# Patient Record
Sex: Female | Born: 1945 | Race: White | Hispanic: No | Smoking: Former smoker
Health system: Southern US, Community
[De-identification: ages and names within clinical notes are randomized; demographics above are authoritative.]

## PROBLEM LIST (undated history)

## (undated) DIAGNOSIS — K589 Irritable bowel syndrome without diarrhea: Secondary | ICD-10-CM

## (undated) DIAGNOSIS — F419 Anxiety disorder, unspecified: Secondary | ICD-10-CM

## (undated) DIAGNOSIS — H269 Unspecified cataract: Secondary | ICD-10-CM

## (undated) DIAGNOSIS — I452 Bifascicular block: Secondary | ICD-10-CM

## (undated) DIAGNOSIS — I471 Supraventricular tachycardia: Secondary | ICD-10-CM

## (undated) DIAGNOSIS — J329 Chronic sinusitis, unspecified: Secondary | ICD-10-CM

## (undated) DIAGNOSIS — I214 Non-ST elevation (NSTEMI) myocardial infarction: Secondary | ICD-10-CM

## (undated) DIAGNOSIS — E669 Obesity, unspecified: Secondary | ICD-10-CM

## (undated) DIAGNOSIS — F329 Major depressive disorder, single episode, unspecified: Secondary | ICD-10-CM

## (undated) DIAGNOSIS — I5181 Takotsubo syndrome: Secondary | ICD-10-CM

## (undated) DIAGNOSIS — I4719 Other supraventricular tachycardia: Secondary | ICD-10-CM

## (undated) DIAGNOSIS — E785 Hyperlipidemia, unspecified: Secondary | ICD-10-CM

## (undated) DIAGNOSIS — C50919 Malignant neoplasm of unspecified site of unspecified female breast: Secondary | ICD-10-CM

## (undated) DIAGNOSIS — I10 Essential (primary) hypertension: Secondary | ICD-10-CM

## (undated) DIAGNOSIS — K635 Polyp of colon: Secondary | ICD-10-CM

## (undated) DIAGNOSIS — F32A Depression, unspecified: Secondary | ICD-10-CM

## (undated) DIAGNOSIS — I272 Pulmonary hypertension, unspecified: Secondary | ICD-10-CM

## (undated) DIAGNOSIS — G4700 Insomnia, unspecified: Secondary | ICD-10-CM

## (undated) HISTORY — DX: Pulmonary hypertension, unspecified: I27.20

## (undated) HISTORY — DX: Hyperlipidemia, unspecified: E78.5

## (undated) HISTORY — DX: Anxiety disorder, unspecified: F41.9

## (undated) HISTORY — DX: Other supraventricular tachycardia: I47.19

## (undated) HISTORY — PX: UPPER GASTROINTESTINAL ENDOSCOPY: SHX188

## (undated) HISTORY — DX: Irritable bowel syndrome, unspecified: K58.9

## (undated) HISTORY — DX: Obesity, unspecified: E66.9

## (undated) HISTORY — PX: ABDOMINAL HYSTERECTOMY: SHX81

## (undated) HISTORY — DX: Chronic sinusitis, unspecified: J32.9

## (undated) HISTORY — PX: BREAST LUMPECTOMY: SHX2

## (undated) HISTORY — DX: Unspecified cataract: H26.9

## (undated) HISTORY — DX: Supraventricular tachycardia: I47.1

## (undated) HISTORY — PX: SHOULDER SURGERY: SHX246

## (undated) HISTORY — DX: Bifascicular block: I45.2

## (undated) HISTORY — DX: Insomnia, unspecified: G47.00

## (undated) HISTORY — DX: Takotsubo syndrome: I51.81

## (undated) HISTORY — DX: Polyp of colon: K63.5

## (undated) HISTORY — DX: Malignant neoplasm of unspecified site of unspecified female breast: C50.919

---

## 1998-09-17 ENCOUNTER — Other Ambulatory Visit: Admission: RE | Admit: 1998-09-17 | Discharge: 1998-09-17 | Payer: Self-pay | Admitting: *Deleted

## 1999-12-30 ENCOUNTER — Other Ambulatory Visit: Admission: RE | Admit: 1999-12-30 | Discharge: 1999-12-30 | Payer: Self-pay | Admitting: *Deleted

## 2000-06-16 ENCOUNTER — Ambulatory Visit (HOSPITAL_COMMUNITY): Admission: RE | Admit: 2000-06-16 | Discharge: 2000-06-16 | Payer: Self-pay | Admitting: Gastroenterology

## 2000-10-12 DIAGNOSIS — C50919 Malignant neoplasm of unspecified site of unspecified female breast: Secondary | ICD-10-CM

## 2000-10-12 HISTORY — DX: Malignant neoplasm of unspecified site of unspecified female breast: C50.919

## 2000-11-03 ENCOUNTER — Other Ambulatory Visit: Admission: RE | Admit: 2000-11-03 | Discharge: 2000-11-03 | Payer: Self-pay | Admitting: Radiology

## 2000-11-03 ENCOUNTER — Encounter (INDEPENDENT_AMBULATORY_CARE_PROVIDER_SITE_OTHER): Payer: Self-pay

## 2000-11-09 ENCOUNTER — Ambulatory Visit (HOSPITAL_BASED_OUTPATIENT_CLINIC_OR_DEPARTMENT_OTHER): Admission: RE | Admit: 2000-11-09 | Discharge: 2000-11-09 | Payer: Self-pay | Admitting: *Deleted

## 2000-11-09 ENCOUNTER — Encounter (INDEPENDENT_AMBULATORY_CARE_PROVIDER_SITE_OTHER): Payer: Self-pay | Admitting: *Deleted

## 2000-11-09 ENCOUNTER — Encounter: Admission: RE | Admit: 2000-11-09 | Discharge: 2000-11-09 | Payer: Self-pay | Admitting: *Deleted

## 2000-11-17 ENCOUNTER — Ambulatory Visit (HOSPITAL_COMMUNITY): Admission: RE | Admit: 2000-11-17 | Discharge: 2000-11-17 | Payer: Self-pay | Admitting: *Deleted

## 2000-11-19 ENCOUNTER — Ambulatory Visit (HOSPITAL_BASED_OUTPATIENT_CLINIC_OR_DEPARTMENT_OTHER): Admission: RE | Admit: 2000-11-19 | Discharge: 2000-11-19 | Payer: Self-pay | Admitting: *Deleted

## 2000-11-19 ENCOUNTER — Encounter (INDEPENDENT_AMBULATORY_CARE_PROVIDER_SITE_OTHER): Payer: Self-pay | Admitting: Specialist

## 2000-11-24 ENCOUNTER — Encounter: Admission: RE | Admit: 2000-11-24 | Discharge: 2001-02-22 | Payer: Self-pay | Admitting: Radiation Oncology

## 2001-03-07 ENCOUNTER — Ambulatory Visit: Admission: RE | Admit: 2001-03-07 | Discharge: 2001-06-05 | Payer: Self-pay | Admitting: Radiation Oncology

## 2001-06-22 ENCOUNTER — Other Ambulatory Visit: Admission: RE | Admit: 2001-06-22 | Discharge: 2001-06-22 | Payer: Self-pay | Admitting: Radiology

## 2002-10-12 DIAGNOSIS — K635 Polyp of colon: Secondary | ICD-10-CM

## 2002-10-12 HISTORY — DX: Polyp of colon: K63.5

## 2005-01-20 ENCOUNTER — Ambulatory Visit: Payer: Self-pay | Admitting: Oncology

## 2005-02-26 ENCOUNTER — Encounter: Admission: RE | Admit: 2005-02-26 | Discharge: 2005-02-26 | Payer: Self-pay | Admitting: Oncology

## 2006-01-19 ENCOUNTER — Ambulatory Visit: Payer: Self-pay | Admitting: Oncology

## 2006-01-19 LAB — COMPREHENSIVE METABOLIC PANEL
ALT: 12 U/L (ref 0–40)
AST: 18 U/L (ref 0–37)
Albumin: 4 g/dL (ref 3.5–5.2)
Alkaline Phosphatase: 67 U/L (ref 39–117)
BUN: 17 mg/dL (ref 6–23)
CO2: 28 mEq/L (ref 19–32)
Calcium: 9.6 mg/dL (ref 8.4–10.5)
Chloride: 108 mEq/L (ref 96–112)
Creatinine, Ser: 0.8 mg/dL (ref 0.4–1.2)
Glucose, Bld: 98 mg/dL (ref 70–99)
Potassium: 3.7 mEq/L (ref 3.5–5.3)
Sodium: 145 mEq/L (ref 135–145)
Total Bilirubin: 0.5 mg/dL (ref 0.3–1.2)
Total Protein: 6.6 g/dL (ref 6.0–8.3)

## 2006-01-19 LAB — CBC WITH DIFFERENTIAL/PLATELET
BASO%: 1.2 % (ref 0.0–2.0)
Basophils Absolute: 0.1 10*3/uL (ref 0.0–0.1)
EOS%: 3.5 % (ref 0.0–7.0)
Eosinophils Absolute: 0.3 10*3/uL (ref 0.0–0.5)
HCT: 39.9 % (ref 34.8–46.6)
HGB: 13.5 g/dL (ref 11.6–15.9)
LYMPH%: 28.8 % (ref 14.0–48.0)
MCH: 28.5 pg (ref 26.0–34.0)
MCHC: 33.8 g/dL (ref 32.0–36.0)
MCV: 84.5 fL (ref 81.0–101.0)
MONO#: 0.5 10*3/uL (ref 0.1–0.9)
MONO%: 6.8 % (ref 0.0–13.0)
NEUT#: 4.8 10*3/uL (ref 1.5–6.5)
NEUT%: 59.7 % (ref 39.6–76.8)
Platelets: 342 10*3/uL (ref 145–400)
RBC: 4.72 10*6/uL (ref 3.70–5.32)
RDW: 13.4 % (ref 11.3–14.5)
WBC: 8 10*3/uL (ref 3.9–10.0)
lymph#: 2.3 10*3/uL (ref 0.9–3.3)

## 2007-01-13 ENCOUNTER — Ambulatory Visit: Payer: Self-pay | Admitting: Oncology

## 2007-01-18 LAB — CBC WITH DIFFERENTIAL/PLATELET
BASO%: 0.4 % (ref 0.0–2.0)
Basophils Absolute: 0 10*3/uL (ref 0.0–0.1)
EOS%: 4.3 % (ref 0.0–7.0)
Eosinophils Absolute: 0.3 10*3/uL (ref 0.0–0.5)
HCT: 38.7 % (ref 34.8–46.6)
HGB: 13.5 g/dL (ref 11.6–15.9)
LYMPH%: 36.6 % (ref 14.0–48.0)
MCH: 28.7 pg (ref 26.0–34.0)
MCHC: 34.8 g/dL (ref 32.0–36.0)
MCV: 82.6 fL (ref 81.0–101.0)
MONO#: 0.5 10*3/uL (ref 0.1–0.9)
MONO%: 6.1 % (ref 0.0–13.0)
NEUT#: 4.1 10*3/uL (ref 1.5–6.5)
NEUT%: 52.6 % (ref 39.6–76.8)
Platelets: 309 10*3/uL (ref 145–400)
RBC: 4.69 10*6/uL (ref 3.70–5.32)
RDW: 13.2 % (ref 11.3–14.5)
WBC: 7.9 10*3/uL (ref 3.9–10.0)
lymph#: 2.9 10*3/uL (ref 0.9–3.3)

## 2007-01-18 LAB — COMPREHENSIVE METABOLIC PANEL
ALT: 15 U/L (ref 0–35)
AST: 18 U/L (ref 0–37)
Albumin: 3.8 g/dL (ref 3.5–5.2)
Alkaline Phosphatase: 70 U/L (ref 39–117)
BUN: 21 mg/dL (ref 6–23)
CO2: 27 mEq/L (ref 19–32)
Calcium: 9 mg/dL (ref 8.4–10.5)
Chloride: 107 mEq/L (ref 96–112)
Creatinine, Ser: 0.85 mg/dL (ref 0.40–1.20)
Glucose, Bld: 99 mg/dL (ref 70–99)
Potassium: 3.7 mEq/L (ref 3.5–5.3)
Sodium: 144 mEq/L (ref 135–145)
Total Bilirubin: 0.5 mg/dL (ref 0.3–1.2)
Total Protein: 6 g/dL (ref 6.0–8.3)

## 2007-01-18 LAB — CANCER ANTIGEN 27.29: CA 27.29: 30 U/mL (ref 0–39)

## 2007-09-06 ENCOUNTER — Ambulatory Visit: Payer: Self-pay | Admitting: Gastroenterology

## 2007-09-12 ENCOUNTER — Ambulatory Visit: Payer: Self-pay | Admitting: Gastroenterology

## 2010-08-11 ENCOUNTER — Encounter (INDEPENDENT_AMBULATORY_CARE_PROVIDER_SITE_OTHER): Payer: Self-pay | Admitting: *Deleted

## 2010-09-01 ENCOUNTER — Telehealth: Payer: Self-pay | Admitting: Gastroenterology

## 2010-09-01 ENCOUNTER — Encounter (INDEPENDENT_AMBULATORY_CARE_PROVIDER_SITE_OTHER): Payer: Self-pay

## 2010-09-02 ENCOUNTER — Ambulatory Visit: Payer: Self-pay | Admitting: Gastroenterology

## 2010-11-04 ENCOUNTER — Ambulatory Visit
Admission: RE | Admit: 2010-11-04 | Discharge: 2010-11-04 | Payer: Self-pay | Source: Home / Self Care | Attending: Gastroenterology | Admitting: Gastroenterology

## 2010-11-04 ENCOUNTER — Encounter: Payer: Self-pay | Admitting: Gastroenterology

## 2010-11-13 NOTE — Letter (Signed)
Summary: [facility] Instructions  [facility]  [address] [place], [place] [address]   Phone: [phone]  Fax: [phone]       Valerie Nash    04/03/1946    MRN: [id number]        Procedure Day /Date: Monday 09-22-10     Arrival Time: 10:30 a.m.     Procedure Time: 11:30 a.m.     Location of Procedure:                    _x _  [facility] (4th Floor)   PREPARATION FOR COLONOSCOPY WITH MOVIPREP   Starting 5 days prior to your procedure  09-17-10  do not eat nuts, seeds, popcorn, corn, beans, peas,  salads, or any raw vegetables.  Do not take any fiber supplements (e.g. Metamucil, Citrucel, and Benefiber).  THE DAY BEFORE YOUR PROCEDURE         DATE:  09-21-10  DAY:  Sunday  1.  Drink clear liquids the entire day-NO SOLID FOOD  2.  Do not drink anything colored red or purple.  Avoid juices with pulp.  No orange juice.  3.  Drink at least 64 oz. (8 glasses) of fluid/clear liquids during the day to prevent dehydration and help the prep work efficiently.  CLEAR LIQUIDS INCLUDE: Water Jello Ice Popsicles Tea (sugar ok, no milk/cream) Powdered fruit flavored drinks Coffee (sugar ok, no milk/cream) Gatorade Juice: apple, white grape, white cranberry  Lemonade Clear bullion, consomm, broth Carbonated beverages (any kind) Strained chicken noodle soup Hard Candy                             4.  In the morning, mix first dose of MoviPrep solution:    Empty 1 Pouch A and 1 Pouch B into the disposable container    Add lukewarm drinking water to the top line of the container. Mix to dissolve    Refrigerate (mixed solution should be used within 24 hrs)  5.  Begin drinking the prep at 5:00 p.m. The MoviPrep container is divided by 4 marks.   Every 15 minutes drink the solution down to the next mark (approximately 8 oz) until the full liter is complete.   6.  Follow completed prep with 16 oz of clear liquid of your choice (Nothing red or  purple).  Continue to drink clear liquids until bedtime.  7.  Before going to bed, mix second dose of MoviPrep solution:    Empty 1 Pouch A and 1 Pouch B into the disposable container    Add lukewarm drinking water to the top line of the container. Mix to dissolve    Refrigerate  THE DAY OF YOUR PROCEDURE      DATE:  09-22-10  DAY:  Monday  Beginning at  6:30 a.m. (5 hours before procedure):         1. Every 15 minutes, drink the solution down to the next mark (approx 8 oz) until the full liter is complete.  2. Follow completed prep with 16 oz. of clear liquid of your choice.    3. You may drink clear liquids until  9:30 a.m. (2 HOURS BEFORE PROCEDURE).   MEDICATION INSTRUCTIONS  Unless otherwise instructed, you should take regular prescription medications with a small sip of water   as early as possible the morning of your procedure.  Additional medication instructions: Do not take Benicar am of  procedure.         OTHER INSTRUCTIONS  You will need a responsible adult at least 65 years of age to accompany you and drive you home.   This person must remain in the waiting room during your procedure.  Wear loose fitting clothing that is easily removed.  Leave jewelry and other valuables at home.  However, you may wish to bring a book to read or  an iPod/MP3 player to listen to music as you wait for your procedure to start.  Remove all body piercing jewelry and leave at home.  Total time from sign-in until discharge is approximately 2-3 hours.  You should go home directly after your procedure and rest.  You can resume normal activities the  day after your procedure.  The day of your procedure you should not:   Drive   Make legal decisions   Operate machinery   Drink alcohol   Return to work  You will receive specific instructions about eating, activities and medications before you leave.    The above instructions have been reviewed and explained to me by    Ulis Rias RN  September 02, 2010 2:11 PM     I fully understand and can verbalize these instructions _____________________________ Date _________

## 2010-11-13 NOTE — Letter (Signed)
Summary: Pre Visit Letter Revised  [facility]  [address] [place], [place] [address]   Phone: [phone]  Fax: [phone]        08/11/2010 MRN: [id number] [facility] [address] [place], [place]  [address]             Procedure Date:  09/22/2010   Welcome to the Gastroenterology Division at [facility].    You are scheduled to see a nurse for your pre-procedure visit on 09/02/2010 at 2:00PM on the 3rd floor at [facility], [address]  We ask that you try to arrive at our office 15 minutes prior to your appointment time to allow for check-in.  Please take a minute to review the attached form.  If you answer "Yes" to one or more of the questions on the first page, we ask that you call the person listed at your earliest opportunity.  If you answer "No" to all of the questions, please complete the rest of the form and bring it to your appointment.    Your nurse visit will consist of discussing your medical and surgical history, your immediate family medical history, and your medications.   If you are unable to list all of your medications on the form, please bring the medication bottles to your appointment and we will list them.  We will need to be aware of both prescribed and over the counter drugs.  We will need to know exact dosage information as well.    Please be prepared to read and sign documents such as consent forms, a financial agreement, and acknowledgement forms.  If necessary, and with your consent, a friend or relative is welcome to sit-in on the nurse visit with you.  Please bring your insurance card so that we may make a copy of it.  If your insurance requires a referral to see a specialist, please bring your referral form from your primary care physician.  No co-pay is required for this nurse visit.     If you cannot keep your appointment, please call [phone] to cancel or reschedule prior to your appointment date.  This  allows [place] the opportunity to schedule an appointment for another patient in need of care.    Thank you for choosing [facility] for your medical needs.  We appreciate the opportunity to care for you.  Please visit [place] at our website  to learn more about our practice.  Sincerely, The Gastroenterology Division

## 2010-11-13 NOTE — Progress Notes (Signed)
Summary: ? Need for additional prep  Phone Note Call from Patient   Summary of Call: Patient for previsit tomorrow for her colonoscopy on 09-22-10, per 2008 report she had osmo prep with fair results, you recommended repeat colon in 2011 with better bowel prep. She will recieve movi prep instructions, do you want anything else added? Initial call taken by: Sherren Kerns RN,  September 01, 2010 12:24 PM  Follow-up for Phone Call        MoviPrep should be adequate. Follow-up by: Meryl Dare MD Clementeen Graham,  September 01, 2010 1:59 PM

## 2010-11-13 NOTE — Miscellaneous (Signed)
Summary: Lec previsit  Clinical Lists Changes  Medications: Added new medication of MOVIPREP 100 GM  SOLR (PEG-KCL-NACL-NASULF-NA ASC-C) As per prep instructions. - Signed Rx of MOVIPREP 100 GM  SOLR (PEG-KCL-NACL-NASULF-NA ASC-C) As per prep instructions.;  #1 x 0;  Signed;  Entered by: Ulis Rias RN;  Authorized by: Meryl Dare MD [facility];  Method used: Electronically to [facility] [phone]*, [address] [place], [place], [place]  [address], Ph: [phone], Fax: [phone] Observations: Added new observation of NKA: T (09/02/2010 13:49)    Prescriptions: MOVIPREP 100 GM  SOLR (PEG-KCL-NACL-NASULF-NA ASC-C) As per prep instructions.  #1 x 0   Entered by:   Ulis Rias RN   Authorized by:   Meryl Dare MD [facility]   Signed by:   Ulis Rias RN on 09/02/2010   Method used:   Electronically to        [facility] [phone][facility] (retail)       [address]       [place], [place]  [address]       Ph: [phone]       Fax: [phone]   RxID:   [id number]

## 2010-11-13 NOTE — Procedures (Signed)
Summary: Colonoscopy  Patient: Valerie Nash Note: All result statuses are Final unless otherwise noted.  Tests: (1) Colonoscopy (COL)   COL Colonoscopy           DONE     [facility]     [address]     [place], [place]  [address]           COLONOSCOPY PROCEDURE REPORT           PATIENT:  Valerie Nash, Valerie Nash  MR#:  [id number]     BIRTHDATE:  1946-02-04, 64 yrs. old  GENDER:  female     ENDOSCOPIST:  Judie Petit T. Russella Dar, MD, [facility]           PROCEDURE DATE:  11/04/2010     PROCEDURE:  Colonoscopy [address]     ASA CLASS:  Class II     INDICATIONS:  1) surveillance and high-risk screening  2) history     of adenomatous colon polyps-06/2003  3) family history of colon     cancer-father at 18.     MEDICATIONS:   Fentanyl 75 mcg IV, Versed 8 mg IV     DESCRIPTION OF PROCEDURE:   After the risks benefits and     alternatives of the procedure were thoroughly explained, informed     consent was obtained.  Digital rectal exam was performed and     revealed no abnormalities.   The LB PCF-H180AL [id number] endoscope     was introduced through the anus and advanced to the cecum, which     was identified by both the appendix and ileocecal valve, without     limitations.  The quality of the prep was good, using MoviPrep.     The instrument was then slowly withdrawn as the colon was fully     examined.     <<PROCEDUREIMAGES>>     FINDINGS:  A normal appearing cecum, ileocecal valve, and     appendiceal orifice were identified. The ascending, hepatic     flexure, transverse, splenic flexure, descending, sigmoid colon,     and rectum appeared unremarkable. Retroflexed views in the rectum     revealed no abnormalities. The time to cecum =  5.75  minutes. The     scope was then withdrawn (time =  10.5  min) from the patient and     the procedure completed.           COMPLICATIONS:  None           ENDOSCOPIC IMPRESSION:     1) Normal colon           RECOMMENDATIONS:     1) Repeat Colonoscopy in 5  years.           Venita Lick. Russella Dar, MD, [facility]           CC:  R. Robley Fries, MD           n.     Rosalie DoctorJudie Petit T. Kairee Kozma at 11/04/2010 03:30 PM           Gethers, Windell Moulding, [phone]  Note: An exclamation mark (!) indicates a result that was not dispersed into the flowsheet. Document Creation Date: 11/04/2010 3:31 PM _______________________________________________________________________  (1) Order result status: Final Collection or observation date-time: 11/04/2010 15:23 Requested date-time:  Receipt date-time:  Reported date-time:  Referring Physician:   Ordering Physician: Claudette Head [phone]) Specimen Source:  Source: Launa Grill Order Number: [id number] Lab site:   Appended Document: Colonoscopy  Clinical Lists Changes  Observations: Added new observation of COLONNXTDUE: 10/2015 (11/04/2010 16:04)

## 2011-02-27 NOTE — Op Note (Signed)
[facility]  Patient:    Valerie Nash, Valerie Nash                        MRN: [id number] Proc. Date: 11/19/00 Adm. Date:  [id number] Disc. Date: [id number] Attending:  Stephenie Acres                           Operative Report  PREOPERATIVE DIAGNOSIS:  Ductal carcinoma in situ of the right breast, status post excision with positive margins.  POSTOPERATIVE DIAGNOSIS:  Ductal carcinoma in situ of the right breast, status post excision with positive margins.  PROCEDURE:  Re-excision of right partial mastectomy.  ANESTHESIA:  General.  SURGEON:  Catalina Lunger, M.D.  DESCRIPTION OF PROCEDURE:  The patient was taken to the operating room and placed in the supine position.  After adequate anesthesia was induced using laryngeal mask, the right breast was prepped and draped in the normal sterile fashion.  Using an elliptical incision around the previous incision, I dissected down through the dermis and the subcutaneous tissue.  My dissection began laterally out far beyond the biopsy cavity, approximately 1 cm.  The biopsy cavity was then entered and a portion of the superolateral and inferolateral margins were also excised en bloc.  This entire specimen was sent for pathologic evaluation.  Adequate hemostasis was ensured and the skin was closed with subcuticular 4-0 Monocryl.  Steri-Strips and a sterile dressing was applied.  The patient tolerated the procedure well and went to PACU in good condition. DD:  11/19/00 TD:  11/21/00 Job: [id number]

## 2011-02-27 NOTE — Procedures (Signed)
[facility]  Patient:    Valerie Nash, Valerie Nash                        MRN: [id number] Proc. Date: 06/16/00 Adm. Date:  [id number] Attending:  Starr Sinclair CC:         Almedia Balls. Randell Patient, M.D.   Procedure Report  PROCEDURE:  Colonoscopy.  ENDOSCOPIST:  Venita Lick. Pleas Koch., M.D.  INDICATIONS:  This is a 66 year old white female with a prior history of an adenomatous colon polyp and a father who developed colon cancer.  She has no ongoing colorectal complaints and returns for surveillance colonoscopy.  PHYSICAL EXAMINATION:  Chest:  Clear to auscultation and percussion.  Cardiac: Regular rate and rhythm without murmurs.  Neurologic:  Alert and oriented x3.  ANESTHESIA:  Fentanyl 75 mcg IV, Versed 7 mg IV.  MONITORING:  Automated blood pressure monitor, pulse oximeter and cardiac monitor.  Low-flow oxygen was given by nasal cannula throughout the procedure. The procedure was well tolerated with no immediate complications.  DESCRIPTION OF PROCEDURE:  After the nature of the procedure was discussed with the patient including discussion of its risks, benefits and alternatives, she consented to proceed.  She was then comfortably sedated in the left lateral decubitus position.  Digital rectal examination was unremarkable.  The Olympus pediatric video colonoscope was inserted in the rectal vault.  Area was insufflated and the colonoscope was advanced to the cecum.  The cecum was identified by the appendiceal orifice and the ileocecal valve orifice.  There were multiple areas of retained turbid liquid stool in the colon.  The bowel preparation was overall fair.  Most of these areas were adequately suctioned. On slow withdrawal of the colonoscope, the visualize portions cecum, ascending colon, hepatic flexure, transverse colon, splenic flexure,  and descending colon were unremarkable.  In the sigmoid colon, there were several small diverticula noted.  In the  rectum, small internal hemorrhoids were noted on retroflexed view.  The proximal rectum was normal.  The colon was decompressed and colonoscope was withdrawn from the patient.  IMPRESSION: 1. Colonoscopy to cecum. 2. Mild sigmoid colon diverticulosis. 3. Small internal hemorrhoids. 4. Fair bowel preparation.  RECOMMENDATIONS: 1. Followup colonoscopy in five years. 2. Ongoing followup with Dr. Jeanine Luz. DD:  06/16/00 TD:  06/16/00 Job: [id number]

## 2011-02-27 NOTE — Op Note (Signed)
. [facility]  Patient:    Valerie Nash, Valerie Nash                        MRN: [id number] Proc. Date: 11/09/00 Adm. Date:  [id number] Attending:  Stephenie Acres                           Operative Report  PREOPERATIVE DIAGNOSIS:  Ductal carcinoma in situ of the right breast.  POSTOPERATIVE DIAGNOSIS:  Ductal carcinoma in situ of the right breast.  PROCEDURE:  Needle-localization right partial mastectomy.  SURGEON:  Catalina Lunger, M.D.  ANESTHESIA:  General.  DESCRIPTION OF PROCEDURE:  After patient underwent needle localization of calcification in the right breast, she was brought to the operating room and given general anesthesia via endotracheal tube.  Her right breast was then prepped and draped in the normal sterile fashion.  Using a curvilinear incision right at the periareolar region from 3 to 7 oclock, dissected down through subcutaneous tissue until I easily could palpate the tip of the wire. All tissue surrounding the wire was taken back to its entrance through the skin.  This was removed en bloc, sent for specimen mammography, which verified the presence of the calcifications.  All margins had been marked.  Tissues were injected using Marcaine.  Skin was closed using subcuticular 4-0 Monocryl.  Steri-Strips and sterile dressing was applied.  The patient tolerated the procedure well, went to PACU in good condition. DD:  11/09/00 TD:  11/09/00 Job: [id number]

## 2012-01-19 DIAGNOSIS — G2581 Restless legs syndrome: Secondary | ICD-10-CM | POA: Diagnosis not present

## 2012-01-19 DIAGNOSIS — F411 Generalized anxiety disorder: Secondary | ICD-10-CM | POA: Diagnosis not present

## 2012-01-19 DIAGNOSIS — E669 Obesity, unspecified: Secondary | ICD-10-CM | POA: Diagnosis not present

## 2012-01-19 DIAGNOSIS — Z0000 Encounter for general adult medical examination without abnormal findings: Secondary | ICD-10-CM | POA: Diagnosis not present

## 2012-01-19 DIAGNOSIS — Z79899 Other long term (current) drug therapy: Secondary | ICD-10-CM | POA: Diagnosis not present

## 2012-01-19 DIAGNOSIS — J019 Acute sinusitis, unspecified: Secondary | ICD-10-CM | POA: Diagnosis not present

## 2012-01-19 DIAGNOSIS — I10 Essential (primary) hypertension: Secondary | ICD-10-CM | POA: Diagnosis not present

## 2012-01-19 DIAGNOSIS — G4700 Insomnia, unspecified: Secondary | ICD-10-CM | POA: Diagnosis not present

## 2012-02-03 DIAGNOSIS — E8941 Symptomatic postprocedural ovarian failure: Secondary | ICD-10-CM | POA: Diagnosis not present

## 2012-02-03 DIAGNOSIS — Z79899 Other long term (current) drug therapy: Secondary | ICD-10-CM | POA: Diagnosis not present

## 2012-03-14 DIAGNOSIS — Z1231 Encounter for screening mammogram for malignant neoplasm of breast: Secondary | ICD-10-CM | POA: Diagnosis not present

## 2012-03-22 DIAGNOSIS — Z85828 Personal history of other malignant neoplasm of skin: Secondary | ICD-10-CM | POA: Diagnosis not present

## 2012-03-22 DIAGNOSIS — C44711 Basal cell carcinoma of skin of unspecified lower limb, including hip: Secondary | ICD-10-CM | POA: Diagnosis not present

## 2012-03-22 DIAGNOSIS — Z808 Family history of malignant neoplasm of other organs or systems: Secondary | ICD-10-CM | POA: Diagnosis not present

## 2012-03-22 DIAGNOSIS — D239 Other benign neoplasm of skin, unspecified: Secondary | ICD-10-CM | POA: Diagnosis not present

## 2012-03-22 DIAGNOSIS — L819 Disorder of pigmentation, unspecified: Secondary | ICD-10-CM | POA: Diagnosis not present

## 2012-03-22 DIAGNOSIS — L821 Other seborrheic keratosis: Secondary | ICD-10-CM | POA: Diagnosis not present

## 2012-03-22 DIAGNOSIS — D485 Neoplasm of uncertain behavior of skin: Secondary | ICD-10-CM | POA: Diagnosis not present

## 2012-03-22 DIAGNOSIS — D1801 Hemangioma of skin and subcutaneous tissue: Secondary | ICD-10-CM | POA: Diagnosis not present

## 2012-04-19 DIAGNOSIS — C44711 Basal cell carcinoma of skin of unspecified lower limb, including hip: Secondary | ICD-10-CM | POA: Diagnosis not present

## 2012-08-11 DIAGNOSIS — S82009A Unspecified fracture of unspecified patella, initial encounter for closed fracture: Secondary | ICD-10-CM | POA: Diagnosis not present

## 2012-08-24 DIAGNOSIS — S82009A Unspecified fracture of unspecified patella, initial encounter for closed fracture: Secondary | ICD-10-CM | POA: Diagnosis not present

## 2012-09-26 DIAGNOSIS — S82009A Unspecified fracture of unspecified patella, initial encounter for closed fracture: Secondary | ICD-10-CM | POA: Diagnosis not present

## 2012-12-10 DIAGNOSIS — I5181 Takotsubo syndrome: Secondary | ICD-10-CM

## 2012-12-10 HISTORY — DX: Takotsubo syndrome: I51.81

## 2012-12-23 ENCOUNTER — Encounter (HOSPITAL_BASED_OUTPATIENT_CLINIC_OR_DEPARTMENT_OTHER): Payer: Self-pay | Admitting: *Deleted

## 2012-12-23 ENCOUNTER — Inpatient Hospital Stay (HOSPITAL_BASED_OUTPATIENT_CLINIC_OR_DEPARTMENT_OTHER)
Admission: EM | Admit: 2012-12-23 | Discharge: 2012-12-26 | DRG: 281 | Disposition: A | Discharge status: Home / Self Care | Payer: Medicare Other | Attending: Cardiology | Admitting: Cardiology

## 2012-12-23 ENCOUNTER — Emergency Department (HOSPITAL_BASED_OUTPATIENT_CLINIC_OR_DEPARTMENT_OTHER): Payer: Medicare Other

## 2012-12-23 DIAGNOSIS — I951 Orthostatic hypotension: Secondary | ICD-10-CM | POA: Diagnosis not present

## 2012-12-23 DIAGNOSIS — R072 Precordial pain: Secondary | ICD-10-CM | POA: Diagnosis not present

## 2012-12-23 DIAGNOSIS — I5181 Takotsubo syndrome: Secondary | ICD-10-CM | POA: Diagnosis not present

## 2012-12-23 DIAGNOSIS — I10 Essential (primary) hypertension: Secondary | ICD-10-CM | POA: Diagnosis present

## 2012-12-23 DIAGNOSIS — F3289 Other specified depressive episodes: Secondary | ICD-10-CM | POA: Diagnosis present

## 2012-12-23 DIAGNOSIS — Z87891 Personal history of nicotine dependence: Secondary | ICD-10-CM | POA: Diagnosis not present

## 2012-12-23 DIAGNOSIS — I214 Non-ST elevation (NSTEMI) myocardial infarction: Secondary | ICD-10-CM | POA: Diagnosis not present

## 2012-12-23 DIAGNOSIS — I4510 Unspecified right bundle-branch block: Secondary | ICD-10-CM | POA: Diagnosis present

## 2012-12-23 DIAGNOSIS — R079 Chest pain, unspecified: Secondary | ICD-10-CM | POA: Diagnosis not present

## 2012-12-23 DIAGNOSIS — F329 Major depressive disorder, single episode, unspecified: Secondary | ICD-10-CM | POA: Diagnosis present

## 2012-12-23 HISTORY — DX: Depression, unspecified: F32.A

## 2012-12-23 HISTORY — DX: Major depressive disorder, single episode, unspecified: F32.9

## 2012-12-23 HISTORY — DX: Essential (primary) hypertension: I10

## 2012-12-23 LAB — BASIC METABOLIC PANEL
BUN: 14 mg/dL (ref 6–23)
CO2: 25 mEq/L (ref 19–32)
Calcium: 11.3 mg/dL — ABNORMAL HIGH (ref 8.4–10.5)
Chloride: 103 mEq/L (ref 96–112)
Creatinine, Ser: 0.7 mg/dL (ref 0.50–1.10)
GFR calc Af Amer: >90 mL/min (ref >90)
GFR calc non Af Amer: 88 mL/min — ABNORMAL LOW (ref >90)
Glucose, Bld: 127 mg/dL — ABNORMAL HIGH (ref 70–99)
Potassium: 2.9 mEq/L — ABNORMAL LOW (ref 3.5–5.1)
Sodium: 141 mEq/L (ref 135–145)

## 2012-12-23 LAB — CK TOTAL AND CKMB (NOT AT ARMC)
CK, MB: 6.4 ng/mL (ref 0.3–4.0)
Relative Index: 5.9 — ABNORMAL HIGH (ref 0.0–2.5)
Total CK: 109 U/L (ref 7–177)

## 2012-12-23 LAB — CBC WITH DIFFERENTIAL/PLATELET
Basophils Absolute: 0 10*3/uL (ref 0.0–0.1)
Basophils Relative: 0 % (ref 0–1)
Eosinophils Absolute: 0.1 10*3/uL (ref 0.0–0.7)
Eosinophils Relative: 1 % (ref 0–5)
HCT: 45 % (ref 36.0–46.0)
Hemoglobin: 15.5 g/dL — ABNORMAL HIGH (ref 12.0–15.0)
Lymphocytes Relative: 26 % (ref 12–46)
Lymphs Abs: 3 10*3/uL (ref 0.7–4.0)
MCH: 28.1 pg (ref 26.0–34.0)
MCHC: 34.4 g/dL (ref 30.0–36.0)
MCV: 81.7 fL (ref 78.0–100.0)
Monocytes Absolute: 0.7 10*3/uL (ref 0.1–1.0)
Monocytes Relative: 6 % (ref 3–12)
Neutro Abs: 7.6 10*3/uL (ref 1.7–7.7)
Neutrophils Relative %: 66 % (ref 43–77)
Platelets: 329 10*3/uL (ref 150–400)
RBC: 5.51 MIL/uL — ABNORMAL HIGH (ref 3.87–5.11)
RDW: 14 % (ref 11.5–15.5)
WBC: 11.5 10*3/uL — ABNORMAL HIGH (ref 4.0–10.5)

## 2012-12-23 LAB — URINALYSIS, ROUTINE W REFLEX MICROSCOPIC
Bilirubin Urine: NEGATIVE
Glucose, UA: NEGATIVE mg/dL
Ketones, ur: NEGATIVE mg/dL
Leukocytes, UA: NEGATIVE
Nitrite: NEGATIVE
Protein, ur: NEGATIVE mg/dL
Specific Gravity, Urine: 1.016 (ref 1.005–1.030)
Urobilinogen, UA: 0.2 mg/dL (ref 0.0–1.0)
pH: 6.5 (ref 5.0–8.0)

## 2012-12-23 LAB — URINE MICROSCOPIC-ADD ON

## 2012-12-23 LAB — TROPONIN I
Troponin I: 0.9 ng/mL (ref <0.30)
Troponin I: 0.9 ng/mL (ref <0.30)
Troponin I: 0.71 ng/mL (ref <0.30)

## 2012-12-23 MED ORDER — CLOPIDOGREL BISULFATE 300 MG PO TABS
600.0000 mg | ORAL_TABLET | Freq: Once | ORAL | Status: AC
  Administered 2012-12-23: 600 mg via ORAL
  Filled 2012-12-23: qty 2

## 2012-12-23 MED ORDER — ASPIRIN EC 81 MG PO TBEC
81.0000 mg | DELAYED_RELEASE_TABLET | Freq: Every day | ORAL | Status: DC
  Administered 2012-12-24 – 2012-12-25 (×2): 81 mg via ORAL
  Filled 2012-12-23 (×3): qty 1

## 2012-12-23 MED ORDER — NITROGLYCERIN 0.4 MG SL SUBL: 0.4000 mg | SUBLINGUAL_TABLET | SUBLINGUAL | Status: DC | PRN

## 2012-12-23 MED ORDER — HEPARIN (PORCINE) IN NACL 100-0.45 UNIT/ML-% IJ SOLN: 1000.0000 [IU]/h | INTRAMUSCULAR | Status: DC

## 2012-12-23 MED ORDER — SODIUM CHLORIDE 0.9 % IV SOLN
Freq: Once | INTRAVENOUS | Status: AC
  Administered 2012-12-23: 18:00:00 via INTRAVENOUS

## 2012-12-23 MED ORDER — ACETAMINOPHEN 325 MG PO TABS
650.0000 mg | ORAL_TABLET | ORAL | Status: DC | PRN
  Administered 2012-12-24 (×2): 650 mg via ORAL
  Filled 2012-12-23 (×2): qty 2

## 2012-12-23 MED ORDER — HEPARIN (PORCINE) IN NACL 100-0.45 UNIT/ML-% IJ SOLN
900.0000 [IU]/h | INTRAMUSCULAR | Status: DC
  Administered 2012-12-23: 1000 [IU]/h via INTRAVENOUS
  Administered 2012-12-24 – 2012-12-25 (×3): 900 [IU]/h via INTRAVENOUS
  Filled 2012-12-23 (×5): qty 250

## 2012-12-23 MED ORDER — ONDANSETRON HCL 4 MG/2ML IJ SOLN: 4.0000 mg | Freq: Four times a day (QID) | INTRAMUSCULAR | Status: DC | PRN

## 2012-12-23 MED ORDER — NITROGLYCERIN IN D5W 200-5 MCG/ML-% IV SOLN: 5.0000 ug/min | Freq: Once | INTRAVENOUS | Status: DC

## 2012-12-23 MED ORDER — SODIUM CHLORIDE 0.9 % IV SOLN: INTRAVENOUS | Status: AC

## 2012-12-23 MED ORDER — METOPROLOL TARTRATE 12.5 MG HALF TABLET
12.5000 mg | ORAL_TABLET | Freq: Two times a day (BID) | ORAL | Status: DC
  Administered 2012-12-23: 12.5 mg via ORAL
  Filled 2012-12-23 (×3): qty 1

## 2012-12-23 MED ORDER — ASPIRIN 81 MG PO CHEW
324.0000 mg | CHEWABLE_TABLET | Freq: Once | ORAL | Status: AC
  Administered 2012-12-23: 324 mg via ORAL
  Filled 2012-12-23: qty 4

## 2012-12-23 MED ORDER — NITROGLYCERIN IN D5W 200-5 MCG/ML-% IV SOLN
5.0000 ug/min | INTRAVENOUS | Status: DC
Start: 2012-12-23 — End: 2012-12-23
  Administered 2012-12-23: 5 ug/min via INTRAVENOUS
  Filled 2012-12-23: qty 250

## 2012-12-23 MED ORDER — ATORVASTATIN CALCIUM 80 MG PO TABS
80.0000 mg | ORAL_TABLET | Freq: Every day | ORAL | Status: DC
  Filled 2012-12-23 (×3): qty 1

## 2012-12-23 MED ORDER — HEPARIN BOLUS VIA INFUSION
4000.0000 [IU] | Freq: Once | INTRAVENOUS | Status: AC
  Administered 2012-12-23: 4000 [IU] via INTRAVENOUS

## 2012-12-23 MED ORDER — SODIUM CHLORIDE 0.9 % IV SOLN
INTRAVENOUS | Status: AC
  Administered 2012-12-24: 50 mL/h via INTRAVENOUS

## 2012-12-23 MED ORDER — VENLAFAXINE HCL 75 MG PO TABS
75.0000 mg | ORAL_TABLET | Freq: Every day | ORAL | Status: DC
  Administered 2012-12-24 – 2012-12-25 (×2): 75 mg via ORAL
  Filled 2012-12-23 (×3): qty 1

## 2012-12-23 MED ORDER — CLOPIDOGREL BISULFATE 75 MG PO TABS
75.0000 mg | ORAL_TABLET | Freq: Every day | ORAL | Status: DC
  Administered 2012-12-24 – 2012-12-26 (×3): 75 mg via ORAL
  Filled 2012-12-23 (×4): qty 1

## 2012-12-23 NOTE — ED Notes (Signed)
Report given to [facility], RN [facility] Room 2021.

## 2012-12-23 NOTE — ED Provider Notes (Signed)
History     CSN: [id number]  Arrival date & time 12/23/12  1555   First MD Initiated Contact with Patient 12/23/12 1613      Chief Complaint  Patient presents with  . Chest Pain    (Consider location/radiation/quality/duration/timing/severity/associated sxs/prior treatment) Patient is a 67 y.o. female presenting with chest pain. The history is provided by the patient. No language interpreter was used.  Chest Pain Pain location:  Substernal area (Pt went to an water exercise class yesterday evening and had onset of pain in the precordial region.  It is like a pressure, rated at a 3 in severity.  It is somewhat worse when she lies down.  The pain persisted all night and through the day,.) Pain quality: pressure   Pain radiates to:  Does not radiate Pain severity:  Mild Onset quality:  Sudden Duration: Steadily present since last evening. Timing:  Constant Progression:  Unchanged Chronicity:  New Context comment:  Came on during a water exercise class. Relieved by:  Nothing Worsened by:  Nothing tried Ineffective treatments:  None tried Associated symptoms: no fever, no palpitations and no shortness of breath   Risk factors: hypertension   Risk factors comment:  She has a known right bundle branch block.   Past Medical History  Diagnosis Date  . Depression   . Hypertension   . BBB (bundle branch block)   . Cancer     Past Surgical History  Procedure Laterality Date  . Shoulder surgery    . Abdominal hysterectomy    . Cesarean section    . Breast lumpectomy      No family history on file.  History  Substance Use Topics  . Smoking status: Former Games developer  . Smokeless tobacco: Not on file  . Alcohol Use: Yes    OB History   Grav Para Term Preterm Abortions TAB SAB Ect Mult Living                  Review of Systems  Constitutional: Negative.  Negative for fever and chills.  HENT: Negative.   Eyes: Negative.   Respiratory: Negative.  Negative for shortness  of breath.   Cardiovascular: Positive for chest pain. Negative for palpitations and leg swelling.  Genitourinary: Negative.   Musculoskeletal: Negative.   Skin: Negative.   Neurological: Negative.   Psychiatric/Behavioral: Negative.     Allergies  Review of patient's allergies indicates no known allergies.  Home Medications   Current Outpatient Rx  Name  Route  Sig  Dispense  Refill  . olmesartan-hydrochlorothiazide (BENICAR HCT) 20-12.5 MG per tablet   Oral   Take 1 tablet by mouth daily.         Marland Kitchen venlafaxine (EFFEXOR) 75 MG tablet   Oral   Take 75 mg by mouth 2 (two) times daily.           BP 133/80  Pulse 104  Temp(Src) 98.1 F (36.7 C) (Oral)  Resp 22  Wt 155 lb (70.308 kg)  SpO2 100%  Physical Exam  Nursing note and vitals reviewed. Constitutional: She is oriented to person, place, and time. She appears well-developed and well-nourished. No distress.  Appears mildly anxious.  HENT:  Head: Normocephalic and atraumatic.  Right Ear: External ear normal.  Left Ear: External ear normal.  Mouth/Throat: Oropharynx is clear and moist.  Eyes: Conjunctivae and EOM are normal. Pupils are equal, round, and reactive to light.  Neck: Normal range of motion. Neck supple.  Cardiovascular: Normal rate,  regular rhythm and normal heart sounds.   Pulmonary/Chest: Effort normal and breath sounds normal.  Abdominal: Soft. Bowel sounds are normal.  Musculoskeletal: Normal range of motion. She exhibits no edema and no tenderness.  Neurological: She is alert and oriented to person, place, and time.  No sensory or motor deficit.  Skin: Skin is warm and dry.  Psychiatric: She has a normal mood and affect. Her behavior is normal.    ED Course  Procedures (including critical care time)  4:20 PM  Date: 12/23/2012  Rate: 85  Rhythm: normal sinus rhythm  QRS Axis: left  Intervals: normal  ST/T Wave abnormalities:  T waves inverted in medial anterior precordial leads.   Conduction Disutrbances:right bundle branch block and left anterior fascicular block  Narrative Interpretation: Abnormal EKG.  Old EKG Reviewed: none available  4:54 PM Pt seen --> physical exam performed.  EKG shows RBBB and LAFB.  Lab workup ordered.  5:27 PM Results for orders placed during the hospital encounter of 12/23/12  CBC WITH DIFFERENTIAL      Result Value Range   WBC 11.5 (*) 4.0 - 10.5 K/uL   RBC 5.51 (*) 3.87 - 5.11 MIL/uL   Hemoglobin 15.5 (*) 12.0 - 15.0 g/dL   HCT 14.7  82.9 - 56.2 %   MCV 81.7  78.0 - 100.0 fL   MCH 28.1  26.0 - 34.0 pg   MCHC 34.4  30.0 - 36.0 g/dL   RDW 13.0  86.5 - 78.4 %   Platelets 329  150 - 400 K/uL   Neutrophils Relative 66  43 - 77 %   Neutro Abs 7.6  1.7 - 7.7 K/uL   Lymphocytes Relative 26  12 - 46 %   Lymphs Abs 3.0  0.7 - 4.0 K/uL   Monocytes Relative 6  3 - 12 %   Monocytes Absolute 0.7  0.1 - 1.0 K/uL   Eosinophils Relative 1  0 - 5 %   Eosinophils Absolute 0.1  0.0 - 0.7 K/uL   Basophils Relative 0  0 - 1 %   Basophils Absolute 0.0  0.0 - 0.1 K/uL  BASIC METABOLIC PANEL      Result Value Range   Sodium 141  135 - 145 mEq/L   Potassium 2.9 (*) 3.5 - 5.1 mEq/L   Chloride 103  96 - 112 mEq/L   CO2 25  19 - 32 mEq/L   Glucose, Bld 127 (*) 70 - 99 mg/dL   BUN 14  6 - 23 mg/dL   Creatinine, Ser 6.96  0.50 - 1.10 mg/dL   Calcium 29.5 (*) 8.4 - 10.5 mg/dL   GFR calc non Af Amer 88 (*) >90 mL/min   GFR calc Af Amer >90  >90 mL/min  TROPONIN I      Result Value Range   Troponin I 0.90 (*) <0.30 ng/mL   No results found.  Troponin I is elevated at 0.9.  Case discussed with Dr. Armanda Magic, [facility] cardiologist, who requested repeat Troponin I and CKMB, and advised starting IV NTG and IV heparin.   6:24 PM CKMB high at 6.4.  Troponin I repeated is 0.898, critically high.  Call back to Dr. Armanda Magic --> She accepts pt in transfer to [facility] to a telemetry bed.     1. NSTEMI (non-ST elevated myocardial  infarction)         Carleene Cooper III, MD 12/23/12 1843

## 2012-12-23 NOTE — H&P (Signed)
Cardiology History and Physical  GATES,ROBERT NEVILL, MD  History of Present Illness (and review of medical records): Valerie Nash is a 67 y.o. female who presents for evaluation of chest pain.  She states she initally developed mid sternal chest discomfort on Thursday aroudn 5pm after a water aerobics calls.  Pain was 2/10.  Pain persisted overnight and she had difficulty sleeping.  She continued to have pain on Friday, but mainly came into ED due to new headache and nausea.  She denied any shortness of breath or diaphoresis.  She received ASA, NTG and is now chest pain free.  Initial troponin was positive with RBBB on ecg.  She was accepted for admission and transferred for further management.  Previous diagnostic testing for coronary artery disease includes: none. Previous history of cardiac disease includes None. Coronary artery disease risk factors include: advanced age (older than 79 for men, 49 for women), hypertension and smoking/ tobacco exposure. Patient denies history of angina, CHF, ischemic heart disease and previous M.I..  Review of Systems Further review of systems was otherwise negative other than stated in HPI.  Patient Active Problem List   Diagnosis Date Noted  . NSTEMI (non-ST elevated myocardial infarction) 12/23/2012   Past Medical History  Diagnosis Date  . Depression   . Hypertension   . BBB (bundle branch block)   . Cancer     Past Surgical History  Procedure Laterality Date  . Shoulder surgery    . Abdominal hysterectomy    . Cesarean section    . Breast lumpectomy      Prescriptions prior to admission  Medication Sig Dispense Refill  . aspirin EC 81 MG tablet Take 81 mg by mouth at bedtime.      . Biotin 5000 MCG CAPS Take 5,000 mcg by mouth daily.      Marland Kitchen CALCIUM-VITAMIN D PO Take 1 tablet by mouth daily.      . fish oil-omega-3 fatty acids 1000 MG capsule Take 1,000 mg by mouth daily.      . Multiple Vitamin (MULTIVITAMIN WITH MINERALS) TABS Take 1  tablet by mouth daily.      Marland Kitchen olmesartan-hydrochlorothiazide (BENICAR HCT) 20-12.5 MG per tablet Take 1 tablet by mouth See admin instructions. Every day except Tuesdays and Thursdays (has been skipping due to low blood pressure)      . venlafaxine (EFFEXOR) 75 MG tablet Take 75 mg by mouth daily.       No Known Allergies  History  Substance Use Topics  . Smoking status: Former Games developer  . Smokeless tobacco: Not on file  . Alcohol Use: Yes    History reviewed. No pertinent family history.   Objective: Patient Vitals for the past 8 hrs:  BP Temp Temp src Pulse Resp SpO2  12/24/12 0555 91/53 mmHg 97.9 F (36.6 C) Oral 66 18 99 %   General Appearance:    Alert, cooperative, no distress, appears stated age  Head:    Normocephalic, without obvious abnormality, atraumatic  Eyes:     PERRL, EOMI, anicteric sclerae  Neck:   Supple, no carotid bruit or JVD  Lungs:     Clear to auscultation bilaterally, respirations unlabored  Heart:    Regular rate and rhythm, S1 and S2 normal, no murmur  Abdomen:     Soft, non-tender, normoactive bowel sounds  Extremities:   Extremities normal, atraumatic, no cyanosis or edema  Pulses:   2+ and symmetric all extremities  Skin:   no rashes or lesions  Neurologic:   No focal deficits. AAO x3   Results for orders placed during the hospital encounter of 12/23/12 (from the past 48 hour(s))  CBC WITH DIFFERENTIAL     Status: Abnormal   Collection Time    12/23/12  4:41 PM      Result Value Range   WBC 11.5 (*) 4.0 - 10.5 K/uL   RBC 5.51 (*) 3.87 - 5.11 MIL/uL   Hemoglobin 15.5 (*) 12.0 - 15.0 g/dL   HCT 16.1  09.6 - 04.5 %   MCV 81.7  78.0 - 100.0 fL   MCH 28.1  26.0 - 34.0 pg   MCHC 34.4  30.0 - 36.0 g/dL   RDW 40.9  81.1 - 91.4 %   Platelets 329  150 - 400 K/uL   Neutrophils Relative 66  43 - 77 %   Neutro Abs 7.6  1.7 - 7.7 K/uL   Lymphocytes Relative 26  12 - 46 %   Lymphs Abs 3.0  0.7 - 4.0 K/uL   Monocytes Relative 6  3 - 12 %   Monocytes  Absolute 0.7  0.1 - 1.0 K/uL   Eosinophils Relative 1  0 - 5 %   Eosinophils Absolute 0.1  0.0 - 0.7 K/uL   Basophils Relative 0  0 - 1 %   Basophils Absolute 0.0  0.0 - 0.1 K/uL  BASIC METABOLIC PANEL     Status: Abnormal   Collection Time    12/23/12  4:41 PM      Result Value Range   Sodium 141  135 - 145 mEq/L   Potassium 2.9 (*) 3.5 - 5.1 mEq/L   Chloride 103  96 - 112 mEq/L   CO2 25  19 - 32 mEq/L   Glucose, Bld 127 (*) 70 - 99 mg/dL   BUN 14  6 - 23 mg/dL   Creatinine, Ser 7.82  0.50 - 1.10 mg/dL   Calcium 95.6 (*) 8.4 - 10.5 mg/dL   GFR calc non Af Amer 88 (*) >90 mL/min   GFR calc Af Amer >90  >90 mL/min   Comment:            The eGFR has been calculated     using the CKD EPI equation.     This calculation has not been     validated in all clinical     situations.     eGFR's persistently     <90 mL/min signify     possible Chronic Kidney Disease.  TROPONIN I     Status: Abnormal   Collection Time    12/23/12  4:41 PM      Result Value Range   Troponin I 0.90 (*) <0.30 ng/mL   Comment:            Due to the release kinetics of cTnI,     a negative result within the first hours     of the onset of symptoms does not rule out     myocardial infarction with certainty.     If myocardial infarction is still suspected,     repeat the test at appropriate intervals.     CRITICAL RESULT CALLED TO, READ BACK BY AND VERIFIED WITH:     AMY BURNS RN AT 1512 12/23/12 BY I.[email or web address]  URINALYSIS, ROUTINE W REFLEX MICROSCOPIC     Status: Abnormal   Collection Time    12/23/12  5:03 PM      Result Value Range   Color,  Urine YELLOW  YELLOW   APPearance CLEAR  CLEAR   Specific Gravity, Urine 1.016  1.005 - 1.030   pH 6.5  5.0 - 8.0   Glucose, UA NEGATIVE  NEGATIVE mg/dL   Hgb urine dipstick SMALL (*) NEGATIVE   Bilirubin Urine NEGATIVE  NEGATIVE   Ketones, ur NEGATIVE  NEGATIVE mg/dL   Protein, ur NEGATIVE  NEGATIVE mg/dL   Urobilinogen, UA 0.2  0.0 - 1.0 mg/dL   Nitrite  NEGATIVE  NEGATIVE   Leukocytes, UA NEGATIVE  NEGATIVE  URINE MICROSCOPIC-ADD ON     Status: None   Collection Time    12/23/12  5:03 PM      Result Value Range   Squamous Epithelial / LPF RARE  RARE   RBC / HPF 3-6  <3 RBC/hpf   Bacteria, UA RARE  RARE  TROPONIN I     Status: Abnormal   Collection Time    12/23/12  5:38 PM      Result Value Range   Troponin I 0.90 (*) <0.30 ng/mL   Comment:            Due to the release kinetics of cTnI,     a negative result within the first hours     of the onset of symptoms does not rule out     myocardial infarction with certainty.     If myocardial infarction is still suspected,     repeat the test at appropriate intervals.     CRITICAL RESULT CALLED TO, READ BACK BY AND VERIFIED WITH:     AMY BURNS @ 1824 M OLSON  CK TOTAL AND CKMB     Status: Abnormal   Collection Time    12/23/12  5:38 PM      Result Value Range   Total CK 109  7 - 177 U/L   CK, MB 6.4 (*) 0.3 - 4.0 ng/mL   Comment: CRITICAL RESULT CALLED TO, READ BACK BY AND VERIFIED WITH:     AMY BURNS @ 1824 M OLSON   Relative Index 5.9 (*) 0.0 - 2.5  TROPONIN I     Status: Abnormal   Collection Time    12/23/12  9:44 PM      Result Value Range   Troponin I 0.71 (*) <0.30 ng/mL   Comment:            Due to the release kinetics of cTnI,     a negative result within the first hours     of the onset of symptoms does not rule out     myocardial infarction with certainty.     If myocardial infarction is still suspected,     repeat the test at appropriate intervals.     CRITICAL RESULT CALLED TO, READ BACK BY AND VERIFIED WITH:     LEWIS,C RN 12/23/2012 2238 [address]  HEPARIN LEVEL (UNFRACTIONATED)     Status: Abnormal   Collection Time    12/24/12 12:30 AM      Result Value Range   Heparin Unfractionated 0.85 (*) 0.30 - 0.70 IU/mL   Comment:            IF HEPARIN RESULTS ARE BELOW     EXPECTED VALUES, AND PATIENT     DOSAGE HAS BEEN CONFIRMED,     SUGGEST FOLLOW UP TESTING      OF ANTITHROMBIN III LEVELS.  CBC     Status: Abnormal   Collection Time    12/24/12  3:35  AM      Result Value Range   WBC 13.6 (*) 4.0 - 10.5 K/uL   RBC 5.16 (*) 3.87 - 5.11 MIL/uL   Hemoglobin 14.4  12.0 - 15.0 g/dL   HCT 40.9  81.1 - 91.4 %   MCV 82.0  78.0 - 100.0 fL   MCH 27.9  26.0 - 34.0 pg   MCHC 34.0  30.0 - 36.0 g/dL   RDW 78.2  95.6 - 21.3 %   Platelets 291  150 - 400 K/uL  TROPONIN I     Status: Abnormal   Collection Time    12/24/12  3:35 AM      Result Value Range   Troponin I 0.46 (*) <0.30 ng/mL   Comment:            Due to the release kinetics of cTnI,     a negative result within the first hours     of the onset of symptoms does not rule out     myocardial infarction with certainty.     If myocardial infarction is still suspected,     repeat the test at appropriate intervals.     CRITICAL VALUE NOTED.  VALUE IS CONSISTENT WITH PREVIOUSLY REPORTED AND CALLED VALUE.  MAGNESIUM     Status: None   Collection Time    12/24/12  3:35 AM      Result Value Range   Magnesium 2.0  1.5 - 2.5 mg/dL  LIPID PANEL     Status: None   Collection Time    12/24/12  3:35 AM      Result Value Range   Cholesterol 184  0 - 200 mg/dL   Triglycerides 086  <578 mg/dL   HDL 61  >46 mg/dL   Total CHOL/HDL Ratio 3.0     VLDL 28  0 - 40 mg/dL   LDL Cholesterol 95  0 - 99 mg/dL   Comment:            Total Cholesterol/HDL:CHD Risk     Coronary Heart Disease Risk Table                         Men   Women      1/2 Average Risk   3.4   3.3      Average Risk       5.0   4.4      2 X Average Risk   9.6   7.1      3 X Average Risk  23.4   11.0                Use the calculated Patient Ratio     above and the CHD Risk Table     to determine the patient's CHD Risk.                ATP III CLASSIFICATION (LDL):      <100     mg/dL   Optimal      962-952  mg/dL   Near or Above                        Optimal      130-159  mg/dL   Borderline      841-324  mg/dL   High      >401      mg/dL   Very High   [facility] [address]  12/23/2012  *RADIOLOGY REPORT*  Clinical Data: Chest pain.  PORTABLE CHEST - 1 VIEW  Comparison: None  Findings: The cardiomediastinal silhouette is unremarkable. The lungs are clear. There is no evidence of focal airspace disease, pulmonary edema, suspicious pulmonary nodule/mass, pleural effusion, or pneumothorax. No acute bony abnormalities are identified.  IMPRESSION: No evidence of active cardiopulmonary disease.   Original Report Authenticated By: Harmon Pier, M.D.     ECG:  Sinus rhythm with RBBB  Assessment: 74F hx of HTN, prior tobacco exposure, no known CAD or prior MI, presents with chest pain x 2 days, ecg with RBBB reportedly chronic, and positive troponin likely due to NSTEMI/ACS.  Plan:  1. Admit to Cardiology 2. Continuous monitoring on Telemetry. 3. Repeat ekg on admit, prn chest pain or arrythmia 4. Trend cardiac biomarkers, check lipids, hgba1c, tsh 5. Medical management to include ASA, Plavix, Heparin gtt, BB, Statin, NTG prn 6. Will likely need further ischemic evaluation with cardiac catheterization.

## 2012-12-23 NOTE — ED Notes (Signed)
Assigned to bed 2021 @ [facility], RN Notified,[facility] called for transport.

## 2012-12-23 NOTE — ED Notes (Signed)
[facility] preparing pt for transport

## 2012-12-23 NOTE — ED Notes (Signed)
Chest pressure in the center of her chest. Nausea. Pain started after water exercise class yesterday.

## 2012-12-23 NOTE — ED Notes (Signed)
Report given to Bridgett Larsson RN

## 2012-12-23 NOTE — Progress Notes (Addendum)
CRITICAL VALUE ALERT  Critical value received:  Troponin 0.71  Date of notification:  12/23/12  Time of notification:  2235  Critical value read back:yes  Nurse who received alert:  France Ravens  MD notified (1st page):  Dr. Terressa Koyanagi  Time of first page:  2243  MD notified (2nd page):  Time of second page:  Responding MD:  Dr. Terressa Koyanagi  Time MD responded:  2244

## 2012-12-23 NOTE — Progress Notes (Addendum)
ANTICOAGULATION CONSULT NOTE - Initial Consult  Pharmacy Consult for Heparin Indication: NSTEMI  No Known Allergies  Patient Measurements: Weight: 155 lb (70.308 kg) Heparin Dosing Weight: 70 kg  Vital Signs: Temp: 98.1 F (36.7 C) (03/14 1604) Temp src: Oral (03/14 1604) BP: 112/81 mmHg (03/14 1915) Pulse Rate: 78 (03/14 1915)  Labs:  Recent Labs  12/23/12 1641 12/23/12 1738  HGB 15.5*  --   HCT 45.0  --   PLT 329  --   CREATININE 0.70  --   CKTOTAL  --  109  CKMB  --  6.4*  TROPONINI 0.90* 0.90*    CrCl is unknown because there is no height on file for the current visit.   Medical History: Past Medical History  Diagnosis Date  . Depression   . Hypertension   . BBB (bundle branch block)   . Cancer     Medications:  Scheduled:  . [COMPLETED] sodium chloride   Intravenous Once  . [COMPLETED] heparin  4,000 Units Intravenous Once    Assessment: 67 yo female transferred from [facility] with chest pain, elevated troponin.  Started on IV heparin with bolus of 4000 units x 1, then gtt at 1000 units/hr at 1800 PM.  Baseline labs WNL.  Spoke with patient, no anticoagulants PTA.  Goal of Therapy:  Heparin level 0.3-0.7 units/ml Monitor platelets by anticoagulation protocol: Yes   Plan:  1. Continue  IV heparin at current rate. 2. Check heparin level at midnight tonight. 3. Daily heparin level and CBC. 4. F/U plans.  Tad Moore, BCPS  Clinical Pharmacist Pager [phone]  12/23/2012 9:14 PM

## 2012-12-24 DIAGNOSIS — I951 Orthostatic hypotension: Secondary | ICD-10-CM | POA: Diagnosis not present

## 2012-12-24 DIAGNOSIS — I214 Non-ST elevation (NSTEMI) myocardial infarction: Secondary | ICD-10-CM | POA: Diagnosis not present

## 2012-12-24 DIAGNOSIS — I10 Essential (primary) hypertension: Secondary | ICD-10-CM | POA: Diagnosis present

## 2012-12-24 LAB — HEMOGLOBIN A1C
Hgb A1c MFr Bld: 5.8 % — ABNORMAL HIGH (ref <5.7)
Mean Plasma Glucose: 120 mg/dL — ABNORMAL HIGH (ref <117)

## 2012-12-24 LAB — CBC
HCT: 42.3 % (ref 36.0–46.0)
Hemoglobin: 14.4 g/dL (ref 12.0–15.0)
MCH: 27.9 pg (ref 26.0–34.0)
MCHC: 34 g/dL (ref 30.0–36.0)
MCV: 82 fL (ref 78.0–100.0)
Platelets: 291 10*3/uL (ref 150–400)
RBC: 5.16 MIL/uL — ABNORMAL HIGH (ref 3.87–5.11)
RDW: 13.7 % (ref 11.5–15.5)
WBC: 13.6 10*3/uL — ABNORMAL HIGH (ref 4.0–10.5)

## 2012-12-24 LAB — BASIC METABOLIC PANEL
BUN: 17 mg/dL (ref 6–23)
CO2: 24 mEq/L (ref 19–32)
Calcium: 8.9 mg/dL (ref 8.4–10.5)
Chloride: 107 mEq/L (ref 96–112)
Creatinine, Ser: 0.68 mg/dL (ref 0.50–1.10)
GFR calc Af Amer: >90 mL/min (ref >90)
GFR calc non Af Amer: 89 mL/min — ABNORMAL LOW (ref >90)
Glucose, Bld: 122 mg/dL — ABNORMAL HIGH (ref 70–99)
Potassium: 3.5 mEq/L (ref 3.5–5.1)
Sodium: 141 mEq/L (ref 135–145)

## 2012-12-24 LAB — LIPID PANEL
Cholesterol: 184 mg/dL (ref 0–200)
HDL: 61 mg/dL (ref >39)
LDL Cholesterol: 95 mg/dL (ref 0–99)
Total CHOL/HDL Ratio: 3 RATIO
Triglycerides: 140 mg/dL (ref <150)
VLDL: 28 mg/dL (ref 0–40)

## 2012-12-24 LAB — TSH: TSH: 2.85 u[IU]/mL (ref 0.350–4.500)

## 2012-12-24 LAB — MAGNESIUM: Magnesium: 2 mg/dL (ref 1.5–2.5)

## 2012-12-24 LAB — HEPARIN LEVEL (UNFRACTIONATED)
Heparin Unfractionated: 0.59 IU/mL (ref 0.30–0.70)
Heparin Unfractionated: 0.6 IU/mL (ref 0.30–0.70)
Heparin Unfractionated: 0.85 IU/mL — ABNORMAL HIGH (ref 0.30–0.70)

## 2012-12-24 LAB — TROPONIN I: Troponin I: 0.46 ng/mL (ref <0.30)

## 2012-12-24 MED ORDER — POTASSIUM CHLORIDE CRYS ER 20 MEQ PO TBCR
EXTENDED_RELEASE_TABLET | ORAL | Status: AC
  Administered 2012-12-24: 40 meq via ORAL
  Filled 2012-12-24: qty 2

## 2012-12-24 MED ORDER — POTASSIUM CHLORIDE CRYS ER 20 MEQ PO TBCR: 40.0000 meq | EXTENDED_RELEASE_TABLET | Freq: Once | ORAL | Status: AC

## 2012-12-24 NOTE — Progress Notes (Signed)
Pt refusing to take Lipitor at this time; pt educated on reason for medication at this time; pt still refused to take; pt would like to discuss this matter with MD in AM; will cont. To monitor.

## 2012-12-24 NOTE — Progress Notes (Signed)
  Echocardiogram 2D Echocardiogram has been performed.  Georgian Co 12/24/2012, 3:16 PM

## 2012-12-24 NOTE — Progress Notes (Signed)
BP 74/55; Dr. Mayford Knife in room to see pt at this time; manual BP 84/60; order to given NS bolus; bolus infusing at this time; pt asymptomatic; plan for pt to have cardiac cath on Monday; pt made aware; will cont. To monitor.

## 2012-12-24 NOTE — Progress Notes (Signed)
ANTICOAGULATION CONSULT NOTE - Follow Up Consult  Pharmacy Consult for heparin Indication: NSTEMI  Labs:  Recent Labs  12/23/12 1641 12/23/12 1738 12/23/12 2144 12/24/12 0030 12/24/12 0335 12/24/12 0744  HGB 15.5*  --   --   --  14.4  --   HCT 45.0  --   --   --  42.3  --   PLT 329  --   --   --  291  --   HEPARINUNFRC  --   --   --  0.85*  --  0.60  CREATININE 0.70  --   --   --   --  0.68  CKTOTAL  --  109  --   --   --   --   CKMB  --  6.4*  --   --   --   --   TROPONINI 0.90* 0.90* 0.71*  --  0.46*  --     Assessment: 67yo female therapeutic on heparin for NSTEMI.  Goal of Therapy:  Heparin level 0.3-0.7 units/ml   Plan:  Cont heparin gtt at 900 units/hr Recheck HL at 16:00 to confirm  Talbert Cage, PharmD 12/24/2012,12:32 PM

## 2012-12-24 NOTE — Progress Notes (Signed)
ANTICOAGULATION CONSULT NOTE - Follow Up Consult  Pharmacy Consult for heparin Indication: NSTEMI  Labs:  Recent Labs  12/23/12 1641 12/23/12 1738 12/23/12 2144 12/24/12 0030  HGB 15.5*  --   --   --   HCT 45.0  --   --   --   PLT 329  --   --   --   HEPARINUNFRC  --   --   --  0.85*  CREATININE 0.70  --   --   --   CKTOTAL  --  109  --   --   CKMB  --  6.4*  --   --   TROPONINI 0.90* 0.90* 0.71*  --     Assessment: 66yo female supratherapeutic on heparin with initial dosing for NSTEMI.  Goal of Therapy:  Heparin level 0.3-0.7 units/ml   Plan:  Will decrease heparin gtt by ~1 unit/kg/hr to 900 units/hr and check level in 6hr.  Vernard Gambles, PharmD, BCPS  12/24/2012,1:31 AM

## 2012-12-24 NOTE — Progress Notes (Signed)
Pt admitted last evening, noted during chart review that patient potassium was 2.9 at [facility]. MD on call notified and gave order to replace potassium with 40 meq PO and to get a BMET in the am.

## 2012-12-24 NOTE — Progress Notes (Signed)
ANTICOAGULATION CONSULT NOTE - Follow Up Consult  Pharmacy Consult for heparin Indication: NSTEMI  Labs:  Recent Labs  12/23/12 1641 12/23/12 1738 12/23/12 2144 12/24/12 0030 12/24/12 0335 12/24/12 0744 12/24/12 1616  HGB 15.5*  --   --   --  14.4  --   --   HCT 45.0  --   --   --  42.3  --   --   PLT 329  --   --   --  291  --   --   HEPARINUNFRC  --   --   --  0.85*  --  0.60 0.59  CREATININE 0.70  --   --   --   --  0.68  --   CKTOTAL  --  109  --   --   --   --   --   CKMB  --  6.4*  --   --   --   --   --   TROPONINI 0.90* 0.90* 0.71*  --  0.46*  --   --     Assessment: 67yo female therapeutic on heparin for NSTEMI.  Goal of Therapy:  Heparin level 0.3-0.7 units/ml   Plan:  Cont heparin gtt at 900 units/hr Daily heparin level and CBC in AM  Celedonio Miyamoto, PharmD, [facility] Clinical Pharmacist Pager [phone]   12/24/2012,5:01 PM

## 2012-12-24 NOTE — Progress Notes (Signed)
SUBJECTIVE:  No further chest pain  OBJECTIVE:   Vitals:   Filed Vitals:   12/23/12 1915 12/23/12 2117 12/24/12 0555 12/24/12 0619  BP: 112/81 118/88 91/53 94/52   Pulse: 78 99 66   Temp:  98.6 F (37 C) 97.9 F (36.6 C)   TempSrc:  Oral Oral   Resp: 16 16 18    Height:  5\' 4"  (1.626 m)    Weight:  69.4 kg (153 lb)    SpO2: 99% 95% 99%    I&O's:  No intake or output data in the 24 hours ending 12/24/12 0938 TELEMETRY: Reviewed telemetry pt in NSR:     PHYSICAL EXAM General: Well developed, well nourished, in no acute distress Head: Eyes PERRLA, No xanthomas.   Normal cephalic and atramatic  Lungs:   Clear bilaterally to auscultation and percussion. Heart:   HRRR S1 S2 Pulses are 2+ & equal. Abdomen: Bowel sounds are positive, abdomen soft and non-tender without masses  Extremities:   No clubbing, cyanosis or edema.  DP +1 Neuro: Alert and oriented X 3. Psych:  Good affect, responds appropriately   LABS: Basic Metabolic Panel:  Recent Labs  27/03/50 1641 12/24/12 0335 12/24/12 0744  NA 141  --  141  K 2.9*  --  3.5  CL 103  --  107  CO2 25  --  24  GLUCOSE 127*  --  122*  BUN 14  --  17  CREATININE 0.70  --  0.68  CALCIUM 11.3*  --  8.9  MG  --  2.0  --    Liver Function Tests: No results found for this basename: AST, ALT, ALKPHOS, BILITOT, PROT, ALBUMIN,  in the last 72 hours No results found for this basename: LIPASE, AMYLASE,  in the last 72 hours CBC:  Recent Labs  12/23/12 1641 12/24/12 0335  WBC 11.5* 13.6*  NEUTROABS 7.6  --   HGB 15.5* 14.4  HCT 45.0 42.3  MCV 81.7 82.0  PLT 329 291   Cardiac Enzymes:  Recent Labs  12/23/12 1641 12/23/12 1738 12/23/12 2144 12/24/12 0335  CKTOTAL  --  109  --   --   CKMB  --  6.4*  --   --   TROPONINI 0.90* 0.90* 0.71* 0.46*    Recent Labs  12/24/12 0335  CHOL 184  HDL 61  LDLCALC 95  TRIG 140  CHOLHDL 3.0     RADIOLOGY: Dg Chest Port 1 View  12/23/2012  *RADIOLOGY REPORT*  Clinical  Data: Chest pain.  PORTABLE CHEST - 1 VIEW  Comparison: None  Findings: The cardiomediastinal silhouette is unremarkable. The lungs are clear. There is no evidence of focal airspace disease, pulmonary edema, suspicious pulmonary nodule/mass, pleural effusion, or pneumothorax. No acute bony abnormalities are identified.  IMPRESSION: No evidence of active cardiopulmonary disease.   Original Report Authenticated By: Harmon Pier, M.D.       ASSESSMENT:  1.  NSTEMI with no further chest pain 2.  HTN 3.  Remote tobacco use 4.  RBBB 5.  Hypotension with SBP  PLAN:   1.  Continue ASA/statin/Plavix 2.  Stop beta blocker secondary to hypotension 3.  IV fluid bolus 250cc 4.  Plan cath Monday  Quintella Reichert, MD  12/24/2012  9:38 AM

## 2012-12-25 DIAGNOSIS — I214 Non-ST elevation (NSTEMI) myocardial infarction: Secondary | ICD-10-CM | POA: Diagnosis not present

## 2012-12-25 DIAGNOSIS — I10 Essential (primary) hypertension: Secondary | ICD-10-CM | POA: Diagnosis not present

## 2012-12-25 LAB — CBC
HCT: 38.8 % (ref 36.0–46.0)
HCT: 38.2 % (ref 36.0–46.0)
Hemoglobin: 13 g/dL (ref 12.0–15.0)
Hemoglobin: 13.1 g/dL (ref 12.0–15.0)
MCH: 28.1 pg (ref 26.0–34.0)
MCH: 28.7 pg (ref 26.0–34.0)
MCHC: 33.5 g/dL (ref 30.0–36.0)
MCHC: 34.3 g/dL (ref 30.0–36.0)
MCV: 83.8 fL (ref 78.0–100.0)
MCV: 83.6 fL (ref 78.0–100.0)
Platelets: 252 10*3/uL (ref 150–400)
Platelets: 263 10*3/uL (ref 150–400)
RBC: 4.63 MIL/uL (ref 3.87–5.11)
RBC: 4.57 MIL/uL (ref 3.87–5.11)
RDW: 14.2 % (ref 11.5–15.5)
RDW: 14.2 % (ref 11.5–15.5)
WBC: 10.5 10*3/uL (ref 4.0–10.5)
WBC: 9.8 10*3/uL (ref 4.0–10.5)

## 2012-12-25 LAB — BASIC METABOLIC PANEL
BUN: 13 mg/dL (ref 6–23)
CO2: 18 mEq/L — ABNORMAL LOW (ref 19–32)
Calcium: 9 mg/dL (ref 8.4–10.5)
Chloride: 108 mEq/L (ref 96–112)
Creatinine, Ser: 0.68 mg/dL (ref 0.50–1.10)
GFR calc Af Amer: >90 mL/min (ref >90)
GFR calc non Af Amer: 89 mL/min — ABNORMAL LOW (ref >90)
Glucose, Bld: 87 mg/dL (ref 70–99)
Potassium: 4 mEq/L (ref 3.5–5.1)
Sodium: 138 mEq/L (ref 135–145)

## 2012-12-25 LAB — PROTIME-INR
INR: 0.97 (ref 0.00–1.49)
Prothrombin Time: 12.8 seconds (ref 11.6–15.2)

## 2012-12-25 LAB — HEPARIN LEVEL (UNFRACTIONATED): Heparin Unfractionated: 0.38 IU/mL (ref 0.30–0.70)

## 2012-12-25 LAB — PLATELET INHIBITION P2Y12: Platelet Function  P2Y12: 72 [PRU] — ABNORMAL LOW (ref 194–418)

## 2012-12-25 MED ORDER — SODIUM CHLORIDE 0.9 % IV SOLN: INTRAVENOUS | Status: DC

## 2012-12-25 MED ORDER — ASPIRIN 81 MG PO CHEW
324.0000 mg | CHEWABLE_TABLET | ORAL | Status: AC
Start: 2012-12-26 — End: 2012-12-26
  Administered 2012-12-26: 324 mg via ORAL
  Filled 2012-12-25: qty 4

## 2012-12-25 MED ORDER — SODIUM CHLORIDE 0.9 % IV SOLN: 250.0000 mL | INTRAVENOUS | Status: DC | PRN

## 2012-12-25 MED ORDER — SODIUM CHLORIDE 0.9 % IJ SOLN
3.0000 mL | Freq: Two times a day (BID) | INTRAMUSCULAR | Status: DC
  Administered 2012-12-25: 3 mL via INTRAVENOUS

## 2012-12-25 MED ORDER — DIAZEPAM 2 MG PO TABS
2.0000 mg | ORAL_TABLET | ORAL | Status: AC
  Administered 2012-12-26: 2 mg via ORAL
  Filled 2012-12-25: qty 1

## 2012-12-25 MED ORDER — SODIUM CHLORIDE 0.9 % IJ SOLN: 3.0000 mL | INTRAMUSCULAR | Status: DC | PRN

## 2012-12-25 NOTE — Progress Notes (Signed)
Consent obtained for cardiac cath.

## 2012-12-25 NOTE — Progress Notes (Signed)
ANTICOAGULATION CONSULT NOTE - Follow Up Consult  Pharmacy Consult for heparin Indication: NSTEMI  Labs:  Recent Labs  12/23/12 1641 12/23/12 1738 12/23/12 2144  12/24/12 0335 12/24/12 0744 12/24/12 1616 12/25/12 0530  HGB 15.5*  --   --   --  14.4  --   --  13.0  HCT 45.0  --   --   --  42.3  --   --  38.8  PLT 329  --   --   --  291  --   --  252  HEPARINUNFRC  --   --   --   < >  --  0.60 0.59 0.38  CREATININE 0.70  --   --   --   --  0.68  --   --   CKTOTAL  --  109  --   --   --   --   --   --   CKMB  --  6.4*  --   --   --   --   --   --   TROPONINI 0.90* 0.90* 0.71*  --  0.46*  --   --   --   < > = values in this interval not displayed.  Assessment: 67yo female therapeutic on heparin for NSTEMI.  Goal of Therapy:  Heparin level 0.3-0.7 units/ml   Plan:  Cont heparin gtt at 900 units/hr Daily heparin level and CBC in AM  Talbert Cage, PharmD Clinical Pharmacist Pager [phone]   12/25/2012,11:36 AM

## 2012-12-25 NOTE — Progress Notes (Signed)
Utilization Review Completed.Jamie Belger T3/16/2014  

## 2012-12-25 NOTE — Progress Notes (Addendum)
SUBJECTIVE:  No further chest pain  OBJECTIVE:   Vitals:   Filed Vitals:   12/24/12 0945 12/24/12 1354 12/24/12 2040 12/25/12 0548  BP: 84/60 104/68 89/55 90/60   Pulse:  63 63 59  Temp:  97.9 F (36.6 C) 97.9 F (36.6 C) 98 F (36.7 C)  TempSrc:  Oral Oral Oral  Resp:  18 19 20   Height:      Weight:      SpO2:  96% 95% 97%   I&O's:   Intake/Output Summary (Last 24 hours) at 12/25/12 1030 Last data filed at 12/25/12 1023  Gross per 24 hour  Intake  835.4 ml  Output   1200 ml  Net -364.6 ml   TELEMETRY: Reviewed telemetry pt in NSR:     PHYSICAL EXAM General: Well developed, well nourished, in no acute distress Head: Eyes PERRLA, No xanthomas.   Normal cephalic and atramatic  Lungs:   Clear bilaterally to auscultation and percussion. Heart:   HRRR S1 S2 Pulses are 2+ & equal. Abdomen: Bowel sounds are positive, abdomen soft and non-tender without masses  Extremities:   No clubbing, cyanosis or edema.  DP +1 Neuro: Alert and oriented X 3. Psych:  Good affect, responds appropriately   LABS: Basic Metabolic Panel:  Recent Labs  47/82/95 1641 12/24/12 0335 12/24/12 0744  NA 141  --  141  K 2.9*  --  3.5  CL 103  --  107  CO2 25  --  24  GLUCOSE 127*  --  122*  BUN 14  --  17  CREATININE 0.70  --  0.68  CALCIUM 11.3*  --  8.9  MG  --  2.0  --    Liver Function Tests: No results found for this basename: AST, ALT, ALKPHOS, BILITOT, PROT, ALBUMIN,  in the last 72 hours No results found for this basename: LIPASE, AMYLASE,  in the last 72 hours CBC:  Recent Labs  12/23/12 1641 12/24/12 0335 12/25/12 0530  WBC 11.5* 13.6* 10.5  NEUTROABS 7.6  --   --   HGB 15.5* 14.4 13.0  HCT 45.0 42.3 38.8  MCV 81.7 82.0 83.8  PLT 329 291 252   Cardiac Enzymes:  Recent Labs  12/23/12 1641 12/23/12 1738 12/23/12 2144 12/24/12 0335  CKTOTAL  --  109  --   --   CKMB  --  6.4*  --   --   TROPONINI 0.90* 0.90* 0.71* 0.46*   BNP: No components found with this  basename: POCBNP,  D-Dimer: No results found for this basename: DDIMER,  in the last 72 hours Hemoglobin A1C:  Recent Labs  12/24/12 0030  HGBA1C 5.8*   Fasting Lipid Panel:  Recent Labs  12/24/12 0335  CHOL 184  HDL 61  LDLCALC 95  TRIG 140  CHOLHDL 3.0   Thyroid Function Tests:  Recent Labs  12/24/12 0030  TSH 2.850   Anemia Panel: No results found for this basename: VITAMINB12, FOLATE, FERRITIN, TIBC, IRON, RETICCTPCT,  in the last 72 hours Coag Panel:   No results found for this basename: INR, PROTIME    RADIOLOGY: Dg Chest Port 1 View  12/23/2012  *RADIOLOGY REPORT*  Clinical Data: Chest pain.  PORTABLE CHEST - 1 VIEW  Comparison: None  Findings: The cardiomediastinal silhouette is unremarkable. The lungs are clear. There is no evidence of focal airspace disease, pulmonary edema, suspicious pulmonary nodule/mass, pleural effusion, or pneumothorax. No acute bony abnormalities are identified.  IMPRESSION: No evidence of active cardiopulmonary  disease.   Original Report Authenticated By: Harmon Pier, M.D.    ASSESSMENT:  1. NSTEMI with no further chest pain  2. HTN  3. Remote tobacco use  4. RBBB  5. Hypotension - still with borderline low BP  PLAN:  1. Continue ASA//Plavix/IV Heparin - pt refused statin 2. beta blocker stopped secondary to hypotension  3. IV fluids at 75cc/hr 4. Plan cath Monday 5.  Cardiac catheterization was discussed with the patient fully including risks on myocardial infarction, death, stroke, bleeding, arrhythmia, dye allergy, renal insufficiency or bleeding.  All patient questions and concerns were discussed and the patient understands and is willing to proceed.        Quintella Reichert, MD  12/25/2012  10:30 AM

## 2012-12-25 NOTE — Progress Notes (Signed)
Pt up ambulating in hallway; no SOB or CP; will cont. To monitor.

## 2012-12-25 NOTE — Progress Notes (Signed)
Pt watching cardiac cath video at this time; will cont. To monitor. 

## 2012-12-26 ENCOUNTER — Encounter (HOSPITAL_COMMUNITY)
Admission: EM | Disposition: A | Discharge status: Home / Self Care | Payer: Self-pay | Source: Home / Self Care | Attending: Cardiology

## 2012-12-26 DIAGNOSIS — I5181 Takotsubo syndrome: Secondary | ICD-10-CM | POA: Diagnosis not present

## 2012-12-26 DIAGNOSIS — I10 Essential (primary) hypertension: Secondary | ICD-10-CM | POA: Diagnosis not present

## 2012-12-26 HISTORY — PX: LEFT HEART CATHETERIZATION WITH CORONARY ANGIOGRAM: SHX5451

## 2012-12-26 LAB — CBC
HCT: 39.2 % (ref 36.0–46.0)
Hemoglobin: 13.3 g/dL (ref 12.0–15.0)
MCH: 27.8 pg (ref 26.0–34.0)
MCHC: 33.9 g/dL (ref 30.0–36.0)
MCV: 81.8 fL (ref 78.0–100.0)
Platelets: 264 10*3/uL (ref 150–400)
RBC: 4.79 MIL/uL (ref 3.87–5.11)
RDW: 14.3 % (ref 11.5–15.5)
WBC: 11.2 10*3/uL — ABNORMAL HIGH (ref 4.0–10.5)

## 2012-12-26 LAB — POCT ACTIVATED CLOTTING TIME: Activated Clotting Time: 160 seconds

## 2012-12-26 LAB — HEPARIN LEVEL (UNFRACTIONATED): Heparin Unfractionated: 0.39 IU/mL (ref 0.30–0.70)

## 2012-12-26 SURGERY — LEFT HEART CATHETERIZATION WITH CORONARY ANGIOGRAM
Anesthesia: Moderate Sedation | Laterality: Bilateral

## 2012-12-26 MED ORDER — FENTANYL CITRATE 0.05 MG/ML IJ SOLN
INTRAMUSCULAR | Status: AC
  Filled 2012-12-26: qty 2

## 2012-12-26 MED ORDER — LIDOCAINE HCL (PF) 1 % IJ SOLN
INTRAMUSCULAR | Status: AC
  Filled 2012-12-26: qty 30

## 2012-12-26 MED ORDER — CARVEDILOL 3.125 MG PO TABS: 3.1250 mg | ORAL_TABLET | Freq: Two times a day (BID) | ORAL | Status: DC

## 2012-12-26 MED ORDER — SIMVASTATIN 20 MG PO TABS: 20.0000 mg | ORAL_TABLET | Freq: Every evening | ORAL | Status: DC

## 2012-12-26 MED ORDER — HEPARIN (PORCINE) IN NACL 2-0.9 UNIT/ML-% IJ SOLN
INTRAMUSCULAR | Status: AC
  Filled 2012-12-26: qty 1000

## 2012-12-26 MED ORDER — ACETAMINOPHEN 325 MG PO TABS: 650.0000 mg | ORAL_TABLET | ORAL | Status: DC | PRN

## 2012-12-26 MED ORDER — MIDAZOLAM HCL 5 MG/5ML IJ SOLN
INTRAMUSCULAR | Status: AC
  Filled 2012-12-26: qty 5

## 2012-12-26 MED ORDER — CARVEDILOL 3.125 MG PO TABS
3.1250 mg | ORAL_TABLET | Freq: Two times a day (BID) | ORAL | Status: DC
  Filled 2012-12-26 (×2): qty 1

## 2012-12-26 MED ORDER — ONDANSETRON HCL 4 MG/2ML IJ SOLN: 4.0000 mg | Freq: Four times a day (QID) | INTRAMUSCULAR | Status: DC | PRN

## 2012-12-26 MED ORDER — SODIUM CHLORIDE 0.9 % IV SOLN: 1.0000 mL/kg/h | INTRAVENOUS | Status: DC

## 2012-12-26 NOTE — Progress Notes (Signed)
CARDIAC REHAB PHASE I   PRE:  Rate/Rhythm: 89 SR  BP:  Supine:   Sitting: 116/62  Standing:    SaO2: 99 RA  MODE:  Ambulation: 890 ft   POST:  Rate/Rhythm: 82 SR  BP:  Supine:   Sitting: 116/80  Standing:    SaO2: 1335-1445 Pt tolerated ambulation well without c/o of cp or SOB. VS stable. Completed MI education with pt. She voices understanding. Pt agrees to [employer]. CRP in [place], will send referral.  Melina Copa RN 12/26/2012 2:51 PM

## 2012-12-26 NOTE — Discharge Summary (Signed)
Patient ID: Valerie Nash MRN: [id number] DOB/AGE: July 11, 1946 67 y.o.  Admit date: 12/23/2012 Discharge date: 12/26/2012  Primary Discharge Diagnosis  NSTEMI stress induced with normal coronary arteries and moderate LV dysfunction c/w Takotsubo cardiomyopathy  Secondary Discharge Diagnosis  Depression  HTN  LBBB  Cancer  Orthostatic Hypotension  Significant Diagnostic Studies: angiography: PROCEDURE: Left heart catheterization with selective coronary angiography, left ventriculogram.  INDICATIONS: NSTEMI, LV dysfunction  The risks, benefits, and details of the procedure were explained to the patient. The patient verbalized understanding and wanted to proceed. Informed written consent was obtained.  PROCEDURE TECHNIQUE: After Xylocaine anesthesia a 15F sheath was placed in the right femoral artery with a single anterior needle wall stick. Left coronary angiography was done using a Judkins L4 guide catheter. Right coronary angiography was done using a Judkins R4 guide catheter. Left ventriculography was done using a pigtail catheter.  CONTRAST: Total of 60 cc.  COMPLICATIONS: None.  HEMODYNAMICS: Aortic pressure was 111/82mmHg; LV pressure was 110/72mmHg; LVEDP . There was no gradient between the left ventricle and aorta.  ANGIOGRAPHIC DATA: The left main coronary artery is widely patent. It trifurcates into a small ramus branch, a left circumflex artery and LAD.  The left anterior descending artery is widely patent in its proximal portion. It gives rise to a large first diagonal which bifurcates into 2 daughter vessels both of which are patent. In the mid LAD there is a 20-30% narrowing.  The ramus is a very small branch which is patent.  The left circumflex artery is widely patent. It gives rise to a large OM1 branch which is widely patent. The ongoing left circumflex traverses the AV groove and gives rise to a large second OM2 which is widely patent  The right coronary artery is  calcified at its ostium but widely patent throughout its course. It gives rise to 2 acute RV marginal branches and distally gives rise to a PDA and PL branches which are patent.  LEFT VENTRICULOGRAM: Left ventricular angiogram was done in the 30 RAO projection and revealed moderate LV systolic dysfunction with an estimated ejection fraction of 35%. There was apical ballooning with severe apical hypokinesis c/w Takotsubo cardiomyopathy. LVEDP was 17 mmHg.  IMPRESSIONS:  1. Normal left main coronary artery. 2. Normal left anterior descending artery branches with a 30-40% mid LAD stenosis. 3. Normal left circumflex artery and its branches. 4. Patent right coronary artery with calcium at the ostium. 5. Moderate left ventricular systolic dysfunction. LVEDP 17 mmHg. Ejection fraction 35% with apical ballooning c/w Takotsubo CM. RECOMMENDATION:  1. ASA 81mg  daily  2. No beta blocker or ACE I due to hypotension  3. Statin therapy  4. D/C home later today if BP stable  *[place]* *[facility]* [address] [place], [place] [address] [phone]  ------------------------------------------------------------ Transthoracic Echocardiography  Patient: Valerie Nash, Valerie Nash MR #: [id number] Study Date: 12/24/2012 Gender: F Age: 88 Height: 162.6cm Weight: 69.4kg BSA: 1.59m^2 Pt. Status: Room: 2021  ADMITTING Shawnna Pancake ATTENDING Taji Sather Olin Hauser, Laneka Mcgrory REFERRING Armanda Magic PERFORMING [facility], Ec SONOGRAPHER Georgian Co, RDCS, CCT cc:  ------------------------------------------------------------ LV EF: 35% - 40%  ------------------------------------------------------------ Indications: Chest pain 786.51.  ------------------------------------------------------------ History: PMH: Myocardial infarction. Risk factors: Hypertension.  ------------------------------------------------------------ Study Conclusions  - Left ventricle: The  cavity size was normal. Systolic function was moderately reduced. The estimated ejection fraction was in the range of 35% to 40%. There is severe hypokinesis of the mid-distalinferior myocardium. There is moderate hypokinesis of  the entirelateral myocardium. There was an increased relative contribution of atrial contraction to ventricular filling. Doppler parameters are consistent with abnormal left ventricular relaxation (grade 1 diastolic dysfunction). - Atrial septum: No defect or patent foramen ovale was identified.    Consults: None  Hospital Course: This is a 67yo WF with a history of HTN, BBB and depression who presented for evaluation of chest pain. She stated that she initally developed mid sternal chest discomfort on Thursday around 5pm after a water aerobics class. Pain was 2/10. Pain persisted overnight and she had difficulty sleeping. She continued to have pain on Friday, but mainly came into ED due to new headache and nausea. She denied any shortness of breath or diaphoresis. She received ASA, NTG and became chest pain free. Initial troponin was positive with RBBB on ecg. She was accepted for admission and transferred for further management.  She had no further chest pain during her hospitalization.  An echo was done which revealed moderate LV dysfunction with EF 35% and mid to distal inferior/posterior and lateral HK.  On 3/17 she underwent cardiac cath revealing nonobstructive ASCAD of the LAD and otherwise normal coronary arteries with apical ballooning on LVgram c/w Takotsubo CM.  Of note she had some problems with orthostatic hypotension during her hospitalization and BP meds were discontinued.  Due to borderline low BP a beta blocker or ACE I could not be started.  At time of discharge she was in stable condition ambulating post cath.    Discharge Exam: Blood pressure 120/70, pulse 80, temperature 98.5 F (36.9 C), temperature source Oral, resp. rate 18, height 5\' 4"  (1.626  m), weight 69.4 kg (153 lb), SpO2 96.00%.   General appearance: alert Resp: clear to auscultation bilaterally Cardio: regular rate and rhythm, S1, S2 normal, no murmur, click, rub or gallop GI: soft, non-tender; bowel sounds normal; no masses,  no organomegaly Extremities: extremities normal, atraumatic, no cyanosis or edema Labs:   Lab Results  Component Value Date   WBC 11.2* 12/26/2012   HGB 13.3 12/26/2012   HCT 39.2 12/26/2012   MCV 81.8 12/26/2012   PLT 264 12/26/2012    Recent Labs Lab 12/25/12 1103  NA 138  K 4.0  CL 108  CO2 18*  BUN 13  CREATININE 0.68  CALCIUM 9.0  GLUCOSE 87   Lab Results  Component Value Date   CKTOTAL 109 12/23/2012   CKMB 6.4* 12/23/2012   TROPONINI 0.46* 12/24/2012    Lab Results  Component Value Date   CHOL 184 12/24/2012   Lab Results  Component Value Date   HDL 61 12/24/2012   Lab Results  Component Value Date   LDLCALC 95 12/24/2012   Lab Results  Component Value Date   TRIG 140 12/24/2012   Lab Results  Component Value Date   CHOLHDL 3.0 12/24/2012   No results found for this basename: LDLDIRECT      Radiology:  *RADIOLOGY REPORT*  Clinical Data: Chest pain.  PORTABLE CHEST - 1 VIEW  Comparison: None  Findings: The cardiomediastinal silhouette is unremarkable.  The lungs are clear.  There is no evidence of focal airspace disease, pulmonary edema,  suspicious pulmonary nodule/mass, pleural effusion, or  pneumothorax.  No acute bony abnormalities are identified.  IMPRESSION:  No evidence of active cardiopulmonary disease.  Original Report Authenticated By: Harmon Pier, M.D.  EKG:  NSR, RBBB, LAFB  FOLLOW UP PLANS AND APPOINTMENTS  Future Appointments Provider Department Dept Phone   02/16/2013 11:00 AM Mick Sell  [facility] [phone]   02/16/2013 12:00 PM [facility] [phone]       Medication List    STOP taking these  medications       olmesartan-hydrochlorothiazide 20-12.5 MG per tablet  Commonly known as:  BENICAR HCT      TAKE these medications       aspirin EC 81 MG tablet  Take 81 mg by mouth at bedtime.     Biotin 5000 MCG Caps  Take 5,000 mcg by mouth daily.     CALCIUM-VITAMIN D PO  Take 1 tablet by mouth daily.     fish oil-omega-3 fatty acids 1000 MG capsule  Take 1,000 mg by mouth daily.     multivitamin with minerals Tabs  Take 1 tablet by mouth daily.     venlafaxine 75 MG tablet  Commonly known as:  EFFEXOR  Take 75 mg by mouth daily.        Simvastatin 20 MG tablet     Take 1 tablet by mouth daily.         Follow-up Information   Follow up with Quintella Reichert, MD On 01/02/2013. (at 10:45am)    Contact information:   [address] [place] [address] [phone]       BRING ALL MEDICATIONS WITH YOU TO FOLLOW UP APPOINTMENTS  Time spent with patient to include physician time:40 minutes Signed: Jahquan Klugh R 12/26/2012, 9:11 AM

## 2012-12-26 NOTE — H&P (View-Only) (Signed)
SUBJECTIVE:  No further chest pain  OBJECTIVE:   Vitals:   Filed Vitals:   12/24/12 0945 12/24/12 1354 12/24/12 2040 12/25/12 0548  BP: 84/60 104/68 89/55 90/60   Pulse:  63 63 59  Temp:  97.9 F (36.6 C) 97.9 F (36.6 C) 98 F (36.7 C)  TempSrc:  Oral Oral Oral  Resp:  18 19 20   Height:      Weight:      SpO2:  96% 95% 97%   I&O's:   Intake/Output Summary (Last 24 hours) at 12/25/12 1030 Last data filed at 12/25/12 1023  Gross per 24 hour  Intake  835.4 ml  Output   1200 ml  Net -364.6 ml   TELEMETRY: Reviewed telemetry pt in NSR:     PHYSICAL EXAM General: Well developed, well nourished, in no acute distress Head: Eyes PERRLA, No xanthomas.   Normal cephalic and atramatic  Lungs:   Clear bilaterally to auscultation and percussion. Heart:   HRRR S1 S2 Pulses are 2+ & equal. Abdomen: Bowel sounds are positive, abdomen soft and non-tender without masses  Extremities:   No clubbing, cyanosis or edema.  DP +1 Neuro: Alert and oriented X 3. Psych:  Good affect, responds appropriately   LABS: Basic Metabolic Panel:  Recent Labs  78/46/96 1641 12/24/12 0335 12/24/12 0744  NA 141  --  141  K 2.9*  --  3.5  CL 103  --  107  CO2 25  --  24  GLUCOSE 127*  --  122*  BUN 14  --  17  CREATININE 0.70  --  0.68  CALCIUM 11.3*  --  8.9  MG  --  2.0  --    Liver Function Tests: No results found for this basename: AST, ALT, ALKPHOS, BILITOT, PROT, ALBUMIN,  in the last 72 hours No results found for this basename: LIPASE, AMYLASE,  in the last 72 hours CBC:  Recent Labs  12/23/12 1641 12/24/12 0335 12/25/12 0530  WBC 11.5* 13.6* 10.5  NEUTROABS 7.6  --   --   HGB 15.5* 14.4 13.0  HCT 45.0 42.3 38.8  MCV 81.7 82.0 83.8  PLT 329 291 252   Cardiac Enzymes:  Recent Labs  12/23/12 1641 12/23/12 1738 12/23/12 2144 12/24/12 0335  CKTOTAL  --  109  --   --   CKMB  --  6.4*  --   --   TROPONINI 0.90* 0.90* 0.71* 0.46*   BNP: No components found with this  basename: POCBNP,  D-Dimer: No results found for this basename: DDIMER,  in the last 72 hours Hemoglobin A1C:  Recent Labs  12/24/12 0030  HGBA1C 5.8*   Fasting Lipid Panel:  Recent Labs  12/24/12 0335  CHOL 184  HDL 61  LDLCALC 95  TRIG 140  CHOLHDL 3.0   Thyroid Function Tests:  Recent Labs  12/24/12 0030  TSH 2.850   Anemia Panel: No results found for this basename: VITAMINB12, FOLATE, FERRITIN, TIBC, IRON, RETICCTPCT,  in the last 72 hours Coag Panel:   No results found for this basename: INR, PROTIME    RADIOLOGY: Dg Chest Port 1 View  12/23/2012  *RADIOLOGY REPORT*  Clinical Data: Chest pain.  PORTABLE CHEST - 1 VIEW  Comparison: None  Findings: The cardiomediastinal silhouette is unremarkable. The lungs are clear. There is no evidence of focal airspace disease, pulmonary edema, suspicious pulmonary nodule/mass, pleural effusion, or pneumothorax. No acute bony abnormalities are identified.  IMPRESSION: No evidence of active cardiopulmonary  disease.   Original Report Authenticated By: Harmon Pier, M.D.    ASSESSMENT:  1. NSTEMI with no further chest pain  2. HTN  3. Remote tobacco use  4. RBBB  5. Hypotension - still with borderline low BP  PLAN:  1. Continue ASA//Plavix/IV Heparin - pt refused statin 2. beta blocker stopped secondary to hypotension  3. IV fluids at 75cc/hr 4. Plan cath Monday 5.  Cardiac catheterization was discussed with the patient fully including risks on myocardial infarction, death, stroke, bleeding, arrhythmia, dye allergy, renal insufficiency or bleeding.  All patient questions and concerns were discussed and the patient understands and is willing to proceed.        Quintella Reichert, MD  12/25/2012  10:30 AM

## 2012-12-26 NOTE — Progress Notes (Signed)
Pt transferred to cath lab, VSS, on call meds given.

## 2012-12-26 NOTE — Interval H&P Note (Signed)
History and Physical Interval Note:  12/26/2012 8:13 AM  Valerie Nash  has presented today for surgery, with the diagnosis of NSTEMI  The various methods of treatment have been discussed with the patient and family. After consideration of risks, benefits and other options for treatment, the patient has consented to  Procedure(s): LEFT HEART CATHETERIZATION WITH CORONARY ANGIOGRAM (Bilateral) as a surgical intervention .  The patient's history has been reviewed, patient examined, no change in status, stable for surgery.  I have reviewed the patient's chart and labs.  Questions were answered to the patient's satisfaction.     Theola Cuellar R

## 2012-12-26 NOTE — Progress Notes (Signed)
Pt educated and informed of DC information. Verbalizes understanding of medications and f/u appts. IV removed, and bandage placed of cath site. Pt ready for DC with friend.

## 2012-12-26 NOTE — CV Procedure (Signed)
PROCEDURE:  Left heart catheterization with selective coronary angiography, left ventriculogram.  INDICATIONS:  NSTEMI, LV dysfunction  The risks, benefits, and details of the procedure were explained to the patient.  The patient verbalized understanding and wanted to proceed.  Informed written consent was obtained.  PROCEDURE TECHNIQUE:  After Xylocaine anesthesia a 39F sheath was placed in the right femoral artery with a single anterior needle wall stick.   Left coronary angiography was done using a Judkins L4 guide catheter.  Right coronary angiography was done using a Judkins R4 guide catheter.  Left ventriculography was done using a pigtail catheter.    CONTRAST:  Total of 60 cc.  COMPLICATIONS:  None.    HEMODYNAMICS:  Aortic pressure was 111/12mmHg; LV pressure was 110/46mmHg; LVEDP .  There was no gradient between the left ventricle and aorta.    ANGIOGRAPHIC DATA:   The left main coronary artery is widely patent.  It trifurcates into a small ramus branch, a left circumflex artery and LAD.  The left anterior descending artery is widely patent in its proximal portion.  It gives rise to a large first diagonal which bifurcates into 2 daughter vessels both of which are patent.  In the mid LAD there is a 20-30% narrowing.  The ramus is a very small branch which is patent.  The left circumflex artery is widely patent.  It gives rise to a large OM1 branch which is widely patent.  The ongoing left circumflex traverses the AV groove and gives rise to a large second OM2 which is widely patent  The right coronary artery is calcified at its ostium but widely patent throughout its course.  It gives rise to 2 acute RV marginal branches and distally gives rise to a PDA and PL branches which are patent.  LEFT VENTRICULOGRAM:  Left ventricular angiogram was done in the 30 RAO projection and revealed moderate LV systolic dysfunction with an estimated ejection fraction of 35%. There was apical  ballooning with severe apical hypokinesis c/w Takotsubo cardiomyopathy. LVEDP was 17 mmHg.  IMPRESSIONS:  1. Normal left main coronary artery. 2. Normal left anterior descending artery branches with a 30-40% mid LAD stenosis. 3. Normal left circumflex artery and its branches. 4. Patent right coronary artery with calcium at the ostium. 5. Moderate left ventricular systolic dysfunction.  LVEDP 17 mmHg.  Ejection fraction 35% with apical ballooning c/w Takotsubo CM.  RECOMMENDATION:   1.  ASA 81mg  daily 2.  No beta blocker or ACE I due to hypotension 3.  Statin therapy 4.  D/C home later today if BP stable

## 2013-01-02 DIAGNOSIS — I5181 Takotsubo syndrome: Secondary | ICD-10-CM | POA: Diagnosis not present

## 2013-01-02 DIAGNOSIS — I2510 Atherosclerotic heart disease of native coronary artery without angina pectoris: Secondary | ICD-10-CM | POA: Diagnosis not present

## 2013-01-02 DIAGNOSIS — E785 Hyperlipidemia, unspecified: Secondary | ICD-10-CM | POA: Diagnosis not present

## 2013-01-02 DIAGNOSIS — I10 Essential (primary) hypertension: Secondary | ICD-10-CM | POA: Diagnosis not present

## 2013-02-08 DIAGNOSIS — I10 Essential (primary) hypertension: Secondary | ICD-10-CM | POA: Diagnosis not present

## 2013-02-08 DIAGNOSIS — Z1331 Encounter for screening for depression: Secondary | ICD-10-CM | POA: Diagnosis not present

## 2013-02-08 DIAGNOSIS — G4700 Insomnia, unspecified: Secondary | ICD-10-CM | POA: Diagnosis not present

## 2013-02-08 DIAGNOSIS — Z79899 Other long term (current) drug therapy: Secondary | ICD-10-CM | POA: Diagnosis not present

## 2013-02-08 DIAGNOSIS — I2510 Atherosclerotic heart disease of native coronary artery without angina pectoris: Secondary | ICD-10-CM | POA: Diagnosis not present

## 2013-02-08 DIAGNOSIS — I5181 Takotsubo syndrome: Secondary | ICD-10-CM | POA: Diagnosis not present

## 2013-02-08 DIAGNOSIS — I951 Orthostatic hypotension: Secondary | ICD-10-CM | POA: Diagnosis not present

## 2013-02-08 DIAGNOSIS — E785 Hyperlipidemia, unspecified: Secondary | ICD-10-CM | POA: Diagnosis not present

## 2013-02-08 DIAGNOSIS — Z0000 Encounter for general adult medical examination without abnormal findings: Secondary | ICD-10-CM | POA: Diagnosis not present

## 2013-02-15 DIAGNOSIS — I5181 Takotsubo syndrome: Secondary | ICD-10-CM | POA: Diagnosis not present

## 2013-02-15 DIAGNOSIS — E785 Hyperlipidemia, unspecified: Secondary | ICD-10-CM | POA: Diagnosis not present

## 2013-02-15 DIAGNOSIS — I10 Essential (primary) hypertension: Secondary | ICD-10-CM | POA: Diagnosis not present

## 2013-02-15 DIAGNOSIS — F05 Delirium due to known physiological condition: Secondary | ICD-10-CM | POA: Diagnosis not present

## 2013-02-15 DIAGNOSIS — I452 Bifascicular block: Secondary | ICD-10-CM | POA: Diagnosis not present

## 2013-02-15 DIAGNOSIS — I2510 Atherosclerotic heart disease of native coronary artery without angina pectoris: Secondary | ICD-10-CM | POA: Diagnosis not present

## 2013-02-16 ENCOUNTER — Other Ambulatory Visit: Payer: Medicare Other | Admitting: Lab

## 2013-02-16 ENCOUNTER — Encounter: Payer: Self-pay | Admitting: Genetic Counselor

## 2013-02-16 ENCOUNTER — Ambulatory Visit (HOSPITAL_BASED_OUTPATIENT_CLINIC_OR_DEPARTMENT_OTHER): Payer: Medicare Other | Admitting: Genetic Counselor

## 2013-02-16 DIAGNOSIS — Z800 Family history of malignant neoplasm of digestive organs: Secondary | ICD-10-CM | POA: Diagnosis not present

## 2013-02-16 DIAGNOSIS — Z87898 Personal history of other specified conditions: Secondary | ICD-10-CM | POA: Diagnosis not present

## 2013-02-16 DIAGNOSIS — IMO0002 Reserved for concepts with insufficient information to code with codable children: Secondary | ICD-10-CM

## 2013-02-16 DIAGNOSIS — C50919 Malignant neoplasm of unspecified site of unspecified female breast: Secondary | ICD-10-CM

## 2013-02-16 NOTE — Progress Notes (Signed)
Dr.  Johnella Moloney requested a consultation for genetic counseling and risk assessment for Valerie Nash, a 67 y.o. female, for discussion of her personal history of breast cancer and colon polyps and family history of colon and breast cancer. She presents to clinic today to discuss the possibility of a genetic predisposition to cancer, and to further clarify her risks, as well as her family members' risks for cancer.   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 This was treated with lumpectomy and radiation. Prognostic factors were not performed at that time.  In 2004 at the age of 45, Valerie Nash was found to have 4 adenomatous colon polyps.  She was put on a 5 year colonoscopy schedule.    Past Medical History  Diagnosis Date  . Depression   . Hypertension   . BBB (bundle branch block)   . Cancer   . 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 Colon polyps 2004    4 adenomatous polyps    Past Surgical History  Procedure Laterality Date  . Shoulder surgery    . Abdominal hysterectomy    . Cesarean section    . Breast lumpectomy      History  Substance Use Topics  . Smoking status: Former Smoker -- 0.00 packs/day for 29 years    Types: Cigarettes    Quit date: 02/17/1995  . Smokeless tobacco: Not on file  . Alcohol Use: Yes     Comment: socially    REPRODUCTIVE HISTORY AND PERSONAL RISK ASSESSMENT FACTORS: Menarche was at age 3-12.   58 Uterus Intact: No Ovaries Intact: No G2P2A0 , first live birth at age 23  She has not previously undergone treatment for infertility.   OCP use for 6 years   She has used HRT in the past.    FAMILY HISTORY:  We obtained a detailed, 4-generation family history.  Significant diagnoses are listed below: Family History  Problem Relation Age of Onset  . Congestive Heart Failure Mother   . Colon cancer Father 76  . Breast cancer Sister 9  . Colon polyps Brother     2 total  . Lung  cancer Maternal Uncle     3ppd smoker  . Congestive Heart Failure Maternal Grandmother   . Lung cancer Maternal Grandfather   . Cervical cancer Daughter 12  The patient was diagnosed with breast cancer at age 39 and her sister was diagnosed at age 51.  The patients' daughter was diagnosed with cervical cancer at age 77, and her niece (brothers child) underwent BRCA testing b/c of a family history of breast cancer on her mothers side of the family.  This testing was negative.  The patients father was diagnosed with colon cancer and died at age 32.  He had twin brothers and a sister, none of whom had cancer.  His father worked in the [employer] and developed lung cancer.  The patient's mother had a brother who was a smoker who developed lung cancer, as did her father. There is no other reported cancer history.  Patient's maternal ancestors are of [place] descent, and paternal ancestors are of [place] descent. There is no reported Ashkenazi Jewish ancestry. There is no  known consanguinity.  GENETIC COUNSELING RISK ASSESSMENT, DISCUSSION, AND SUGGESTED FOLLOW UP: We reviewed the natural history and genetic etiology of sporadic, familial and hereditary cancer syndromes.  About 5-10% of breast cancer is  hereditary.  Of this, about 85% is the result of a BRCA1 or BRCA2 mutation.  We reviewed the red flags of hereditary cancer syndromes and the dominant inheritance patterns.  If the BRCA testing is negative, we discussed that we could be testing for the wrong gene.  We discussed gene panels, and that several cancer genes that are associated with different cancers can be tested at the same time.  Because of the different types of cancer that are in the patient's family, we will consider one of the panel tests if she is negative for BRCA mutations.   The patient's personal history of breast cancer and colon polyps and family history of colon cancer and polyps, and breast and cervical cancer is suggestive of the  following possible diagnosis: hereditary cancer syndrome  We discussed that identification of a hereditary cancer syndrome may help her care providers tailor the patients medical management. If a mutation indicating hereditary cancer syndrome is detected in this case, the [employer] recommendations would include increased cancer surveillance and possible prophylactic surgery. If a mutation is detected, the patient will be referred back to the referring provider and to any additional appropriate care providers to discuss the relevant options.   If a mutation is not found in the patient, this will decrease the likelihood of a hereditayr cancer syndrome as the explanation for her breast cancer or colon polyps. Cancer surveillance options would be discussed for the patient according to the appropriate standard National Comprehensive Cancer Network and American Cancer Society guidelines, with consideration of their personal and family history risk factors. In this case, the patient will be referred back to their care providers for discussions of management.   In order to estimate her chance of having a BRCA mutation, we used statistical models (Penn II) and laboratory data that take into account her personal medical history, family history and ancestry.  Because each model is different, there can be a lot of variability in the risks they give.  Therefore, these numbers must be considered a rough range and not a precise risk of having a BRCA mutation.  These models estimate that she has approximately a 10% chance of having a mutation. Based on this assessment of her family and personal history, genetic testing is recommended.  After considering the risks, benefits, and limitations, the patient provided informed consent for  the following  testing: BRCA1/2 and del/dup, reflexing to Breast/Ovarian cancer panel through [facility].   Per the patient's request, we will contact her by telephone  to discuss these results. A follow up genetic counseling visit will be scheduled if indicated.  The patient was seen for a total of 60 minutes, greater than 50% of which was spent face-to-face counseling.  This plan is being carried out per Dr. Johnella Moloney recommendations.  This note will also be sent to the referring provider via the electronic medical record. The patient will be supplied with a summary of this genetic counseling discussion as well as educational information on the discussed hereditary cancer syndromes following the conclusion of their visit.   Patient was discussed with Dr. Drue Second.  _______________________________________________________________________ For Office Staff:  Number of people involved in session: 1 Was an Intern/ student involved with case: no }

## 2013-02-17 DIAGNOSIS — C50919 Malignant neoplasm of unspecified site of unspecified female breast: Secondary | ICD-10-CM | POA: Diagnosis not present

## 2013-02-23 DIAGNOSIS — C50919 Malignant neoplasm of unspecified site of unspecified female breast: Secondary | ICD-10-CM | POA: Diagnosis not present

## 2013-03-15 DIAGNOSIS — Z1231 Encounter for screening mammogram for malignant neoplasm of breast: Secondary | ICD-10-CM | POA: Diagnosis not present

## 2013-03-23 ENCOUNTER — Telehealth: Payer: Self-pay | Admitting: Genetic Counselor

## 2013-03-23 NOTE — Telephone Encounter (Signed)
Left good news message on machine about test results.

## 2013-03-30 ENCOUNTER — Telehealth: Payer: Self-pay | Admitting: Genetic Counselor

## 2013-03-30 ENCOUNTER — Encounter: Payer: Self-pay | Admitting: Genetic Counselor

## 2013-03-30 NOTE — Telephone Encounter (Signed)
Revealed negative genetic test results but that she had an NBN VUS

## 2013-04-04 DIAGNOSIS — L821 Other seborrheic keratosis: Secondary | ICD-10-CM | POA: Diagnosis not present

## 2013-04-04 DIAGNOSIS — D239 Other benign neoplasm of skin, unspecified: Secondary | ICD-10-CM | POA: Diagnosis not present

## 2013-04-04 DIAGNOSIS — D1801 Hemangioma of skin and subcutaneous tissue: Secondary | ICD-10-CM | POA: Diagnosis not present

## 2013-04-04 DIAGNOSIS — Z85828 Personal history of other malignant neoplasm of skin: Secondary | ICD-10-CM | POA: Diagnosis not present

## 2013-04-04 DIAGNOSIS — Z808 Family history of malignant neoplasm of other organs or systems: Secondary | ICD-10-CM | POA: Diagnosis not present

## 2013-04-04 DIAGNOSIS — L819 Disorder of pigmentation, unspecified: Secondary | ICD-10-CM | POA: Diagnosis not present

## 2013-05-31 DIAGNOSIS — H18599 Other hereditary corneal dystrophies, unspecified eye: Secondary | ICD-10-CM | POA: Diagnosis not present

## 2013-05-31 DIAGNOSIS — H02839 Dermatochalasis of unspecified eye, unspecified eyelid: Secondary | ICD-10-CM | POA: Diagnosis not present

## 2013-05-31 DIAGNOSIS — H2510 Age-related nuclear cataract, unspecified eye: Secondary | ICD-10-CM | POA: Diagnosis not present

## 2013-07-10 DIAGNOSIS — J40 Bronchitis, not specified as acute or chronic: Secondary | ICD-10-CM | POA: Diagnosis not present

## 2013-07-21 DIAGNOSIS — Z23 Encounter for immunization: Secondary | ICD-10-CM | POA: Diagnosis not present

## 2013-08-15 ENCOUNTER — Encounter: Payer: Self-pay | Admitting: *Deleted

## 2013-08-15 ENCOUNTER — Encounter: Payer: Self-pay | Admitting: Cardiology

## 2013-08-15 DIAGNOSIS — F329 Major depressive disorder, single episode, unspecified: Secondary | ICD-10-CM | POA: Insufficient documentation

## 2013-08-15 DIAGNOSIS — E785 Hyperlipidemia, unspecified: Secondary | ICD-10-CM | POA: Insufficient documentation

## 2013-08-15 DIAGNOSIS — I454 Nonspecific intraventricular block: Secondary | ICD-10-CM | POA: Insufficient documentation

## 2013-08-15 DIAGNOSIS — F32A Depression, unspecified: Secondary | ICD-10-CM | POA: Insufficient documentation

## 2013-08-16 ENCOUNTER — Encounter: Payer: Self-pay | Admitting: Cardiology

## 2013-08-16 ENCOUNTER — Ambulatory Visit (INDEPENDENT_AMBULATORY_CARE_PROVIDER_SITE_OTHER): Payer: Medicare Other | Admitting: Cardiology

## 2013-08-16 VITALS — BP 160/82 | HR 65 | Ht 64.0 in | Wt 159.0 lb

## 2013-08-16 DIAGNOSIS — I5181 Takotsubo syndrome: Secondary | ICD-10-CM

## 2013-08-16 DIAGNOSIS — E785 Hyperlipidemia, unspecified: Secondary | ICD-10-CM | POA: Diagnosis not present

## 2013-08-16 DIAGNOSIS — I10 Essential (primary) hypertension: Secondary | ICD-10-CM

## 2013-08-16 DIAGNOSIS — I2510 Atherosclerotic heart disease of native coronary artery without angina pectoris: Secondary | ICD-10-CM | POA: Diagnosis not present

## 2013-08-16 NOTE — Progress Notes (Signed)
[address] [place], [place]  [address] Phone: [phone] Fax:  [phone]  Date:  08/16/2013   ID:  Valerie SNOKE, DOB [birth year], MRN [id number]  PCP:  Pearla Dubonnet, MD  Cardiologist:  Armanda Magic, MD   History of Present Illness: Valerie Nash is a 67 y.o. female with a history of NSTEMI with Takotsubo CM, nonobstructive ASCAD, HTN, dyslipidemia who is doing well.  She denies any chest pain, SOB, DOE, LE edema, dizziness or syncope.  Occasionally she will notice a skipped beat when in bed at night.   Wt Readings from Last 3 Encounters:  08/16/13 159 lb (72.122 kg)  12/23/12 153 lb (69.4 kg)  12/23/12 153 lb (69.4 kg)     Past Medical History  Diagnosis Date  . Depression   . Hypertension   . Cancer   . 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 Colon polyps 2004    4 adenomatous polyps  . Anxiety   . Sinusitis   . Cardiomyopathy   . Dyslipidemia   . Takotsubo cardiomyopathy   . Insomnia   . IBS (irritable bowel syndrome)   . Obesity (BMI 30-39.9)   . RBBB (right bundle branch block with left anterior fascicular block)   . CAD (coronary artery disease)     nonobstructive AS-30% mid LAD otherwise normal  . Takotsubo cardiomyopathy 12/2012    EF 35%    Current Outpatient Prescriptions  Medication Sig Dispense Refill  . aspirin EC 81 MG tablet Take 81 mg by mouth at bedtime.      . Biotin 5000 MCG CAPS Take 5,000 mcg by mouth daily.      Marland Kitchen CALCIUM-VITAMIN D PO Take 1 tablet by mouth daily.      . carvedilol (COREG) 3.125 MG tablet Take 1 tablet (3.125 mg total) by mouth 2 (two) times daily with a meal.  60 tablet  11  . Cyanocobalamin (VITAMIN B 12 PO) Take 1 tablet by mouth daily.      . fish oil-omega-3 fatty acids 1000 MG capsule Take 1,000 mg by mouth daily.      . Multiple Vitamin (MULTIVITAMIN WITH MINERALS) TABS Take 1 tablet by mouth daily.      Marland Kitchen venlafaxine (EFFEXOR) 75 MG tablet Take 75 mg by mouth daily.       No current  facility-administered medications for this visit.    Allergies:   No Known Allergies  Social History:  The patient  reports that she quit smoking about 18 years ago. Her smoking use included Cigarettes. She smoked 0.00 packs per day for 29 years. She does not have any smokeless tobacco history on file. She reports that she drinks alcohol. She reports that she does not use illicit drugs.   Family History:  The patient's family history includes Breast cancer (age of onset: 23) in her sister; Cervical cancer (age of onset: 22) in her daughter; Colon cancer (age of onset: 58) in her father; Colon polyps in her brother; Congestive Heart Failure in her maternal grandmother and mother; Lung cancer in her maternal grandfather and maternal uncle.   ROS:  Please see the history of present illness.      All other systems reviewed and negative.   PHYSICAL EXAM: VS:  BP 160/82  Pulse 65  Ht 5\' 4"  (1.626 m)  Wt 159 lb (72.122 kg)  BMI 27.28 kg/m2 Well nourished, well developed, in no acute distress HEENT: normal Neck: no JVD Cardiac:  normal S1, S2; RRR; no murmur Lungs:  clear to auscultation bilaterally, no wheezing, rhonchi or rales Abd: soft, nontender, no hepatomegaly Ext: no edema Skin: warm and dry Neuro:  CNs 2-12 intact, no focal abnormalities noted       ASSESSMENT AND PLAN:  1. Nonobstructive ASCAD  - continue ASA 2. Takotsubo CM - resolved now with EF 57% by echo 02/2013  - continue Carvedilol 3. HTN - it is high today but at home it is usually 130/46mmHg.  - continue carvedilol 4. Dyslipidemia - intolerant to statins due to memory issues  - recheck fasting lipids  Followup with me in 6 months  Signed, Armanda Magic, MD 08/16/2013 11:21 AM

## 2013-08-16 NOTE — Patient Instructions (Signed)
Your physician recommends that you continue on your current medications as directed. Please refer to the Current Medication list given to you today.  Your physician wants you to follow-up in: 6 months with Dr Turner You will receive a reminder letter in the mail two months in advance. If you don't receive a letter, please call our office to schedule the follow-up appointment.  

## 2013-08-21 ENCOUNTER — Telehealth: Payer: Self-pay | Admitting: General Surgery

## 2013-08-21 DIAGNOSIS — E781 Pure hyperglyceridemia: Secondary | ICD-10-CM

## 2013-08-21 NOTE — Telephone Encounter (Signed)
LVM for pt to return call to set up lipid and hepatic fasting panel for pt.

## 2013-08-21 NOTE — Telephone Encounter (Signed)
Message copied by Nita Sells on Mon Aug 21, 2013  5:21 PM ------      Message from: Armanda Magic R      Created: Thu Aug 17, 2013  2:47 PM       Please get a fasting lipid panel sincc patient is now off statins due to memory issues ------

## 2013-08-22 NOTE — Telephone Encounter (Signed)
Follow up ° ° ° ° ° °Returning Danielle's call °

## 2013-08-22 NOTE — Telephone Encounter (Signed)
Pt return a call. Dr. Mayford Knife would like to check pt's Lipid panel. Pt is not on statins. Pt is aware to come Monday 08/28/13 in AM for fasting Lipid panel.  An appointment for labs was made for pt.

## 2013-08-23 ENCOUNTER — Other Ambulatory Visit: Payer: Self-pay | Admitting: Cardiology

## 2013-08-25 ENCOUNTER — Other Ambulatory Visit: Payer: Self-pay | Admitting: Cardiology

## 2013-08-28 ENCOUNTER — Other Ambulatory Visit (INDEPENDENT_AMBULATORY_CARE_PROVIDER_SITE_OTHER): Payer: Medicare Other

## 2013-08-28 DIAGNOSIS — E781 Pure hyperglyceridemia: Secondary | ICD-10-CM | POA: Diagnosis not present

## 2013-08-28 LAB — LIPID PANEL
Cholesterol: 222 mg/dL — ABNORMAL HIGH (ref 0–200)
HDL: 62.2 mg/dL (ref >39.00)
Total CHOL/HDL Ratio: 4
Triglycerides: 95 mg/dL (ref 0.0–149.0)
VLDL: 19 mg/dL (ref 0.0–40.0)

## 2013-08-28 LAB — LDL CHOLESTEROL, DIRECT: Direct LDL: 143.3 mg/dL

## 2013-08-31 ENCOUNTER — Telehealth: Payer: Self-pay | Admitting: Cardiology

## 2013-08-31 NOTE — Telephone Encounter (Signed)
New message     Called earlier for Valerie Nash----she is going out and left her cell phone number

## 2013-08-31 NOTE — Telephone Encounter (Signed)
lmtrc

## 2013-08-31 NOTE — Telephone Encounter (Signed)
SPoke with pt regarding results.

## 2013-08-31 NOTE — Telephone Encounter (Signed)
New message ° ° ° °Returned Danielle's call °

## 2013-09-04 ENCOUNTER — Ambulatory Visit (INDEPENDENT_AMBULATORY_CARE_PROVIDER_SITE_OTHER): Payer: Medicare Other | Admitting: Pharmacist

## 2013-09-04 DIAGNOSIS — Z79899 Other long term (current) drug therapy: Secondary | ICD-10-CM | POA: Diagnosis not present

## 2013-09-04 DIAGNOSIS — E785 Hyperlipidemia, unspecified: Secondary | ICD-10-CM

## 2013-09-04 MED ORDER — PRAVASTATIN SODIUM 40 MG PO TABS: 40.0000 mg | ORAL_TABLET | Freq: Every evening | ORAL | Status: DC

## 2013-09-04 NOTE — Progress Notes (Signed)
Patient is a pleasant 67 y.o. Female with h/o NSTEMI 12/2012 who was found to have non-obstructive ASCAD on cath.  She was started on simvastatin 20 mg in 12/2012, which got LDL to 72 mg/dL, however 4 weeks after simvastatin 20 mg qd use, she developed memory issues and had to stop the medication.  Her memory impairment improved after stopping simvastatin.  She is hesitant to use statins due to this, and wanted to come in and discuss her options with me.  Risk factors:  NSTEMI (12/2012), non-obstructive CAD, HTN, age - LDL goal < 70, non-HDL goal < 100 Meds:  Only on fish oil 1 g/d.  Social history:  Quit smoking 17 years ago.  Drinks wine socially only. Family history:  Negative for CAD or elevated cholesterol per patient.  Her family history was also negative for cancer, but patient developed breast cancer as well.    Diet:  Breakfast - english muffin with fruit and coffee, or cereal with skim milk.  Lunch - erratic due to work, but typically eats a sandwich and fruit she brings from home.  Dinner is typically vegetables and only eats meat 3-4 times per week.  Her weakness is desserts, and typically eats a dessert every night - sometimes eats 2 desserts per night. Exercise:  She walks twice daily - 1 mile each time.  She walks a 15 minute mile (twice daily) almost all days of the week.  She also does yoga most days of the week.  Labs:   12/2012 (at time of NSTEMI not on therapy) - LDL 95 mg/dL, TC 213, TG 086, HDL 61 (not on lipid lowering therapy - LDL likely "falsely" low due to ACS / NSTEMI event) 02/08/13 (on statin) -  LDL 72 mg/dL, TC 578, TG 87, HDL 71 (on Simvastatin 20 mg qd - stopped soon after due to memory impairment though). 08/2013  (off statin) - LDL 143 mg/dL, TC 469, TG 95, HDL 62   Current Outpatient Prescriptions  Medication Sig Dispense Refill  . aspirin EC 81 MG tablet Take 81 mg by mouth at bedtime.      . Biotin 5000 MCG CAPS Take 5,000 mcg by mouth daily.      Marland Kitchen  CALCIUM-VITAMIN D PO Take 1 tablet by mouth daily.      . carvedilol (COREG) 3.125 MG tablet TAKE 1 TABLET TWICE A DAY WITH FOOD  180 tablet  1  . Cyanocobalamin (VITAMIN B 12 PO) Take 1 tablet by mouth daily.      . fish oil-omega-3 fatty acids 1000 MG capsule Take 1,000 mg by mouth daily.      . Multiple Vitamin (MULTIVITAMIN WITH MINERALS) TABS Take 1 tablet by mouth daily.      . pravastatin (PRAVACHOL) 40 MG tablet Take 1 tablet (40 mg total) by mouth every evening.  30 tablet  5  . venlafaxine (EFFEXOR) 75 MG tablet Take 75 mg by mouth daily.       No current facility-administered medications for this visit.   Allergies  Allergen Reactions  . Simvastatin     Memory impairment   Family History  Problem Relation Age of Onset  . Congestive Heart Failure Mother   . Colon cancer Father 30  . Breast cancer Sister 37  . Colon polyps Brother     2 total  . Lung cancer Maternal Uncle     3ppd smoker  . Congestive Heart Failure Maternal Grandmother   . Lung cancer Maternal Grandfather   .  Cervical cancer Daughter 36

## 2013-09-04 NOTE — Patient Instructions (Signed)
Plan:   1.  Start pravastatin today - take 1/2 tablet of 40 mg for first month. 2.  After 1 month, increase to pravastatin 40 mg - 1 pill daily. 3.  Reduce dessert intake. 4.  Continue daily meals and exercise. 5.  If muscle/joint aches on statin, okay to start Co-Enzyme Q 10, 200 mg daily. 6.  Check cholesterol in 3 months, and then see Riki Rusk the next day to review results.

## 2013-09-04 NOTE — Assessment & Plan Note (Addendum)
Patient and I discussed treatment options for approximately 15-20 minutes including different statins, Zetia, Welchol, Niacin, plant sterols, etc.  We discussed pros vs cons, and evidence linked to each agent.  We discussed side effects of joint aches and memory impairment which is what patient is concerned with after her previous experience and from experiences friends have had. Patient is willing to start pravastatin given lower side effect profile, but wanting to reduce future risk as much as possible with statin.  Will have patient start on lower dosage for 4 weeks, then increase dosage.  Patients meal time diet and exercise regimen is great, however her dessert intake needs to be addressed, and patient is agreeable to this.  She will also use Nikole Swartzentruber Balance / Benechol or a spread rich in plant stanols in the morning with her english muffin. We discussed her option of adding Co-Q 10 if she developed any muscle aches from pravastatin.  I told her we would likely need pravastatin 80 mg qd in the future, but would titrate slowly given her history.  She did get an LDL drop to 72 mg/dL on simvastatin 20 mg qd in the past.  Plan:   1.  Start pravastatin today - take 1/2 tablet of 40 mg for first month. 2.  After 1 month, increase to pravastatin 40 mg - 1 pill daily. 3.  Reduce dessert intake. 4.  Continue daily meals and exercise. 5.  If muscle/joint aches on statin, okay to start Co-Enzyme Q 10, 200 mg daily. 6.  Check cholesterol in 3 months (12/08/13), and then see Riki Rusk the next day to review results (12/11/13).

## 2013-11-02 ENCOUNTER — Other Ambulatory Visit: Payer: Self-pay

## 2013-11-02 MED ORDER — PRAVASTATIN SODIUM 40 MG PO TABS: 40.0000 mg | ORAL_TABLET | Freq: Every evening | ORAL | Status: DC

## 2013-12-04 DIAGNOSIS — H2510 Age-related nuclear cataract, unspecified eye: Secondary | ICD-10-CM | POA: Diagnosis not present

## 2013-12-07 ENCOUNTER — Other Ambulatory Visit: Payer: 59

## 2013-12-08 ENCOUNTER — Other Ambulatory Visit: Payer: 59

## 2013-12-11 ENCOUNTER — Ambulatory Visit: Payer: 59 | Admitting: Pharmacist

## 2013-12-12 ENCOUNTER — Ambulatory Visit: Payer: 59 | Admitting: Pharmacist

## 2013-12-14 ENCOUNTER — Other Ambulatory Visit (INDEPENDENT_AMBULATORY_CARE_PROVIDER_SITE_OTHER): Payer: Medicare Other

## 2013-12-14 DIAGNOSIS — E785 Hyperlipidemia, unspecified: Secondary | ICD-10-CM | POA: Diagnosis not present

## 2013-12-14 DIAGNOSIS — Z79899 Other long term (current) drug therapy: Secondary | ICD-10-CM

## 2013-12-14 LAB — HEPATIC FUNCTION PANEL
ALT: 17 U/L (ref 0–35)
AST: 19 U/L (ref 0–37)
Albumin: 3.3 g/dL — ABNORMAL LOW (ref 3.5–5.2)
Alkaline Phosphatase: 72 U/L (ref 39–117)
Bilirubin, Direct: 0.1 mg/dL (ref 0.0–0.3)
Total Bilirubin: 0.7 mg/dL (ref 0.3–1.2)
Total Protein: 6.2 g/dL (ref 6.0–8.3)

## 2013-12-14 LAB — LIPID PANEL
Cholesterol: 208 mg/dL — ABNORMAL HIGH (ref 0–200)
HDL: 66.7 mg/dL (ref >39.00)
LDL Cholesterol: 126 mg/dL — ABNORMAL HIGH (ref 0–99)
Total CHOL/HDL Ratio: 3
Triglycerides: 77 mg/dL (ref 0.0–149.0)
VLDL: 15.4 mg/dL (ref 0.0–40.0)

## 2013-12-15 ENCOUNTER — Ambulatory Visit (INDEPENDENT_AMBULATORY_CARE_PROVIDER_SITE_OTHER): Payer: Medicare Other | Admitting: Pharmacist

## 2013-12-15 VITALS — BP 124/78 | Wt 165.0 lb

## 2013-12-15 DIAGNOSIS — E785 Hyperlipidemia, unspecified: Secondary | ICD-10-CM

## 2013-12-15 DIAGNOSIS — Z79899 Other long term (current) drug therapy: Secondary | ICD-10-CM | POA: Diagnosis not present

## 2013-12-15 MED ORDER — ROSUVASTATIN CALCIUM 10 MG PO TABS: 10.0000 mg | ORAL_TABLET | ORAL | Status: DC

## 2013-12-15 MED ORDER — EZETIMIBE 10 MG PO TABS
10.0000 mg | ORAL_TABLET | Freq: Every day | ORAL | Status: DC
Start: 2013-12-15 — End: 2017-04-01

## 2013-12-15 NOTE — Patient Instructions (Signed)
1.  Start Crestor 10 mg once weekly today (take on Sundays) 2.  Continue Co-Enzyme Q-10 200 mg daily. 3.  If want to use fish oil, I recommend Nordic Naturals (Earthfare, Whole Foods, Vitamin Shoppe) - 1,000 mg  - 1-2 per day. 4.  In six weeks, start Zetia 10 mg once daily. [place] Diet. 6.  Continue walking twice daily. 7.  Recheck cholesterol / liver (03/12/14 -anytime as long as fasting) and see Ysidro Evert following day on 03/13/14 at 10:00 AM 8.  Call Ysidro Evert if problem with medications [phone])

## 2013-12-15 NOTE — Progress Notes (Signed)
Patient is a pleasant 68 y.o. Female with h/o NSTEMI 12/2012 who was found to have non-obstructive ASCAD on cath.  She was started on simvastatin 20 mg in 12/2012, which got LDL to 72 mg/dL, however 4 weeks after simvastatin 20 mg qd use, she developed memory issues and had to stop the medication.  Her memory impairment improved after stopping simvastatin.  She was hesitant to use statins due to this, so wanted to come in and discuss her options with me.  We therefore started her on low dose pravastatin 20 mg qd late 08/2013, but after two weeks she started developing muscle soreness in her shoulders, hips, and hands.  She tried to tough it out, but stopped pravastatin early 11/2013 and the muscle/joint pain resolved soon after.  She states she is wanting to try something different than daily statin if possible.  She has gained 6 lbs over past 3 months, which she feels may be due to recent 2 weeks of vacation.  Her LDL was 126 mg/dL 12/2013, not on therapy.  LDL was 143 mg/dL in 08/2013 when not on statin.  Patient tells me she successfully lost weight on the [place] in the past as they worked to make sure she had a certain % of protein/carbs/fat/etc and wants to try this again.  Risk factors:  NSTEMI (12/2012), non-obstructive CAD, HTN, age - LDL goal < 70, non-HDL goal < 100 Meds:  Co-Q 10 200 mg daily, fish oil 1 g/d. Intolerant:  Simvastatin (memory), pravastatin (myalgias)  Social history:  Quit smoking 17 years ago.  Drinks wine socially only. Family history:  Negative for CAD or elevated cholesterol per patient.  Her family history was also negative for cancer, but patient developed breast cancer as well.    Diet:  This has stayed the same since our last visit.  She is still eating too many desserts in the evening.  She was also on vacation for 2 weeks recently and ate horribly.  Breakfast - english muffin with fruit and coffee, or cereal with skim milk.  Lunch - erratic due to work, but  typically eats a sandwich and fruit she brings from home.  Dinner is typically vegetables and only eats meat 3-4 times per week.  Her weakness is desserts, and typically eats a dessert every night - sometimes eats 2 desserts per night. Exercise:  She walks twice daily - 1 mile each time.  She walks a 15 minute mile (twice daily) almost all days of the week.  She also does yoga most days of the week.  Labs:   12/2013:  LDL 126, TC 208, TG 77, HDL 67, LFTs normal (not on therapy - stopped pravastatin 4 weeks prior to these labs)  12/2012 (at time of NSTEMI not on therapy) - LDL 95 mg/dL, TC 184, TG 140, HDL 61 (not on lipid lowering therapy - LDL likely "falsely" low due to ACS / NSTEMI event) 02/08/13 (on statin) -  LDL 72 mg/dL, TC 168, TG 87, HDL 71 (on Simvastatin 20 mg qd - stopped soon after due to memory impairment though). 08/2013  (off statin) - LDL 143 mg/dL, TC 222, TG 95, HDL 62   Current Outpatient Prescriptions  Medication Sig Dispense Refill  . aspirin EC 81 MG tablet Take 81 mg by mouth at bedtime.      . Biotin 5000 MCG CAPS Take 5,000 mcg by mouth daily.      Marland Kitchen CALCIUM-VITAMIN D PO Take 1 tablet by  mouth daily.      . carvedilol (COREG) 3.125 MG tablet TAKE 1 TABLET TWICE A DAY WITH FOOD  180 tablet  1  . Cyanocobalamin (VITAMIN B 12 PO) Take 1 tablet by mouth daily.      . fish oil-omega-3 fatty acids 1000 MG capsule Take 1,000 mg by mouth daily.      . Multiple Vitamin (MULTIVITAMIN WITH MINERALS) TABS Take 1 tablet by mouth daily.      Marland Kitchen venlafaxine (EFFEXOR) 75 MG tablet Take 75 mg by mouth daily.       No current facility-administered medications for this visit.   Allergies  Allergen Reactions  . Pravastatin     Muscle aches   . Simvastatin     Memory impairment   Family History  Problem Relation Age of Onset  . Congestive Heart Failure Mother   . Colon cancer Father 17  . Breast cancer Sister 67  . Colon polyps Brother     2 total  . Lung cancer Maternal Uncle      3ppd smoker  . Congestive Heart Failure Maternal Grandmother   . Lung cancer Maternal Grandfather   . Cervical cancer Daughter 33

## 2013-12-15 NOTE — Assessment & Plan Note (Addendum)
Patient's LDL has improved slightly since her last panel which was done not on meds (08/2013), however still needs a 50% LDL reduction.  Since she doesn't want to try daily statin again, we discussed options including:  Zetia, Welchol, qweek Crestor, and enrolling into [employer] study.  She was agreeable to staring once weekly Crestor to hopefully get 20% reduction, and to add Zetia six weeks later to hopefully get another 20% reduction.  She is to continue Co-Enzyme Q-10 at 200 mg qd.  She will start doing the [facility] again in hopes of losing 10-15 lbs again.  Exercise will continue.  Recheck blood work in 3 months while on both Crestor 10 mg qweek and Zetia 10 mg qd, and see me the next day.  She is to call if she develops any side effects on therapy.   Plan: 1.  Start Crestor 10 mg once weekly today (take on Sundays) 2.  Continue Co-Enzyme Q-10 200 mg daily. 3.  If want to use fish oil, I recommend Nordic Naturals (Earthfare, Whole Foods, Vitamin Shoppe) - 1,000 mg  - 1-2 per day. 4.  In six weeks, start Zetia 10 mg once daily. [place] Diet. 6.  Continue walking twice daily. 7.  Recheck cholesterol / liver (03/12/14 -anytime as long as fasting) and see Ysidro Evert following day on 03/13/14 at 10:00 AM 8.  Call Ysidro Evert if problem with medications [phone])

## 2014-02-02 ENCOUNTER — Telehealth: Payer: Self-pay | Admitting: Cardiology

## 2014-02-02 NOTE — Telephone Encounter (Signed)
New message     For Valerie Nash Need a new presc for zetia--got a discount card from [facility] but did not get the presc.  Please call presc to [facility] [address].

## 2014-02-02 NOTE — Telephone Encounter (Signed)
Advised pt rx for Zetia sent to [facility] [address] at time of OV with Ysidro Evert on 12/15/13.  Pharmacy confirmed receipt of rx.  Pt aware.

## 2014-02-15 DIAGNOSIS — Z0000 Encounter for general adult medical examination without abnormal findings: Secondary | ICD-10-CM | POA: Diagnosis not present

## 2014-02-15 DIAGNOSIS — I951 Orthostatic hypotension: Secondary | ICD-10-CM | POA: Diagnosis not present

## 2014-02-15 DIAGNOSIS — I2510 Atherosclerotic heart disease of native coronary artery without angina pectoris: Secondary | ICD-10-CM | POA: Diagnosis not present

## 2014-02-15 DIAGNOSIS — Z23 Encounter for immunization: Secondary | ICD-10-CM | POA: Diagnosis not present

## 2014-02-15 DIAGNOSIS — I5181 Takotsubo syndrome: Secondary | ICD-10-CM | POA: Diagnosis not present

## 2014-02-15 DIAGNOSIS — I452 Bifascicular block: Secondary | ICD-10-CM | POA: Diagnosis not present

## 2014-02-15 DIAGNOSIS — E785 Hyperlipidemia, unspecified: Secondary | ICD-10-CM | POA: Diagnosis not present

## 2014-02-15 DIAGNOSIS — Z1331 Encounter for screening for depression: Secondary | ICD-10-CM | POA: Diagnosis not present

## 2014-02-15 DIAGNOSIS — I10 Essential (primary) hypertension: Secondary | ICD-10-CM | POA: Diagnosis not present

## 2014-02-26 ENCOUNTER — Other Ambulatory Visit: Payer: Self-pay | Admitting: *Deleted

## 2014-02-26 MED ORDER — CARVEDILOL 3.125 MG PO TABS: ORAL_TABLET | ORAL | Status: DC

## 2014-03-12 ENCOUNTER — Other Ambulatory Visit (INDEPENDENT_AMBULATORY_CARE_PROVIDER_SITE_OTHER): Payer: Medicare Other

## 2014-03-12 DIAGNOSIS — Z79899 Other long term (current) drug therapy: Secondary | ICD-10-CM

## 2014-03-12 DIAGNOSIS — E785 Hyperlipidemia, unspecified: Secondary | ICD-10-CM | POA: Diagnosis not present

## 2014-03-12 LAB — LIPID PANEL
Cholesterol: 166 mg/dL (ref 0–200)
HDL: 59.2 mg/dL (ref >39.00)
LDL Cholesterol: 90 mg/dL (ref 0–99)
Total CHOL/HDL Ratio: 3
Triglycerides: 86 mg/dL (ref 0.0–149.0)
VLDL: 17.2 mg/dL (ref 0.0–40.0)

## 2014-03-12 LAB — HEPATIC FUNCTION PANEL
ALT: 19 U/L (ref 0–35)
AST: 22 U/L (ref 0–37)
Albumin: 3.4 g/dL — ABNORMAL LOW (ref 3.5–5.2)
Alkaline Phosphatase: 66 U/L (ref 39–117)
Bilirubin, Direct: 0 mg/dL (ref 0.0–0.3)
Total Bilirubin: 0.7 mg/dL (ref 0.2–1.2)
Total Protein: 6.2 g/dL (ref 6.0–8.3)

## 2014-03-13 ENCOUNTER — Ambulatory Visit (INDEPENDENT_AMBULATORY_CARE_PROVIDER_SITE_OTHER): Payer: Medicare Other | Admitting: Pharmacist

## 2014-03-13 VITALS — Wt 162.0 lb

## 2014-03-13 DIAGNOSIS — Z79899 Other long term (current) drug therapy: Secondary | ICD-10-CM | POA: Diagnosis not present

## 2014-03-13 DIAGNOSIS — E785 Hyperlipidemia, unspecified: Secondary | ICD-10-CM | POA: Diagnosis not present

## 2014-03-13 MED ORDER — ROSUVASTATIN CALCIUM 10 MG PO TABS: ORAL_TABLET | ORAL | Status: DC

## 2014-03-13 MED ORDER — ROSUVASTATIN CALCIUM 10 MG PO TABS: 10.0000 mg | ORAL_TABLET | ORAL | Status: DC

## 2014-03-13 NOTE — Assessment & Plan Note (Addendum)
Patient and I discussed her LDL goal of < 70 mg/dL if possible.  She tells me that dietary changes didn't make a huge difference in her cholesterol 8 years ago when she lost 30 lbs, so isn't too convinced her LDL will continue to drop another 20% with diet changes only.  Patient agreeable to continue weight loss efforts, low fat diet, and will increase Crestor to twice weekly.  If she starts getting muscle aches on Crestor twice weekly, she will cut back to once weekly.  The SPIRE clinical trial is an option we discussed, and if she can't tolerate Crestor twice weekly, we could consider enrolling her into this if she is interested.  She will call if she fails Crestor biw.  Will recheck lipid / liver again in 4 months, and see me the following day. Plan: 1.  Increase Crestor to 10 mg twice weekly. 2.  If muscle aches occur, reduce this back to once weekly.  If want to enroll into study we talked about, we could do that then.  Let me know. 3.  Continue Zetia 10 mg once daily 4.  Recheck cholesterol and liver in 4 months (07/16/14 - fasting labs) and see me 07/17/14 at 10:00.

## 2014-03-13 NOTE — Patient Instructions (Signed)
1.  Increase Crestor to 10 mg twice weekly. 2.  If muscle aches occur, reduce this back to once weekly.  If want to enroll into study we talked about, we could do that then.  Let me know. 3.  Continue Zetia 10 mg once daily 4.  Recheck cholesterol and liver in 4 months (07/16/14 - fasting labs) and see me 07/17/14 at 10:00.

## 2014-03-13 NOTE — Progress Notes (Signed)
Patient is a pleasant 68 y.o. Female with h/o NSTEMI 12/2012, referred to lipid clinic by Dr. Radford Pax.  She is here for follow up today.   She is tolerating Crestor 10 mg once weekly and Zetia 10 mg qd very well.  Her TC and LDL have continued to trend down over the past 3 months.   She was started on simvastatin 20 mg in 12/2012, which got LDL to 72 mg/dL, however 4 weeks after simvastatin 20 mg qd use, she developed memory issues and had to stop the medication.  Her memory impairment improved after stopping simvastatin.   We therefore started her on low dose pravastatin 20 mg qd late 08/2013, but after two weeks she started developing muscle soreness in her shoulders, hips, and hands.  She tried to tough it out, but stopped pravastatin early 11/2013 and the muscle/joint pain resolved soon after.  She states she is wanting to try something different than daily statin if possible.  She has gained 6 lbs over past 3 months, which she feels may be due to recent 2 weeks of vacation.  Her LDL was 126 mg/dL 12/2013, not on therapy.  LDL was 143 mg/dL in 08/2013 when not on statin.  Patient tells me she successfully lost weight on the [place] in the past as they worked to make sure she had a certain % of protein/carbs/fat/etc and wants to try this again.  Due to cost, patient hasn't been able to stick to this diet, but she has been doing a low fat diet on her own, and slowly losing weight.  In 12/2013 she agreed to add Crestor 10 mg once weekly, then in late 01/2014 she added Zetia 10 mg qd to regimen.  Doing well on regimen with no complaints.    Risk factors:  NSTEMI (12/2012), non-obstructive CAD, HTN, age - LDL goal < 70, non-HDL goal < 100 Meds:  Co-Q 10 200 mg daily, fish oil 1 g/d. Intolerant:  Simvastatin (memory), pravastatin (myalgias)  Social history:  Quit smoking 17 years ago.  Drinks wine socially only. Family history:  Negative for CAD or elevated cholesterol per patient.  Her family  history was also negative for cancer, but patient developed breast cancer as well.    Diet:  She has cut way down on her desserts which was her biggest problem.  Eating low fat diet and smaller portions as well.  Her weight is down to 162 lbs (159 at home) and she has a goal weight loss of 155 lbs. Exercise:  She walks twice daily - 1 mile each time.  She walks a 15 minute mile (twice daily) almost all days of the week.  She also does yoga most days of the week.  Labs:   03/2014:  LCL 90, TC 166, TG 86, HDL 59, LFTs normal (Crestor 10 mg once weekly + Zetia 10 mg qd) 12/2013:  LDL 126, TC 208, TG 77, HDL 67, LFTs normal (not on therapy - stopped pravastatin 4 weeks prior to these labs)  12/2012 (at time of NSTEMI not on therapy) - LDL 95 mg/dL, TC 184, TG 140, HDL 61 (not on lipid lowering therapy - LDL likely "falsely" low due to ACS / NSTEMI event) 02/08/13 (on statin) -  LDL 72 mg/dL, TC 168, TG 87, HDL 71 (on Simvastatin 20 mg qd - stopped soon after due to memory impairment though). 08/2013  (off statin) - LDL 143 mg/dL, TC 222, TG 95, HDL 62  Current Outpatient Prescriptions  Medication Sig Dispense Refill  . aspirin EC 81 MG tablet Take 81 mg by mouth at bedtime.      . Biotin 5000 MCG CAPS Take 5,000 mcg by mouth daily.      Marland Kitchen CALCIUM-VITAMIN D PO Take 1 tablet by mouth daily.      . carvedilol (COREG) 3.125 MG tablet TAKE 1 TABLET TWICE A DAY WITH FOOD  180 tablet  0  . Coenzyme Q10 (CO Q10) 100 MG CAPS Take 200 mg by mouth daily.      . Cyanocobalamin (VITAMIN B 12 PO) Take 1 tablet by mouth daily.      Marland Kitchen ezetimibe (ZETIA) 10 MG tablet Take 1 tablet (10 mg total) by mouth daily.  30 tablet  11  . fish oil-omega-3 fatty acids 1000 MG capsule Take 1,000 mg by mouth daily.      . Multiple Vitamin (MULTIVITAMIN WITH MINERALS) TABS Take 1 tablet by mouth daily.      . rosuvastatin (CRESTOR) 10 MG tablet Take 1 tablet (10 mg total) by mouth once a week.  90 tablet  3  . venlafaxine  (EFFEXOR) 75 MG tablet Take 75 mg by mouth daily.       No current facility-administered medications for this visit.   Allergies  Allergen Reactions  . Pravastatin     Muscle aches   . Simvastatin     Memory impairment   Family History  Problem Relation Age of Onset  . Congestive Heart Failure Mother   . Colon cancer Father 108  . Breast cancer Sister 40  . Colon polyps Brother     2 total  . Lung cancer Maternal Uncle     3ppd smoker  . Congestive Heart Failure Maternal Grandmother   . Lung cancer Maternal Grandfather   . Cervical cancer Daughter 35

## 2014-03-19 DIAGNOSIS — Z803 Family history of malignant neoplasm of breast: Secondary | ICD-10-CM | POA: Diagnosis not present

## 2014-03-19 DIAGNOSIS — Z1231 Encounter for screening mammogram for malignant neoplasm of breast: Secondary | ICD-10-CM | POA: Diagnosis not present

## 2014-04-03 ENCOUNTER — Telehealth: Payer: Self-pay | Admitting: Pharmacist

## 2014-04-03 NOTE — Telephone Encounter (Signed)
Patient called to inform me that her insurance now has Zetia as a Tier 4 medication, and patient has to pay 38% of cost.  She has failed pravastatin and simvastatin daily due to memory issues which can occur with statins.  Was able to tolerate Crestor at just twice weekly, but given h/o MI would like to get a 50% LDL reduction which she needed combination therapy to get, as she hasn't been able to tolerate daily statin.  Given evidence of IMPROVE-IT, the addition of Zetia to statin should help further reduce CV events, and hopefully a tier exception can be obtained.  Will await this paperwork from insurance company, but in the mean time, patient given a 2 week supply of Zetia 10 mg samples.  To Maudie Mercury to be on the look out for Tier Exception for Zetia.

## 2014-04-05 NOTE — Telephone Encounter (Signed)
Excellent.  Patient was notified.

## 2014-04-05 NOTE — Telephone Encounter (Signed)
[employer] approved the tier change request for patients zetia, dates of coverage are 10/12/13--10/11/2014

## 2014-05-02 DIAGNOSIS — J387 Other diseases of larynx: Secondary | ICD-10-CM | POA: Diagnosis not present

## 2014-05-22 ENCOUNTER — Other Ambulatory Visit: Payer: Self-pay | Admitting: Cardiology

## 2014-06-07 DIAGNOSIS — H269 Unspecified cataract: Secondary | ICD-10-CM | POA: Diagnosis not present

## 2014-06-07 DIAGNOSIS — H52209 Unspecified astigmatism, unspecified eye: Secondary | ICD-10-CM | POA: Diagnosis not present

## 2014-06-07 DIAGNOSIS — H2510 Age-related nuclear cataract, unspecified eye: Secondary | ICD-10-CM | POA: Diagnosis not present

## 2014-06-21 ENCOUNTER — Ambulatory Visit (INDEPENDENT_AMBULATORY_CARE_PROVIDER_SITE_OTHER): Payer: Medicare Other | Admitting: Cardiology

## 2014-06-21 ENCOUNTER — Encounter: Payer: Self-pay | Admitting: Cardiology

## 2014-06-21 VITALS — BP 140/90 | HR 62 | Ht 63.5 in | Wt 162.2 lb

## 2014-06-21 DIAGNOSIS — E785 Hyperlipidemia, unspecified: Secondary | ICD-10-CM | POA: Diagnosis not present

## 2014-06-21 DIAGNOSIS — I10 Essential (primary) hypertension: Secondary | ICD-10-CM

## 2014-06-21 DIAGNOSIS — I5181 Takotsubo syndrome: Secondary | ICD-10-CM

## 2014-06-21 DIAGNOSIS — I454 Nonspecific intraventricular block: Secondary | ICD-10-CM | POA: Diagnosis not present

## 2014-06-21 DIAGNOSIS — I2510 Atherosclerotic heart disease of native coronary artery without angina pectoris: Secondary | ICD-10-CM | POA: Diagnosis not present

## 2014-06-21 DIAGNOSIS — I252 Old myocardial infarction: Secondary | ICD-10-CM

## 2014-06-21 MED ORDER — CARVEDILOL 3.125 MG PO TABS: 3.1250 mg | ORAL_TABLET | Freq: Two times a day (BID) | ORAL | Status: DC

## 2014-06-21 NOTE — Patient Instructions (Addendum)
Your physician recommends that you continue on your current medications as directed. Please refer to the Current Medication list given to you today.  I sent a 90 day supply of Coreg sent into your Pharmacy for you with 3 Refills.   I cancelled your lab apointment in October due to not being able to take the Crestor Twice a Week.  We rescheduled your LIPID CLINIC appointment with Valerie Nash In September Please schedule at check out desk prior to leaving today.   Your physician wants you to follow-up in: 6 months with Dr Mallie Snooks will receive a reminder letter in the mail two months in advance. If you don't receive a letter, please call our office to schedule the follow-up appointment.

## 2014-06-21 NOTE — Progress Notes (Signed)
[place], [place], [place]  [address] Phone: [phone] Fax:  [phone]  Date:  06/21/2014   ID:  Valerie Nash, DOB 1946/02/07, MRN [id number]  PCP:  Henrine Screws, MD  Cardiologist:  Fransico Him, MD     History of Present Illness: Valerie Nash is a 68 y.o. female with a history of NSTEMI with Takotsubo CM, nonobstructive ASCAD, HTN, dyslipidemia who is doing well. She denies any chest pain, SOB, DOE, LE edema, dizziness or syncope. Occasionally she will notice a skipped beat when in bed at night. She has been intolerant to higher doses of Crestor due to memory issues.    Wt Readings from Last 3 Encounters:  06/21/14 162 lb 3.2 oz (73.573 kg)  03/13/14 162 lb (73.483 kg)  12/15/13 165 lb (74.844 kg)     Past Medical History  Diagnosis Date  . Depression   . Hypertension   . Cancer   . 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 Colon polyps 2004    4 adenomatous polyps  . Anxiety   . Sinusitis   . Cardiomyopathy   . Dyslipidemia   . Takotsubo cardiomyopathy   . Insomnia   . IBS (irritable bowel syndrome)   . Obesity (BMI 30-39.9)   . RBBB (right bundle branch block with left anterior fascicular block)   . CAD (coronary artery disease)     nonobstructive AS-30% mid LAD otherwise normal  . Takotsubo cardiomyopathy 12/2012    EF 35%    Current Outpatient Prescriptions  Medication Sig Dispense Refill  . aspirin EC 81 MG tablet Take 81 mg by mouth at bedtime.      . Biotin 5000 MCG CAPS Take 5,000 mcg by mouth daily.      Marland Kitchen CALCIUM-VITAMIN D PO Take 1 tablet by mouth daily.      . carvedilol (COREG) 3.125 MG tablet TAKE 1 TABLET TWICE A DAY WITH FOOD  60 tablet  0  . Coenzyme Q10 (CO Q10) 100 MG CAPS Take 200 mg by mouth daily.      . Cyanocobalamin (VITAMIN B 12 PO) Take 1 tablet by mouth daily.      Marland Kitchen ezetimibe (ZETIA) 10 MG tablet Take 1 tablet (10 mg total) by mouth daily.  30 tablet  11  . fish oil-omega-3 fatty acids 1000 MG capsule Take 1,000 mg  by mouth daily.      Marland Kitchen ketorolac (ACULAR) 0.5 % ophthalmic solution       . Multiple Vitamin (MULTIVITAMIN WITH MINERALS) TABS Take 1 tablet by mouth daily.      . rosuvastatin (CRESTOR) 10 MG tablet Take 10 mg by mouth once a week.      . valACYclovir (VALTREX) 500 MG tablet PRN when pt sees dermatologist      . venlafaxine (EFFEXOR) 75 MG tablet Take 75 mg by mouth daily.       No current facility-administered medications for this visit.    Allergies:    Allergies  Allergen Reactions  . Pravastatin     Muscle aches   . Simvastatin     Memory impairment    Social History:  The patient  reports that she quit smoking about 19 years ago. Her smoking use included Cigarettes. She smoked 0.00 packs per day for 29 years. She does not have any smokeless tobacco history on file. She reports that she drinks alcohol. She reports that she does not use illicit drugs.   Family  History:  The patient's family history includes Breast cancer (age of onset: 41) in her sister; Cervical cancer (age of onset: 32) in her daughter; Colon cancer (age of onset: 62) in her father; Colon polyps in her brother; Congestive Heart Failure in her maternal grandmother and mother; Lung cancer in her maternal grandfather and maternal uncle.   ROS:  Please see the history of present illness.      All other systems reviewed and negative.   PHYSICAL EXAM: VS:  BP 140/90  Pulse 62  Ht 5' 3.5" (1.613 m)  Wt 162 lb 3.2 oz (73.573 kg)  BMI 28.28 kg/m2 Well nourished, well developed, in no acute distress HEENT: normal Neck: no JVD Cardiac:  normal S1, S2; RRR; no murmur Lungs:  clear to auscultation bilaterally, no wheezing, rhonchi or rales Abd: soft, nontender, no hepatomegaly Ext: no edema Skin: warm and dry Neuro:  CNs 2-12 intact, no focal abnormalities noted  EKG:  NSR at 62bpm with RBBB  ASSESSMENT AND PLAN:  1. Nonobstructive ASCAD - continue ASA  2. Takotsubo CM - resolved now with EF 57% by echo  02/2013 - continue Carvedilol  3. HTN - it is mildly elevated today but at home it is usually 124/34mmHg. - continue carvedilol        4.  Dyslipidemia - she was intolerant to higher doses of Crestor.  She tried to go up to 2 times weekly but could not tolerate due to memory problems and now is back to one tablet weekly. - continue Crestor and Zetia - she is being seen in lipid clinic and may be a candidate for the PSK9 inhibitor       5.  Chronic RBBB  Followup with me in 6 months    Signed, Fransico Him, MD 06/21/2014 9:14 AM

## 2014-07-09 ENCOUNTER — Ambulatory Visit: Payer: No Typology Code available for payment source | Admitting: Pharmacist

## 2014-07-10 ENCOUNTER — Ambulatory Visit: Payer: No Typology Code available for payment source | Admitting: Pharmacist

## 2014-07-16 ENCOUNTER — Other Ambulatory Visit: Payer: No Typology Code available for payment source

## 2014-07-17 ENCOUNTER — Ambulatory Visit: Payer: No Typology Code available for payment source | Admitting: Pharmacist

## 2014-08-13 ENCOUNTER — Telehealth: Payer: Self-pay | Admitting: Cardiology

## 2014-08-13 DIAGNOSIS — I10 Essential (primary) hypertension: Secondary | ICD-10-CM | POA: Diagnosis not present

## 2014-08-13 NOTE — Telephone Encounter (Signed)
Please have patient get an appt with her PCP today

## 2014-08-13 NOTE — Telephone Encounter (Signed)
Advised patient per Dr.Turner to see her PCP today. Patient verbalized understanding.

## 2014-08-13 NOTE — Telephone Encounter (Signed)
New Message       Pt calling stating that her BP is 159/113 and 153/110 and is concerned. Pt is feeling dizzy and getting headaches. Pt wants to know if she needs to come in to be seen by Dr. Radford Pax. Please call pt back and advise.

## 2014-08-13 NOTE — Telephone Encounter (Signed)
Patient reports feeling "strange" since this morning. Pt reports elevated BP at [facility] this morning to 159/113.  Rechecked again at her friend's -- 141/100. Rechecked again at 1245: 169/93. Pt reports "5 second headaches" which she describes as "almost headaches that go away quickly." Pt wants to know if Dr. Radford Pax thinks she needs to be seen today.  To Dr. Radford Pax for review and recommendations.

## 2014-08-27 DIAGNOSIS — J342 Deviated nasal septum: Secondary | ICD-10-CM | POA: Diagnosis not present

## 2014-08-27 DIAGNOSIS — Z23 Encounter for immunization: Secondary | ICD-10-CM | POA: Diagnosis not present

## 2014-08-27 DIAGNOSIS — I2510 Atherosclerotic heart disease of native coronary artery without angina pectoris: Secondary | ICD-10-CM | POA: Diagnosis not present

## 2014-08-27 DIAGNOSIS — E784 Other hyperlipidemia: Secondary | ICD-10-CM | POA: Diagnosis not present

## 2014-08-27 DIAGNOSIS — I10 Essential (primary) hypertension: Secondary | ICD-10-CM | POA: Diagnosis not present

## 2014-09-20 ENCOUNTER — Encounter (HOSPITAL_COMMUNITY): Payer: Self-pay | Admitting: Cardiology

## 2014-10-17 DIAGNOSIS — H02831 Dermatochalasis of right upper eyelid: Secondary | ICD-10-CM | POA: Diagnosis not present

## 2014-10-17 DIAGNOSIS — H02834 Dermatochalasis of left upper eyelid: Secondary | ICD-10-CM | POA: Diagnosis not present

## 2014-10-17 DIAGNOSIS — Z961 Presence of intraocular lens: Secondary | ICD-10-CM | POA: Diagnosis not present

## 2014-10-17 DIAGNOSIS — H524 Presbyopia: Secondary | ICD-10-CM | POA: Diagnosis not present

## 2014-10-17 DIAGNOSIS — H1852 Epithelial (juvenile) corneal dystrophy: Secondary | ICD-10-CM | POA: Diagnosis not present

## 2014-10-17 DIAGNOSIS — H2512 Age-related nuclear cataract, left eye: Secondary | ICD-10-CM | POA: Diagnosis not present

## 2014-10-24 DIAGNOSIS — J387 Other diseases of larynx: Secondary | ICD-10-CM | POA: Diagnosis not present

## 2014-10-24 DIAGNOSIS — F458 Other somatoform disorders: Secondary | ICD-10-CM | POA: Diagnosis not present

## 2014-11-01 DIAGNOSIS — H52222 Regular astigmatism, left eye: Secondary | ICD-10-CM | POA: Diagnosis not present

## 2014-11-01 DIAGNOSIS — H2512 Age-related nuclear cataract, left eye: Secondary | ICD-10-CM | POA: Diagnosis not present

## 2014-12-17 ENCOUNTER — Ambulatory Visit: Payer: No Typology Code available for payment source | Admitting: Cardiology

## 2015-01-16 DIAGNOSIS — J340 Abscess, furuncle and carbuncle of nose: Secondary | ICD-10-CM | POA: Diagnosis not present

## 2015-01-16 DIAGNOSIS — J387 Other diseases of larynx: Secondary | ICD-10-CM | POA: Diagnosis not present

## 2015-02-18 IMAGING — CR DG CHEST 1V PORT
1 series · 1 of 1 positions shown · non-contrast
Comparison: None

CLINICAL DATA: Chest pain.

PORTABLE CHEST - 1 VIEW

[view not recorded]
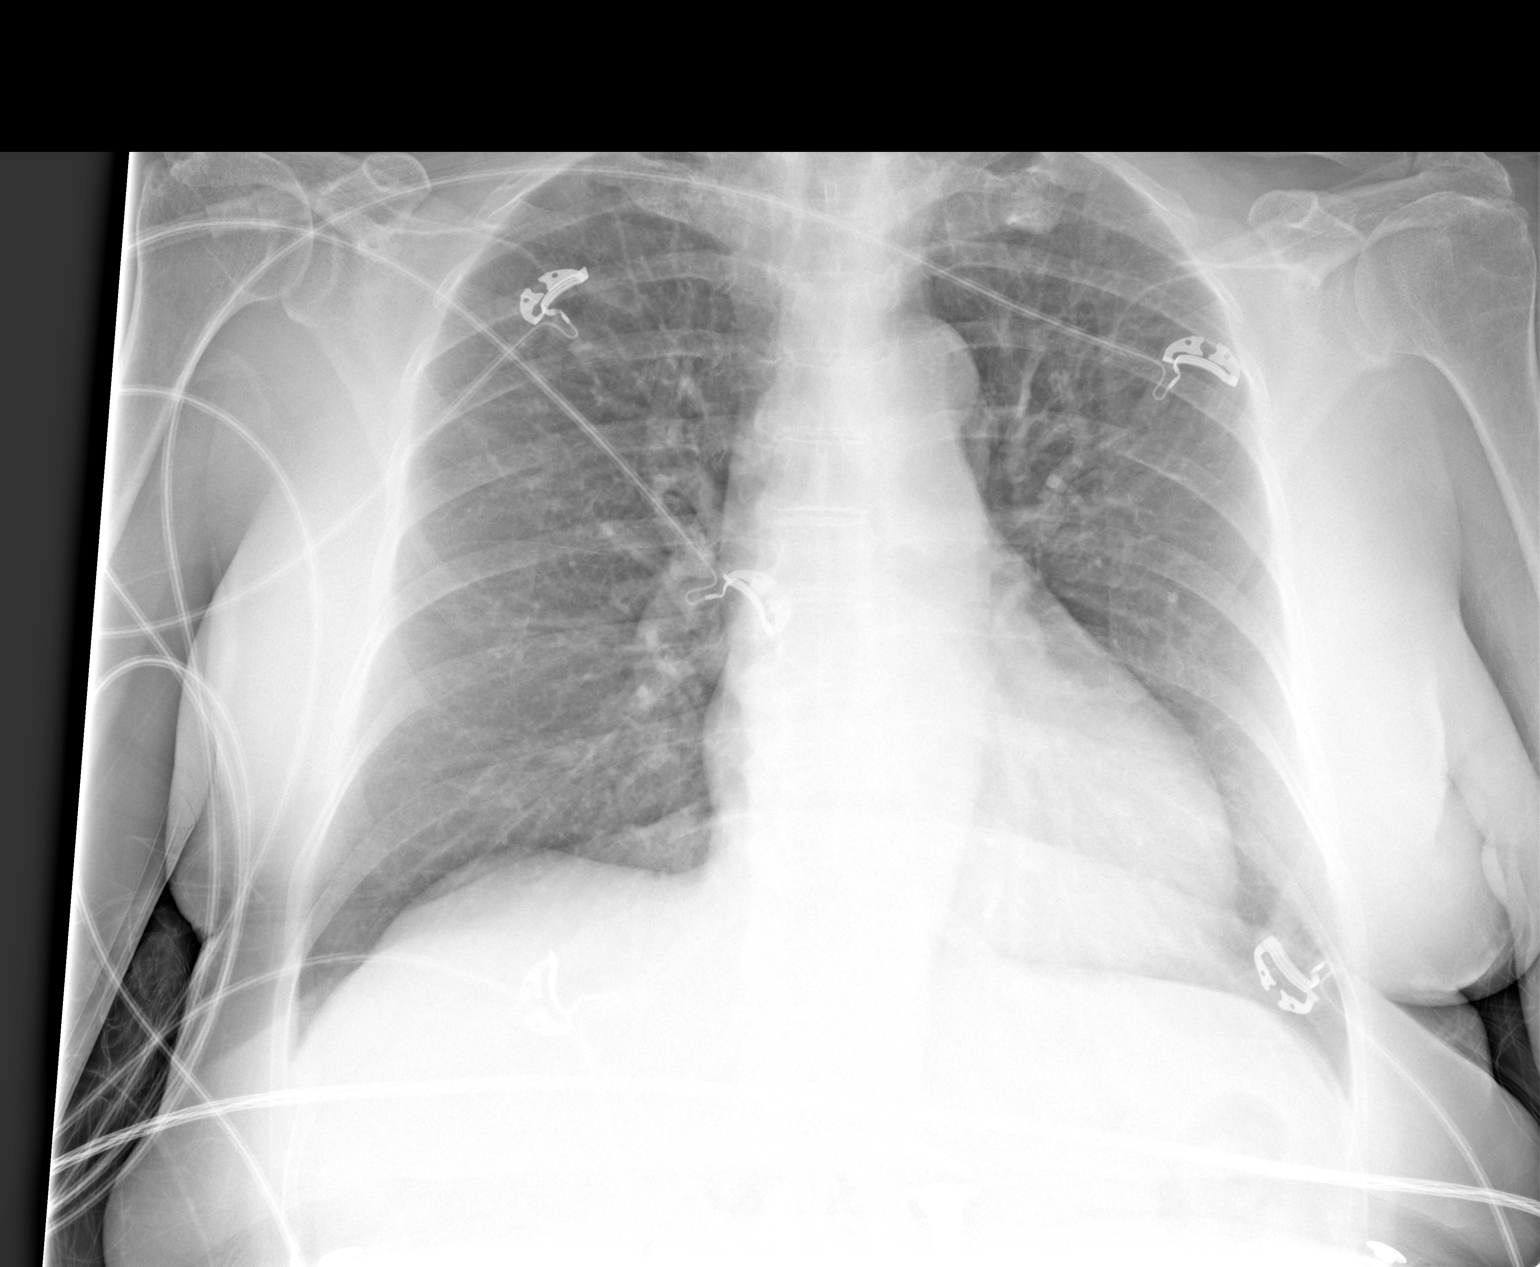

[1 of 1 positions shown; findings below may reference images not displayed]

FINDINGS: The cardiomediastinal silhouette is unremarkable.
The lungs are clear.
There is no evidence of focal airspace disease, pulmonary edema,
suspicious pulmonary nodule/mass, pleural effusion, or
pneumothorax.
No acute bony abnormalities are identified.
IMPRESSION: No evidence of active cardiopulmonary disease.

## 2015-03-25 DIAGNOSIS — J387 Other diseases of larynx: Secondary | ICD-10-CM | POA: Diagnosis not present

## 2015-03-25 DIAGNOSIS — F458 Other somatoform disorders: Secondary | ICD-10-CM | POA: Diagnosis not present

## 2015-03-25 DIAGNOSIS — J340 Abscess, furuncle and carbuncle of nose: Secondary | ICD-10-CM | POA: Diagnosis not present

## 2015-04-04 ENCOUNTER — Other Ambulatory Visit: Payer: Self-pay | Admitting: Cardiology

## 2015-04-22 DIAGNOSIS — Z853 Personal history of malignant neoplasm of breast: Secondary | ICD-10-CM | POA: Diagnosis not present

## 2015-04-22 DIAGNOSIS — Z1231 Encounter for screening mammogram for malignant neoplasm of breast: Secondary | ICD-10-CM | POA: Diagnosis not present

## 2015-05-22 ENCOUNTER — Encounter: Payer: Self-pay | Admitting: Gastroenterology

## 2015-06-19 DIAGNOSIS — Z0001 Encounter for general adult medical examination with abnormal findings: Secondary | ICD-10-CM | POA: Diagnosis not present

## 2015-06-19 DIAGNOSIS — Z1389 Encounter for screening for other disorder: Secondary | ICD-10-CM | POA: Diagnosis not present

## 2015-06-19 DIAGNOSIS — E784 Other hyperlipidemia: Secondary | ICD-10-CM | POA: Diagnosis not present

## 2015-06-19 DIAGNOSIS — I5181 Takotsubo syndrome: Secondary | ICD-10-CM | POA: Diagnosis not present

## 2015-06-19 DIAGNOSIS — I10 Essential (primary) hypertension: Secondary | ICD-10-CM | POA: Diagnosis not present

## 2015-06-19 DIAGNOSIS — R194 Change in bowel habit: Secondary | ICD-10-CM | POA: Diagnosis not present

## 2015-06-19 DIAGNOSIS — J309 Allergic rhinitis, unspecified: Secondary | ICD-10-CM | POA: Diagnosis not present

## 2015-06-19 DIAGNOSIS — Z79899 Other long term (current) drug therapy: Secondary | ICD-10-CM | POA: Diagnosis not present

## 2015-06-19 DIAGNOSIS — C50919 Malignant neoplasm of unspecified site of unspecified female breast: Secondary | ICD-10-CM | POA: Diagnosis not present

## 2015-06-19 DIAGNOSIS — Z23 Encounter for immunization: Secondary | ICD-10-CM | POA: Diagnosis not present

## 2015-06-19 DIAGNOSIS — I2510 Atherosclerotic heart disease of native coronary artery without angina pectoris: Secondary | ICD-10-CM | POA: Diagnosis not present

## 2015-06-19 DIAGNOSIS — R0982 Postnasal drip: Secondary | ICD-10-CM | POA: Diagnosis not present

## 2015-07-24 ENCOUNTER — Ambulatory Visit (INDEPENDENT_AMBULATORY_CARE_PROVIDER_SITE_OTHER): Payer: Medicare Other | Admitting: Gastroenterology

## 2015-07-24 ENCOUNTER — Encounter: Payer: Self-pay | Admitting: Gastroenterology

## 2015-07-24 VITALS — BP 122/60 | HR 62 | Ht 63.5 in | Wt 166.0 lb

## 2015-07-24 DIAGNOSIS — R1084 Generalized abdominal pain: Secondary | ICD-10-CM | POA: Diagnosis not present

## 2015-07-24 DIAGNOSIS — R221 Localized swelling, mass and lump, neck: Secondary | ICD-10-CM | POA: Diagnosis not present

## 2015-07-24 DIAGNOSIS — Z86010 Personal history of colonic polyps: Secondary | ICD-10-CM | POA: Diagnosis not present

## 2015-07-24 DIAGNOSIS — R194 Change in bowel habit: Secondary | ICD-10-CM

## 2015-07-24 DIAGNOSIS — Z800 Family history of malignant neoplasm of digestive organs: Secondary | ICD-10-CM

## 2015-07-24 MED ORDER — NA SULFATE-K SULFATE-MG SULF 17.5-3.13-1.6 GM/177ML PO SOLN: 1.0000 | Freq: Once | ORAL | Status: DC

## 2015-07-24 MED ORDER — DICYCLOMINE HCL 10 MG PO CAPS: 10.0000 mg | ORAL_CAPSULE | Freq: Three times a day (TID) | ORAL | Status: DC

## 2015-07-24 NOTE — Progress Notes (Signed)
    History of Present Illness: This is a 69 year old female here for the evaluation of a change in bowel habits, sore throat, abdominal pain, nausea. She relates problem with a sensation of a lump in her throat or something stuck in her throat that has been frequently bothersome for the past several months she also has self-described sinus pressure. She states she has been evaluated by an ENT physician and they were initially concerned her symptoms may be related to GERD. She was placed on omeprazole 20 mg twice daily for 3 months and had no change in symptoms she was placed Dexliant 60 mg daily for 1 month again with no change in symptoms. She denies heartburn and reflux regurgitation. She notes a change in bowel habits since about March this year with intermittent episodes of diarrhea and occasional episodes of constipation frequently her diarrhea is accompanied by abdominal bloating, gas and generalized crampy abdominal pain. She relates no change in diet or medications. Denies weight loss, change in stool caliber, melena, hematochezia, nausea, vomiting, dysphagia, reflux symptoms, chest pain.  Review of Systems: Pertinent positive and negative review of systems were noted in the above HPI section. All other review of systems were otherwise negative.  Current Medications, Allergies, Past Medical History, Past Surgical History, Family History and Social History were reviewed in [employer] record.  Physical Exam: General: Well developed, well nourished, no acute distress Head: Normocephalic and atraumatic Eyes:  sclerae anicteric, EOMI Ears: Normal auditory acuity Mouth: No deformity or lesions Neck: Supple, no masses or thyromegaly Lungs: Clear throughout to auscultation Heart: Regular rate and rhythm; no murmurs, rubs or bruits Abdomen: Soft, non tender and non distended. No masses, hepatosplenomegaly or hernias noted. Normal Bowel sounds Rectal: Deferred to  colonoscopy Musculoskeletal: Symmetrical with no gross deformities  Skin: No lesions on visible extremities Pulses:  Normal pulses noted Extremities: No clubbing, cyanosis, edema or deformities noted Neurological: Alert oriented x 4, grossly nonfocal Cervical Nodes:  No significant cervical adenopathy Inguinal Nodes: No significant inguinal adenopathy Psychological:  Alert and cooperative. Normal mood and affect  Assessment and Recommendations:  1. Change in bowel habits with intermittent diarrhea and constipation associated with intermittent abdominal bloating, intermittent generalized abdominal pain, intermittent gas and intermittent nausea. Suspected irritable bowel syndrome. Avoid any foods that trigger symptoms. Trial of dicyclomine 10 mg 3 times a day as needed. Gas-X 4 times a day as needed. Trial of Align once daily. Schedule colonoscopy. The risks (including bleeding, perforation, infection, missed lesions, medication reactions and possible hospitalization or surgery if complications occur), benefits, and alternatives to colonoscopy with possible biopsy and possible polypectomy were discussed with the patient and they consent to proceed.   2. Personal history of adenomatous colon polyps and family history of colon cancer. Five-year surveillance exam is almost due. Colonoscopy as above.  3. Lump in throat, sore throat. Symptoms have not responded to good therapeutic trials of treatment for acid reflux making it highly unlikely that her symptoms are acid reflux related. Will proceed with endoscopy to further evaluate for the possibility of acid reflux related symptoms. The risks (including bleeding, perforation, infection, missed lesions, medication reactions and possible hospitalization or surgery if complications occur), benefits, and alternatives to endoscopy with possible biopsy and possible dilation were discussed with the patient and they consent to proceed.

## 2015-07-24 NOTE — Patient Instructions (Signed)
We have sent the following medications to your pharmacy for you to pick up at your convenience:Bentyl.  You have been scheduled for an endoscopy and colonoscopy. Please follow the written instructions given to you at your visit today. Please pick up your prep supplies at the pharmacy within the next 1-3 days. If you use inhalers (even only as needed), please bring them with you on the day of your procedure. Your physician has requested that you go to www.startemmi.com and enter the access code given to you at your visit today. This web site gives a general overview about your procedure. However, you should still follow specific instructions given to you by our office regarding your preparation for the procedure.  Thank you for choosing me and [facility].  Pricilla Riffle. Dagoberto Ligas., MD., [facility] Regal

## 2015-08-05 ENCOUNTER — Telehealth: Payer: Self-pay | Admitting: Gastroenterology

## 2015-08-05 NOTE — Telephone Encounter (Signed)
Patient wants a breath test to rule out H pylori.  She is advised that she is scheduled for EGD and that Dr. Fuller Plan will biopsy for this is needed.  I explained that we do not perform breath tests here.  She is offered to move her procedure to 08/07/15,  She declines and will keep the appt for 09/03/15

## 2015-09-03 ENCOUNTER — Encounter: Payer: Self-pay | Admitting: Gastroenterology

## 2015-09-03 ENCOUNTER — Ambulatory Visit (AMBULATORY_SURGERY_CENTER): Payer: Medicare Other | Admitting: Gastroenterology

## 2015-09-03 VITALS — BP 138/81 | HR 81 | Temp 98.9°F | Resp 12 | Ht 63.5 in | Wt 166.0 lb

## 2015-09-03 DIAGNOSIS — R198 Other specified symptoms and signs involving the digestive system and abdomen: Secondary | ICD-10-CM

## 2015-09-03 DIAGNOSIS — I252 Old myocardial infarction: Secondary | ICD-10-CM | POA: Diagnosis not present

## 2015-09-03 DIAGNOSIS — I2510 Atherosclerotic heart disease of native coronary artery without angina pectoris: Secondary | ICD-10-CM | POA: Diagnosis not present

## 2015-09-03 DIAGNOSIS — K2960 Other gastritis without bleeding: Secondary | ICD-10-CM

## 2015-09-03 DIAGNOSIS — F458 Other somatoform disorders: Secondary | ICD-10-CM

## 2015-09-03 DIAGNOSIS — K317 Polyp of stomach and duodenum: Secondary | ICD-10-CM | POA: Diagnosis not present

## 2015-09-03 DIAGNOSIS — R221 Localized swelling, mass and lump, neck: Secondary | ICD-10-CM | POA: Diagnosis not present

## 2015-09-03 DIAGNOSIS — Z86010 Personal history of colonic polyps: Secondary | ICD-10-CM

## 2015-09-03 DIAGNOSIS — F329 Major depressive disorder, single episode, unspecified: Secondary | ICD-10-CM | POA: Diagnosis not present

## 2015-09-03 DIAGNOSIS — I429 Cardiomyopathy, unspecified: Secondary | ICD-10-CM | POA: Diagnosis not present

## 2015-09-03 DIAGNOSIS — R194 Change in bowel habit: Secondary | ICD-10-CM | POA: Diagnosis not present

## 2015-09-03 DIAGNOSIS — E669 Obesity, unspecified: Secondary | ICD-10-CM | POA: Diagnosis not present

## 2015-09-03 DIAGNOSIS — R0989 Other specified symptoms and signs involving the circulatory and respiratory systems: Secondary | ICD-10-CM

## 2015-09-03 DIAGNOSIS — K2950 Unspecified chronic gastritis without bleeding: Secondary | ICD-10-CM | POA: Diagnosis not present

## 2015-09-03 DIAGNOSIS — Z800 Family history of malignant neoplasm of digestive organs: Secondary | ICD-10-CM

## 2015-09-03 HISTORY — PX: COLONOSCOPY: SHX174

## 2015-09-03 MED ORDER — SODIUM CHLORIDE 0.9 % IV SOLN: 500.0000 mL | INTRAVENOUS | Status: DC

## 2015-09-03 NOTE — Op Note (Signed)
[place]  [address] [place], [address]   COLONOSCOPY PROCEDURE REPORT  PATIENT: Valerie Nash, Valerie Nash  MR#: [id number] BIRTHDATE: Mar 12, 1946 , 18  yrs. old GENDER: female ENDOSCOPIST: Ladene Artist, MD, [facility] REFERRED BY:  Josetta Huddle MD PROCEDURE DATE:  09/03/2015 PROCEDURE:   Colonoscopy, surveillance First Screening Colonoscopy - Avg.  risk and is 50 yrs.  old or older - No.  Prior Negative Screening - Now for repeat screening. N/A  History of Adenoma - Now for follow-up colonoscopy & has been > or = to 3 yrs.  Yes hx of adenoma.  Has been 3 or more years since last colonoscopy.  Polyps removed today? No Recommend repeat exam, <10 yrs? Yes high risk Polyps removed today? No ASA CLASS:   Class II INDICATIONS:Surveillance due to prior colonic neoplasia, Screening for colonic neoplasia, PH Colon Adenoma, and FH Colon or Rectal Adenocarcinoma. MEDICATIONS: Monitored anesthesia care and Propofol 200 mg IV DESCRIPTION OF PROCEDURE:   After the risks benefits and alternatives of the procedure were thoroughly explained, informed consent was obtained.  The digital rectal exam revealed no abnormalities of the rectum.   The LB [id number]  endoscope was introduced through the anus and advanced to the cecum, which was identified by both the appendix and ileocecal valve. No adverse events experienced.   The quality of the prep was good.  (Suprep was used)  The instrument was then slowly withdrawn as the colon was fully examined. Estimated blood loss is zero unless otherwise noted in this procedure report.    COLON FINDINGS: There was mild diverticulosis noted in the sigmoid colon.   The examination was otherwise normal.  Retroflexed views revealed no abnormalities. The time to cecum = 3.0 Withdrawal time = 11.4   The scope was withdrawn and the procedure completed. COMPLICATIONS: There were no immediate complications.  ENDOSCOPIC IMPRESSION: 1.   Mild  diverticulosis in the sigmoid colon 2.   The examination was otherwise normal  RECOMMENDATIONS: 1.  High fiber diet with liberal fluid intake. 2.  Repeat Colonoscopy in 5 years. 3.  Continue dicyclomine prn, Gas-X prn and probiotics daily  eSigned:  Ladene Artist, MD, [facility] 09/03/2015 2:49 PM

## 2015-09-03 NOTE — Progress Notes (Signed)
Called to room to assist during endoscopic procedure.  Patient ID and intended procedure confirmed with present staff. Received instructions for my participation in the procedure from the performing physician.  

## 2015-09-03 NOTE — Patient Instructions (Signed)
Discharge instructions given. Handouts on diverticulosis,gastritis and a hiatal hernia. Resume previous medications. YOU HAD AN ENDOSCOPIC PROCEDURE TODAY AT [facility]:   Refer to the procedure report that was given to you for any specific questions about what was found during the examination.  If the procedure report does not answer your questions, please call your gastroenterologist to clarify.  If you requested that your care partner not be given the details of your procedure findings, then the procedure report has been included in a sealed envelope for you to review at your convenience later.  YOU SHOULD EXPECT: Some feelings of bloating in the abdomen. Passage of more gas than usual.  Walking can help get rid of the air that was put into your GI tract during the procedure and reduce the bloating. If you had a lower endoscopy (such as a colonoscopy or flexible sigmoidoscopy) you may notice spotting of blood in your stool or on the toilet paper. If you underwent a bowel prep for your procedure, you may not have a normal bowel movement for a few days.  Please Note:  You might notice some irritation and congestion in your nose or some drainage.  This is from the oxygen used during your procedure.  There is no need for concern and it should clear up in a day or so.  SYMPTOMS TO REPORT IMMEDIATELY:   Following lower endoscopy (colonoscopy or flexible sigmoidoscopy):  Excessive amounts of blood in the stool  Significant tenderness or worsening of abdominal pains  Swelling of the abdomen that is new, acute  Fever of 100F or higher   Following upper endoscopy (EGD)  Vomiting of blood or coffee ground material  New chest pain or pain under the shoulder blades  Painful or persistently difficult swallowing  New shortness of breath  Fever of 100F or higher  Black, tarry-looking stools  For urgent or emergent issues, a gastroenterologist can be reached at any hour by calling  [phone].   DIET: Your first meal following the procedure should be a small meal and then it is ok to progress to your normal diet. Heavy or fried foods are harder to digest and may make you feel nauseous or bloated.  Likewise, meals heavy in dairy and vegetables can increase bloating.  Drink plenty of fluids but you should avoid alcoholic beverages for 24 hours.  ACTIVITY:  You should plan to take it easy for the rest of today and you should NOT DRIVE or use heavy machinery until tomorrow (because of the sedation medicines used during the test).    FOLLOW UP: Our staff will call the number listed on your records the next business day following your procedure to check on you and address any questions or concerns that you may have regarding the information given to you following your procedure. If we do not reach you, we will leave a message.  However, if you are feeling well and you are not experiencing any problems, there is no need to return our call.  We will assume that you have returned to your regular daily activities without incident.  If any biopsies were taken you will be contacted by phone or by letter within the next 1-3 weeks.  Please call us at [phone] if you have not heard about the biopsies in 3 weeks.    SIGNATURES/CONFIDENTIALITY: You and/or your care partner have signed paperwork which will be entered into your electronic medical record.  These signatures attest to the fact  that that the information above on your After Visit Summary has been reviewed and is understood.  Full responsibility of the confidentiality of this discharge information lies with you and/or your care-partner.

## 2015-09-03 NOTE — Op Note (Addendum)
[place]  [address] [place], [address]   ENDOSCOPY PROCEDURE REPORT  PATIENT: Valerie Nash, Valerie Nash  MR#: [id number] BIRTHDATE: [birth year] , 35  yrs. old GENDER: female ENDOSCOPIST: Ladene Artist, MD, [facility] REFERRED BY:  Josetta Huddle, M.D. PROCEDURE DATE:  09/03/2015 PROCEDURE:  EGD w/ biopsy ASA CLASS:     Class II INDICATIONS:  globus sensation, sore throat. MEDICATIONS: Monitored anesthesia care, Residual sedation present, and Propofol 80 mg IV TOPICAL ANESTHETIC: none DESCRIPTION OF PROCEDURE: After the risks benefits and alternatives of the procedure were thoroughly explained, informed consent was obtained.  The LB [id number] endoscope was introduced through the mouth and advanced to the second portion of the duodenum , Without limitations.  The instrument was slowly withdrawn as the mucosa was fully examined.    STOMACH: Gastritis (inflammation) was found in the gastric antrum. Multiple biopsies were performed.  A few small gastric body polyps noted. Sampling biopsies obtained. The stomach otherwise appeared normal. DUODENUM: The duodenal mucosa showed no abnormalities in the bulb and 2nd part of the duodenum. ESOPHAGUS: The mucosa of the esophagus appeared normal.  Retroflexed views revealed a small hiatal hernia.  The scope was then withdrawn from the patient and the procedure completed.  COMPLICATIONS: There were no immediate complications.  ENDOSCOPIC IMPRESSION: 1.   Gastritis in the gastric antrum; multiple biopsies performed 2.   Small hiatal hernia  RECOMMENDATIONS: 1.  No cause for sore throat, globus sensation found 2.  Await pathology results 3.  Follow up with PCP  eSigned:  Ladene Artist, MD, [facility] 09/13/2015 6:00 PM Revised: 09/13/2015 6:00 PM

## 2015-09-03 NOTE — Progress Notes (Signed)
Report to PACU, RN, vss, BBS= Clear.  

## 2015-09-04 ENCOUNTER — Telehealth: Payer: Self-pay | Admitting: *Deleted

## 2015-09-04 NOTE — Telephone Encounter (Signed)
  Follow up Call-  Call back number 09/03/2015  Post procedure Call Back phone  # [phone]  Permission to leave phone message Yes     Patient questions:  Message left to call us if necessary.

## 2015-09-13 ENCOUNTER — Encounter: Payer: Self-pay | Admitting: Gastroenterology

## 2015-12-04 DIAGNOSIS — D225 Melanocytic nevi of trunk: Secondary | ICD-10-CM | POA: Diagnosis not present

## 2015-12-04 DIAGNOSIS — Z808 Family history of malignant neoplasm of other organs or systems: Secondary | ICD-10-CM | POA: Diagnosis not present

## 2015-12-04 DIAGNOSIS — L821 Other seborrheic keratosis: Secondary | ICD-10-CM | POA: Diagnosis not present

## 2015-12-04 DIAGNOSIS — L814 Other melanin hyperpigmentation: Secondary | ICD-10-CM | POA: Diagnosis not present

## 2015-12-04 DIAGNOSIS — D1800 Hemangioma unspecified site: Secondary | ICD-10-CM | POA: Diagnosis not present

## 2015-12-04 DIAGNOSIS — Z23 Encounter for immunization: Secondary | ICD-10-CM | POA: Diagnosis not present

## 2015-12-04 DIAGNOSIS — Z85828 Personal history of other malignant neoplasm of skin: Secondary | ICD-10-CM | POA: Diagnosis not present

## 2015-12-04 DIAGNOSIS — D692 Other nonthrombocytopenic purpura: Secondary | ICD-10-CM | POA: Diagnosis not present

## 2015-12-31 DIAGNOSIS — M25561 Pain in right knee: Secondary | ICD-10-CM | POA: Diagnosis not present

## 2015-12-31 DIAGNOSIS — M25562 Pain in left knee: Secondary | ICD-10-CM | POA: Diagnosis not present

## 2016-02-25 DIAGNOSIS — R358 Other polyuria: Secondary | ICD-10-CM | POA: Diagnosis not present

## 2016-02-25 DIAGNOSIS — E784 Other hyperlipidemia: Secondary | ICD-10-CM | POA: Diagnosis not present

## 2016-02-25 DIAGNOSIS — J329 Chronic sinusitis, unspecified: Secondary | ICD-10-CM | POA: Diagnosis not present

## 2016-02-25 DIAGNOSIS — R0982 Postnasal drip: Secondary | ICD-10-CM | POA: Diagnosis not present

## 2016-03-30 DIAGNOSIS — J329 Chronic sinusitis, unspecified: Secondary | ICD-10-CM | POA: Diagnosis not present

## 2016-03-30 DIAGNOSIS — R358 Other polyuria: Secondary | ICD-10-CM | POA: Diagnosis not present

## 2016-03-30 DIAGNOSIS — I10 Essential (primary) hypertension: Secondary | ICD-10-CM | POA: Diagnosis not present

## 2016-03-30 DIAGNOSIS — R0982 Postnasal drip: Secondary | ICD-10-CM | POA: Diagnosis not present

## 2016-03-30 DIAGNOSIS — E784 Other hyperlipidemia: Secondary | ICD-10-CM | POA: Diagnosis not present

## 2016-04-09 DIAGNOSIS — I10 Essential (primary) hypertension: Secondary | ICD-10-CM | POA: Diagnosis not present

## 2016-05-01 DIAGNOSIS — I10 Essential (primary) hypertension: Secondary | ICD-10-CM | POA: Diagnosis not present

## 2016-05-28 DIAGNOSIS — J069 Acute upper respiratory infection, unspecified: Secondary | ICD-10-CM | POA: Diagnosis not present

## 2016-06-18 DIAGNOSIS — Z1231 Encounter for screening mammogram for malignant neoplasm of breast: Secondary | ICD-10-CM | POA: Diagnosis not present

## 2016-06-18 DIAGNOSIS — Z853 Personal history of malignant neoplasm of breast: Secondary | ICD-10-CM | POA: Diagnosis not present

## 2016-06-29 DIAGNOSIS — Z0001 Encounter for general adult medical examination with abnormal findings: Secondary | ICD-10-CM | POA: Diagnosis not present

## 2016-06-29 DIAGNOSIS — I10 Essential (primary) hypertension: Secondary | ICD-10-CM | POA: Diagnosis not present

## 2016-06-29 DIAGNOSIS — E784 Other hyperlipidemia: Secondary | ICD-10-CM | POA: Diagnosis not present

## 2016-06-29 DIAGNOSIS — Z1389 Encounter for screening for other disorder: Secondary | ICD-10-CM | POA: Diagnosis not present

## 2016-06-29 DIAGNOSIS — I452 Bifascicular block: Secondary | ICD-10-CM | POA: Diagnosis not present

## 2016-06-29 DIAGNOSIS — Z79899 Other long term (current) drug therapy: Secondary | ICD-10-CM | POA: Diagnosis not present

## 2016-06-29 DIAGNOSIS — Z23 Encounter for immunization: Secondary | ICD-10-CM | POA: Diagnosis not present

## 2016-06-29 DIAGNOSIS — I5181 Takotsubo syndrome: Secondary | ICD-10-CM | POA: Diagnosis not present

## 2016-06-29 DIAGNOSIS — F419 Anxiety disorder, unspecified: Secondary | ICD-10-CM | POA: Diagnosis not present

## 2016-07-06 DIAGNOSIS — D485 Neoplasm of uncertain behavior of skin: Secondary | ICD-10-CM | POA: Diagnosis not present

## 2016-07-07 DIAGNOSIS — C44311 Basal cell carcinoma of skin of nose: Secondary | ICD-10-CM | POA: Diagnosis not present

## 2016-07-27 DIAGNOSIS — C4491 Basal cell carcinoma of skin, unspecified: Secondary | ICD-10-CM | POA: Diagnosis not present

## 2016-08-11 DIAGNOSIS — R21 Rash and other nonspecific skin eruption: Secondary | ICD-10-CM | POA: Diagnosis not present

## 2016-08-18 ENCOUNTER — Other Ambulatory Visit: Payer: Self-pay | Admitting: Plastic Surgery

## 2016-08-18 DIAGNOSIS — C44319 Basal cell carcinoma of skin of other parts of face: Secondary | ICD-10-CM | POA: Diagnosis not present

## 2016-08-18 DIAGNOSIS — L089 Local infection of the skin and subcutaneous tissue, unspecified: Secondary | ICD-10-CM | POA: Diagnosis not present

## 2016-08-18 DIAGNOSIS — C44311 Basal cell carcinoma of skin of nose: Secondary | ICD-10-CM | POA: Diagnosis not present

## 2016-08-26 DIAGNOSIS — M8588 Other specified disorders of bone density and structure, other site: Secondary | ICD-10-CM | POA: Diagnosis not present

## 2016-12-01 DIAGNOSIS — L821 Other seborrheic keratosis: Secondary | ICD-10-CM | POA: Diagnosis not present

## 2016-12-01 DIAGNOSIS — Z85828 Personal history of other malignant neoplasm of skin: Secondary | ICD-10-CM | POA: Diagnosis not present

## 2016-12-01 DIAGNOSIS — L814 Other melanin hyperpigmentation: Secondary | ICD-10-CM | POA: Diagnosis not present

## 2016-12-01 DIAGNOSIS — L570 Actinic keratosis: Secondary | ICD-10-CM | POA: Diagnosis not present

## 2016-12-01 DIAGNOSIS — D1800 Hemangioma unspecified site: Secondary | ICD-10-CM | POA: Diagnosis not present

## 2016-12-01 DIAGNOSIS — D225 Melanocytic nevi of trunk: Secondary | ICD-10-CM | POA: Diagnosis not present

## 2016-12-01 DIAGNOSIS — Z23 Encounter for immunization: Secondary | ICD-10-CM | POA: Diagnosis not present

## 2016-12-01 DIAGNOSIS — D485 Neoplasm of uncertain behavior of skin: Secondary | ICD-10-CM | POA: Diagnosis not present

## 2016-12-02 DIAGNOSIS — C44719 Basal cell carcinoma of skin of left lower limb, including hip: Secondary | ICD-10-CM | POA: Diagnosis not present

## 2016-12-13 ENCOUNTER — Inpatient Hospital Stay (HOSPITAL_COMMUNITY): Payer: Medicare Other

## 2016-12-13 ENCOUNTER — Emergency Department (HOSPITAL_BASED_OUTPATIENT_CLINIC_OR_DEPARTMENT_OTHER): Payer: Medicare Other

## 2016-12-13 ENCOUNTER — Encounter (HOSPITAL_BASED_OUTPATIENT_CLINIC_OR_DEPARTMENT_OTHER): Payer: Self-pay | Admitting: Emergency Medicine

## 2016-12-13 ENCOUNTER — Inpatient Hospital Stay (HOSPITAL_BASED_OUTPATIENT_CLINIC_OR_DEPARTMENT_OTHER)
Admission: EM | Admit: 2016-12-13 | Discharge: 2016-12-16 | DRG: 286 | Disposition: A | Discharge status: Home / Self Care | Payer: Medicare Other | Attending: Cardiology | Admitting: Cardiology

## 2016-12-13 DIAGNOSIS — I420 Dilated cardiomyopathy: Secondary | ICD-10-CM | POA: Diagnosis not present

## 2016-12-13 DIAGNOSIS — Z8249 Family history of ischemic heart disease and other diseases of the circulatory system: Secondary | ICD-10-CM

## 2016-12-13 DIAGNOSIS — K589 Irritable bowel syndrome without diarrhea: Secondary | ICD-10-CM | POA: Diagnosis present

## 2016-12-13 DIAGNOSIS — R7989 Other specified abnormal findings of blood chemistry: Secondary | ICD-10-CM | POA: Diagnosis present

## 2016-12-13 DIAGNOSIS — R778 Other specified abnormalities of plasma proteins: Secondary | ICD-10-CM | POA: Diagnosis present

## 2016-12-13 DIAGNOSIS — Z86010 Personal history of colonic polyps: Secondary | ICD-10-CM

## 2016-12-13 DIAGNOSIS — E782 Mixed hyperlipidemia: Secondary | ICD-10-CM

## 2016-12-13 DIAGNOSIS — R079 Chest pain, unspecified: Secondary | ICD-10-CM

## 2016-12-13 DIAGNOSIS — I10 Essential (primary) hypertension: Secondary | ICD-10-CM

## 2016-12-13 DIAGNOSIS — R0789 Other chest pain: Secondary | ICD-10-CM | POA: Diagnosis present

## 2016-12-13 DIAGNOSIS — I371 Nonrheumatic pulmonary valve insufficiency: Secondary | ICD-10-CM | POA: Diagnosis present

## 2016-12-13 DIAGNOSIS — G4700 Insomnia, unspecified: Secondary | ICD-10-CM | POA: Diagnosis present

## 2016-12-13 DIAGNOSIS — I4510 Unspecified right bundle-branch block: Secondary | ICD-10-CM | POA: Diagnosis present

## 2016-12-13 DIAGNOSIS — R0902 Hypoxemia: Secondary | ICD-10-CM | POA: Diagnosis present

## 2016-12-13 DIAGNOSIS — I5181 Takotsubo syndrome: Principal | ICD-10-CM | POA: Diagnosis present

## 2016-12-13 DIAGNOSIS — I452 Bifascicular block: Secondary | ICD-10-CM | POA: Diagnosis present

## 2016-12-13 DIAGNOSIS — E785 Hyperlipidemia, unspecified: Secondary | ICD-10-CM | POA: Diagnosis not present

## 2016-12-13 DIAGNOSIS — I083 Combined rheumatic disorders of mitral, aortic and tricuspid valves: Secondary | ICD-10-CM | POA: Diagnosis present

## 2016-12-13 DIAGNOSIS — F419 Anxiety disorder, unspecified: Secondary | ICD-10-CM | POA: Diagnosis present

## 2016-12-13 DIAGNOSIS — I272 Pulmonary hypertension, unspecified: Secondary | ICD-10-CM | POA: Diagnosis present

## 2016-12-13 DIAGNOSIS — Z90710 Acquired absence of both cervix and uterus: Secondary | ICD-10-CM | POA: Diagnosis not present

## 2016-12-13 DIAGNOSIS — Z79899 Other long term (current) drug therapy: Secondary | ICD-10-CM

## 2016-12-13 DIAGNOSIS — I214 Non-ST elevation (NSTEMI) myocardial infarction: Secondary | ICD-10-CM

## 2016-12-13 DIAGNOSIS — I252 Old myocardial infarction: Secondary | ICD-10-CM | POA: Diagnosis not present

## 2016-12-13 DIAGNOSIS — I110 Hypertensive heart disease with heart failure: Secondary | ICD-10-CM | POA: Diagnosis present

## 2016-12-13 DIAGNOSIS — I471 Supraventricular tachycardia: Secondary | ICD-10-CM | POA: Diagnosis not present

## 2016-12-13 DIAGNOSIS — Z8371 Family history of colonic polyps: Secondary | ICD-10-CM

## 2016-12-13 DIAGNOSIS — R791 Abnormal coagulation profile: Secondary | ICD-10-CM | POA: Diagnosis present

## 2016-12-13 DIAGNOSIS — F329 Major depressive disorder, single episode, unspecified: Secondary | ICD-10-CM | POA: Diagnosis present

## 2016-12-13 DIAGNOSIS — Z853 Personal history of malignant neoplasm of breast: Secondary | ICD-10-CM

## 2016-12-13 DIAGNOSIS — I5023 Acute on chronic systolic (congestive) heart failure: Secondary | ICD-10-CM | POA: Diagnosis not present

## 2016-12-13 DIAGNOSIS — I428 Other cardiomyopathies: Secondary | ICD-10-CM | POA: Diagnosis not present

## 2016-12-13 DIAGNOSIS — I2510 Atherosclerotic heart disease of native coronary artery without angina pectoris: Secondary | ICD-10-CM | POA: Diagnosis present

## 2016-12-13 DIAGNOSIS — I2729 Other secondary pulmonary hypertension: Secondary | ICD-10-CM | POA: Diagnosis present

## 2016-12-13 DIAGNOSIS — I959 Hypotension, unspecified: Secondary | ICD-10-CM | POA: Diagnosis not present

## 2016-12-13 DIAGNOSIS — Z87891 Personal history of nicotine dependence: Secondary | ICD-10-CM

## 2016-12-13 DIAGNOSIS — R002 Palpitations: Secondary | ICD-10-CM | POA: Diagnosis present

## 2016-12-13 DIAGNOSIS — R748 Abnormal levels of other serum enzymes: Secondary | ICD-10-CM | POA: Diagnosis not present

## 2016-12-13 DIAGNOSIS — Z888 Allergy status to other drugs, medicaments and biological substances status: Secondary | ICD-10-CM

## 2016-12-13 DIAGNOSIS — Z7982 Long term (current) use of aspirin: Secondary | ICD-10-CM

## 2016-12-13 HISTORY — DX: Non-ST elevation (NSTEMI) myocardial infarction: I21.4

## 2016-12-13 LAB — ECHOCARDIOGRAM COMPLETE
FS: 19 % — AB (ref 28–44)
Height: 63 in
IVS/LV PW RATIO, ED: 1.09
LA ID, A-P, ES: 33 mm
LA diam end sys: 33 mm
LA diam index: 1.79 cm/m2
LA vol A4C: 43.9 ml
LA vol index: 25.2 mL/m2
LA vol: 46.5 mL
LV PW d: 10.9 mm — AB (ref 0.6–1.1)
LV dias vol index: 40 mL/m2
LV dias vol: 74 mL (ref 46–106)
LV e' LATERAL: 4.99 cm/s
LV sys vol index: 32 mL/m2
LV sys vol: 58 mL — AB (ref 14–42)
LVOT SV: 31 mL
LVOT VTI: 12.2 cm
LVOT area: 2.54 cm2
LVOT diameter: 18 mm
LVOT peak vel: 83.5 cm/s
Lateral S' vel: 11 cm/s
RV sys press: 52 mmHg
Reg peak vel: 349 cm/s
Simpson's disk: 21
Stroke v: 15 ml
TAPSE: 12.6 mm
TDI e' lateral: 4.99
TR max vel: 349 cm/s
Weight: 2638.47 oz

## 2016-12-13 LAB — TROPONIN I
Troponin I: 0.91 ng/mL (ref <0.03)
Troponin I: 0.86 ng/mL (ref <0.03)
Troponin I: 0.8 ng/mL (ref <0.03)
Troponin I: 0.69 ng/mL (ref <0.03)

## 2016-12-13 LAB — URINALYSIS, MICROSCOPIC (REFLEX): Squamous Epithelial / LPF: NONE SEEN

## 2016-12-13 LAB — CBC
HCT: 43.2 % (ref 36.0–46.0)
Hemoglobin: 14.4 g/dL (ref 12.0–15.0)
MCH: 28 pg (ref 26.0–34.0)
MCHC: 33.3 g/dL (ref 30.0–36.0)
MCV: 84 fL (ref 78.0–100.0)
Platelets: 303 10*3/uL (ref 150–400)
RBC: 5.14 MIL/uL — ABNORMAL HIGH (ref 3.87–5.11)
RDW: 13.8 % (ref 11.5–15.5)
WBC: 16.3 10*3/uL — ABNORMAL HIGH (ref 4.0–10.5)

## 2016-12-13 LAB — URINALYSIS, ROUTINE W REFLEX MICROSCOPIC
Bilirubin Urine: NEGATIVE
Glucose, UA: NEGATIVE mg/dL
Ketones, ur: NEGATIVE mg/dL
Leukocytes, UA: NEGATIVE
Nitrite: NEGATIVE
Protein, ur: NEGATIVE mg/dL
Specific Gravity, Urine: 1.013 (ref 1.005–1.030)
pH: 7 (ref 5.0–8.0)

## 2016-12-13 LAB — BASIC METABOLIC PANEL
Anion gap: 9 (ref 5–15)
BUN: 15 mg/dL (ref 6–20)
CO2: 22 mmol/L (ref 22–32)
Calcium: 9 mg/dL (ref 8.9–10.3)
Chloride: 110 mmol/L (ref 101–111)
Creatinine, Ser: 0.78 mg/dL (ref 0.44–1.00)
GFR calc Af Amer: >60 mL/min (ref >60)
GFR calc non Af Amer: >60 mL/min (ref >60)
Glucose, Bld: 153 mg/dL — ABNORMAL HIGH (ref 65–99)
Potassium: 3.6 mmol/L (ref 3.5–5.1)
Sodium: 141 mmol/L (ref 135–145)

## 2016-12-13 LAB — MAGNESIUM: Magnesium: 2.1 mg/dL (ref 1.7–2.4)

## 2016-12-13 LAB — PROTIME-INR
INR: 0.97
Prothrombin Time: 12.9 seconds (ref 11.4–15.2)

## 2016-12-13 LAB — MRSA PCR SCREENING: MRSA by PCR: NEGATIVE

## 2016-12-13 LAB — D-DIMER, QUANTITATIVE: D-Dimer, Quant: 0.65 ug/mL-FEU — ABNORMAL HIGH (ref 0.00–0.50)

## 2016-12-13 LAB — BRAIN NATRIURETIC PEPTIDE: B Natriuretic Peptide: 461.5 pg/mL — ABNORMAL HIGH (ref 0.0–100.0)

## 2016-12-13 LAB — HEPARIN LEVEL (UNFRACTIONATED): Heparin Unfractionated: 0.42 IU/mL (ref 0.30–0.70)

## 2016-12-13 MED ORDER — ROSUVASTATIN CALCIUM 10 MG PO TABS
10.0000 mg | ORAL_TABLET | ORAL | Status: DC
  Filled 2016-12-13: qty 1

## 2016-12-13 MED ORDER — ACETAMINOPHEN 325 MG PO TABS
650.0000 mg | ORAL_TABLET | ORAL | Status: DC | PRN
  Administered 2016-12-13: 650 mg via ORAL
  Filled 2016-12-13: qty 2

## 2016-12-13 MED ORDER — SODIUM CHLORIDE 0.9% FLUSH: 3.0000 mL | INTRAVENOUS | Status: DC | PRN

## 2016-12-13 MED ORDER — ASPIRIN EC 81 MG PO TBEC: 81.0000 mg | DELAYED_RELEASE_TABLET | Freq: Every day | ORAL | Status: DC

## 2016-12-13 MED ORDER — HEPARIN BOLUS VIA INFUSION
4000.0000 [IU] | Freq: Once | INTRAVENOUS | Status: AC
  Administered 2016-12-13: 4000 [IU] via INTRAVENOUS

## 2016-12-13 MED ORDER — VENLAFAXINE HCL ER 75 MG PO CP24
75.0000 mg | ORAL_CAPSULE | Freq: Every day | ORAL | Status: DC
  Administered 2016-12-13 – 2016-12-15 (×2): 75 mg via ORAL
  Filled 2016-12-13 (×3): qty 1

## 2016-12-13 MED ORDER — SODIUM CHLORIDE 0.9 % IV SOLN: 250.0000 mL | INTRAVENOUS | Status: DC | PRN

## 2016-12-13 MED ORDER — ONDANSETRON HCL 4 MG/2ML IJ SOLN: 4.0000 mg | Freq: Four times a day (QID) | INTRAMUSCULAR | Status: DC | PRN

## 2016-12-13 MED ORDER — ASPIRIN 300 MG RE SUPP: 300.0000 mg | RECTAL | Status: AC

## 2016-12-13 MED ORDER — VENLAFAXINE HCL 75 MG PO TABS
75.0000 mg | ORAL_TABLET | Freq: Every day | ORAL | Status: DC
  Filled 2016-12-13: qty 1

## 2016-12-13 MED ORDER — ASPIRIN 81 MG PO CHEW: 324.0000 mg | CHEWABLE_TABLET | ORAL | Status: AC

## 2016-12-13 MED ORDER — DICYCLOMINE HCL 10 MG PO CAPS
10.0000 mg | ORAL_CAPSULE | Freq: Three times a day (TID) | ORAL | Status: DC
  Filled 2016-12-13 (×4): qty 1

## 2016-12-13 MED ORDER — ASPIRIN 81 MG PO CHEW
324.0000 mg | CHEWABLE_TABLET | Freq: Once | ORAL | Status: AC
  Administered 2016-12-13: 324 mg via ORAL
  Filled 2016-12-13: qty 4

## 2016-12-13 MED ORDER — CARVEDILOL 3.125 MG PO TABS
3.1250 mg | ORAL_TABLET | Freq: Two times a day (BID) | ORAL | Status: DC
  Administered 2016-12-13 – 2016-12-16 (×5): 3.125 mg via ORAL
  Filled 2016-12-13 (×6): qty 1

## 2016-12-13 MED ORDER — OMEGA-3-ACID ETHYL ESTERS 1 G PO CAPS
1000.0000 mg | ORAL_CAPSULE | Freq: Every day | ORAL | Status: DC
  Administered 2016-12-13 – 2016-12-16 (×4): 1000 mg via ORAL
  Filled 2016-12-13 (×4): qty 1

## 2016-12-13 MED ORDER — SODIUM CHLORIDE 0.9 % IV BOLUS (SEPSIS)
500.0000 mL | Freq: Once | INTRAVENOUS | Status: AC
  Administered 2016-12-13: 500 mL via INTRAVENOUS

## 2016-12-13 MED ORDER — ASPIRIN EC 81 MG PO TBEC
81.0000 mg | DELAYED_RELEASE_TABLET | Freq: Every day | ORAL | Status: DC
  Administered 2016-12-14: 81 mg via ORAL
  Filled 2016-12-13: qty 1

## 2016-12-13 MED ORDER — SODIUM CHLORIDE 0.9% FLUSH
3.0000 mL | Freq: Two times a day (BID) | INTRAVENOUS | Status: DC
  Administered 2016-12-13 – 2016-12-15 (×5): 3 mL via INTRAVENOUS

## 2016-12-13 MED ORDER — IOPAMIDOL (ISOVUE-370) INJECTION 76%
100.0000 mL | Freq: Once | INTRAVENOUS | Status: AC | PRN
  Administered 2016-12-13: 100 mL via INTRAVENOUS

## 2016-12-13 MED ORDER — NITROGLYCERIN 0.4 MG SL SUBL: 0.4000 mg | SUBLINGUAL_TABLET | SUBLINGUAL | Status: DC | PRN

## 2016-12-13 MED ORDER — HEPARIN (PORCINE) IN NACL 100-0.45 UNIT/ML-% IJ SOLN
850.0000 [IU]/h | INTRAMUSCULAR | Status: DC
  Administered 2016-12-13 – 2016-12-14 (×2): 850 [IU]/h via INTRAVENOUS
  Filled 2016-12-13 (×2): qty 250

## 2016-12-13 NOTE — ED Notes (Addendum)
Oxygen saturation 89% on RA. Pt placed on 2L oxygen via [place]. Oxygen saturation improved to 97%.

## 2016-12-13 NOTE — ED Triage Notes (Signed)
Pt states around 2000 "didn't feel right on the inside". Pt reports pain to L chest under breast, headache, and dizziness. Denies recent illness.

## 2016-12-13 NOTE — H&P (Addendum)
Patient ID: AZELLE Nash MRN: [id number], DOB/AGE: [birth year]   Admit date: 12/13/2016  Primary Physician: Henrine Screws, MD Primary Cardiologist: Dr. Radford Pax Reason for admission: chest pain   HPI:  Valerie Nash is a 71 y.o. female with a history of NSTEMI with Takotsubo CM (2014), nonobstructive ASCAD, HTN, dyslipidemia, and RBBB who was transferred from [facility] to [facility] today for evaluation of chest pain with elevated cardiac enzymes.   She had NSTEMI with Takotsubo CM in 12/2012 with EF 35-40% with severe hypokinesis. EF had completely normalized by echo in 02/2016.   She was last seen by Dr. Radford Pax in 06/2014 but has been lost to follow up since that time. She was in her usual state of health until last night around 8pm when she started to have left sided chest discomfort. Patient reports she felt "off" the whole evening and was unable to sleep. She decided to come here this morning. Upon walking patient became increasingly short of breath and the chest pain got worse. She says this feels exactly like when she had  Takotsubo CM back in 12/2012.  On arrival the patient was hypoxic and tachycardic after walking from her car to the triage area. D dimer was positive but CT angio was negative for PE. Her troponin noted to be positive and she was started on IV heparin and transferred to [facility] for further work up and treatment.     Problem List  Past Medical History:  Diagnosis Date  . Anxiety   . 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 CAD (coronary artery disease)    nonobstructive AS-30% mid LAD otherwise normal  . Cancer ([place])   . Cardiomyopathy ([place])   . Colon polyps 2004   4 adenomatous polyps  . Depression   . Dyslipidemia   . Hypertension   . IBS (irritable bowel syndrome)   . Insomnia   . NSTEMI (non-ST elevated myocardial infarction) ([place])   . Obesity (BMI 30-39.9)   . RBBB (right bundle branch block with left anterior fascicular block)   . Sinusitis   . Takotsubo  cardiomyopathy   . 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 ABDOMINAL HYSTERECTOMY    . BREAST LUMPECTOMY    . CESAREAN SECTION    . LEFT HEART CATHETERIZATION WITH CORONARY ANGIOGRAM Bilateral 12/26/2012   Procedure: LEFT HEART CATHETERIZATION WITH CORONARY ANGIOGRAM;  Surgeon: Sueanne Margarita, MD;  Location: [facility];  Service: Cardiovascular;  Laterality: Bilateral;  . SHOULDER SURGERY       Allergies  Allergies  Allergen Reactions  . Ambien [Zolpidem Tartrate]     Nighttime overeating  . Pravastatin     Muscle aches   . Simvastatin     Memory impairment  . Topamax [Topiramate]     Tongue swelling     Home Medications  Prior to Admission medications   Medication Sig Start Date End Date Taking? Authorizing Provider  aspirin EC 81 MG tablet Take 81 mg by mouth at bedtime.    Historical Provider, MD  Biotin 5000 MCG CAPS Take 5,000 mcg by mouth daily.    Historical Provider, MD  CALCIUM-VITAMIN D PO Take 1 tablet by mouth daily.    Historical Provider, MD  carvedilol (COREG) 3.125 MG tablet TAKE 1 TABLET BY MOUTH TWICE DAILY WITH MEALS 04/04/15   Sueanne Margarita, MD  Coenzyme Q10 (CO Q10) 100 MG CAPS Take  200 mg by mouth daily.    Historical Provider, MD  Cyanocobalamin (VITAMIN B 12 PO) Take 1 tablet by mouth daily.    Historical Provider, MD  dicyclomine (BENTYL) 10 MG capsule Take 1 capsule (10 mg total) by mouth 3 (three) times daily before meals. 07/24/15   Ladene Artist, MD  ezetimibe (ZETIA) 10 MG tablet Take 1 tablet (10 mg total) by mouth daily. 12/15/13   Sueanne Margarita, MD  fish oil-omega-3 fatty acids 1000 MG capsule Take 1,000 mg by mouth daily.    Historical Provider, MD  Multiple Vitamin (MULTIVITAMIN WITH MINERALS) TABS Take 1 tablet by mouth daily.    Historical Provider, MD  rosuvastatin (CRESTOR) 10 MG tablet Take 10 mg by mouth once a week. 03/13/14   Sueanne Margarita, MD  venlafaxine (EFFEXOR) 75 MG  tablet Take 75 mg by mouth daily.    Historical Provider, MD    Family History  Family History  Problem Relation Age of Onset  . Congestive Heart Failure Mother   . Colon cancer Father 29  . Breast cancer Sister 10  . Colon polyps Brother     2 total  . Lung cancer Maternal Uncle     3ppd smoker  . Congestive Heart Failure Maternal Grandmother   . Lung cancer Maternal Grandfather   . Cervical cancer Daughter 17   Family Status  Relation Status  . Mother Deceased at age 62  . Father Deceased at age 71  . Sister Alive  . Brother Alive  . Maternal Aunt Alive  . Maternal Uncle Deceased at age 84  . Paternal Aunt Deceased  . Paternal Uncle Deceased  . Maternal Grandmother Deceased at age 82  . Maternal Grandfather Deceased  . Paternal Uncle Deceased at age 2   kidney infection  . Daughter Alive     Social History  Social History   Social History  . Marital status: Widowed    Spouse name: N/A  . Number of children: 2  . 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 Smoking status: Former Smoker    Packs/day: 0.00    Years: 29.00    Types: Cigarettes    Quit date: 02/17/1995  . Smokeless tobacco: Never Used  . Alcohol use Yes     Comment: socially  . Drug use: No  . Sexual activity: Not on file   Other Topics Concern  . Not on file   Social History Narrative  . No narrative on file     Review of Systems General:  No chills, fever, night sweats or weight changes.  Cardiovascular:  ++chest pain, ++ dyspnea on exertion, No edema, orthopnea, palpitations, paroxysmal nocturnal dyspnea. Dermatological: No rash, lesions/masses Respiratory: No cough, dyspnea Urologic: No hematuria, dysuria Abdominal:   No nausea, vomiting, diarrhea, bright red blood per rectum, melena, or hematemesis Neurologic:  No visual changes, wkns, changes in mental status. All other systems reviewed and are otherwise  negative except as noted above.  Physical Exam  Blood pressure 108/80, pulse 92, temperature 98.2 F (36.8 C), temperature source Oral, resp. rate 20, height 5\' 3"  (1.6 m), weight 164 lb 14.5 oz (74.8 kg), SpO2 94 %.  General: Pleasant, NAD Psych: Normal affect. Neuro: Alert and oriented X 3. Moves all extremities spontaneously. HEENT: Normal  Neck: Supple without bruits or JVD. Lungs:  Resp regular and unlabored, CTA.  Heart: RRR no s3, s4, mild systolic murmur Abdomen: Soft, non-tender, non-distended, BS + x 4.  Extremities: No clubbing, cyanosis or edema. DP/PT/Radials 2+ and equal bilaterally.  Labs   Recent Labs  12/13/16 0652 12/13/16 1308  TROPONINI 0.91* 0.86*   Lab Results  Component Value Date   WBC 16.3 (H) 12/13/2016   HGB 14.4 12/13/2016   HCT 43.2 12/13/2016   MCV 84.0 12/13/2016   PLT 303 12/13/2016    Recent Labs Lab 12/13/16 0652  NA 141  K 3.6  CL 110  CO2 22  BUN 15  CREATININE 0.78  CALCIUM 9.0  GLUCOSE 153*   Lab Results  Component Value Date   CHOL 166 03/12/2014   HDL 59.20 03/12/2014   LDLCALC 90 03/12/2014   TRIG 86.0 03/12/2014   Lab Results  Component Value Date   DDIMER 0.65 (H) 12/13/2016     Radiology/Studies  Dg Chest 2 View  Result Date: 12/13/2016 CLINICAL DATA:  Labs/EKG will try again shortly Patient is a 71 year old female with history of CAD, Takotsubo cardiomyopathy, and STEMI, remote hx breast cancer, hypertension hyperlipidemia presenting today with chest discomfort. EXAM: CHEST  2 VIEW COMPARISON:  12/23/2012 FINDINGS: Heart size is within normal limits. The lungs are free of focal consolidations and pleural effusions. No pulmonary edema. Mid thoracic spondylosis. IMPRESSION: No evidence for acute cardiopulmonary abnormality. Electronically Signed   By: Nolon Nations M.D.   On: 12/13/2016 07:37   Ct Angio Chest Pe W And/or Wo Contrast  Result Date: 12/13/2016 CLINICAL DATA:  Chest pain. EXAM: CT ANGIOGRAPHY  CHEST WITH CONTRAST TECHNIQUE: Multidetector CT imaging of the chest was performed using the standard protocol during bolus administration of intravenous contrast. Multiplanar CT image reconstructions and MIPs were obtained to evaluate the vascular anatomy. CONTRAST:  100 mL of Isovue 370 intravenously. COMPARISON:  Radiographs of same day. FINDINGS: Cardiovascular: Satisfactory opacification of the pulmonary arteries to the segmental level. No evidence of pulmonary embolism. Normal heart size. No pericardial effusion. Mediastinum/Nodes: No enlarged mediastinal, hilar, or axillary lymph nodes. Thyroid gland, trachea, and esophagus demonstrate no significant findings. Lungs/Pleura: Lungs are clear. No pleural effusion or pneumothorax. Upper Abdomen: No acute abnormality. Musculoskeletal: No chest wall abnormality. No acute or significant osseous findings. Review of the MIP images confirms the above findings. IMPRESSION: No definite evidence of pulmonary embolus. No acute abnormality seen in the chest. Electronically Signed   By: Marijo Conception, M.D.   On: 12/13/2016 08:41    CV Procedure Date of Service: 12/26/2012 8:13 AM Sueanne Margarita, MD  Cardiology     PROCEDURE:  Left heart catheterization with selective coronary angiography, left ventriculogram. IMPRESSIONS:  1. Normal left main coronary artery. 2. Normal left anterior descending artery branches with a 30-40% mid LAD stenosis. 3. Normal left circumflex artery and its branches. 4. Patent right coronary artery with calcium at the ostium. 5. Moderate left ventricular systolic dysfunction.  LVEDP 17 mmHg.  Ejection fraction 35% with apical ballooning c/w Takotsubo CM.  RECOMMENDATION:   1.  ASA 81mg  daily 2.  No beta blocker or ACE I due to hypotension 3.  Statin therapy 4.  D/C home later today if BP stable      ECG  Sinus tachycardia HR 113  RBBB and LAFB  ASSESSMENT AND PLAN Valerie Nash is a 71 y.o. female with a history of  NSTEMI with Takotsubo CM (2014), nonobstructive ASCAD, HTN, dyslipidemia, and RBBB who was transferred from [facility] to [facility] today  for evaluation of chest pain with elevated cardiac enzymes.   NSTEMI: 0.90--> 0.71--> 0.46--> 0.91--> 0.86. Will get 2D ECHO to evaluate her LV function. Per patient, her symptoms are exactly like when she had stress induced CM back in 12/2012. Keep NPO after midnight in case that she needs a heart cath. Continue IV heparin for now.  Non obst CAD: continue ASA, statin and BB.   HTN: BP well controlled currently.   HLD: continue statin   RBBB: chronic   Abnormal D-dimer: CTA negative for PE.   SignedAngelena Form, PA-C 12/13/2016, 4:08 PM  Pager [phone]  The patient was seen, examined and discussed with Nell Range, PA-C and I agree with the above.   71 year old female with h/o Tako-tsuboCMP in 2014 with cardiac cath showing 30-40% LAD lesion and LVEF 35-40% with improvement to 55% on follow up echo. The patient followed with Dr Radford Pax in 2015 when she was doing well. No follow up since then.She presented to [facility] this am with retrosternal pressure that is very similar to her presentation in 2014. ECG shows sinus tachycardia with RBBB, LAFB and negative T waves in the anterolateral leads. She had inferolateral negative T waves with her Tako-tsubo presentation but hjose resolved on ECG in 2015. Troponin is elevated 0.90--> 0.71--> 0.46--> 0.91--> 0.86, she was started on iv Heparin and transferred to [facility]. She still has ongoing retrosternal tightness that is mild and non-radiating. She feels weak, no SOB.  We will repeat echocardiogram today to evaluate for potential recurrent Tako-tsubo vs NSTEMI, based on that we will decide about cath tomorrow.  Meanwhile we will treat with carvedilol 6.25 mg po BID. She states that her BP was difficult to control lately. Controlled today. Her LDL was 143 in 2014, she was started on crestor that she is not taking, currently on  fish oil and zetia, we will repeat lipids.   Ena Dawley, MD 12/13/2016

## 2016-12-13 NOTE — Progress Notes (Signed)
ANTICOAGULATION CONSULT NOTE - Initial Consult  Pharmacy Consult for Heparin Indication: chest pain/ACS  Allergies  Allergen Reactions  . Ambien [Zolpidem Tartrate]     Nighttime overeating  . Pravastatin     Muscle aches   . Simvastatin     Memory impairment  . Topamax [Topiramate]     Tongue swelling    Patient Measurements: Height: 5' 3.5" (161.3 cm) Weight: 166 lb (75.3 kg) IBW/kg (Calculated) : 53.55 Heparin Dosing Weight: 70 kg  Vital Signs: Temp: 98.4 F (36.9 C) (03/04 0625) Temp Source: Oral (03/04 0625) BP: 111/83 (03/04 0848) Pulse Rate: 101 (03/04 0848)  Assessment: 71 yo F presents on 3/4 with chest pain. PMH includes CAD, NSTEMI. Pharmacy consulted to start heparin. No anticoag PTA. CBC stable, no s/s of bleed.  Goal of Therapy:  Heparin level 0.3-0.7 units/ml Monitor platelets by anticoagulation protocol: Yes   Plan:  Give heparin 4,000 unit heparin bolus Start heparin gtt at 850 units/hr Check 8 hr heparin level Monitor daily heparin level, CBC, s/s of bleed  Elenor Quinones, PharmD, BCPS Clinical Pharmacist Pager [phone] 12/13/2016 9:05 AM

## 2016-12-13 NOTE — Progress Notes (Signed)
  Echocardiogram 2D Echocardiogram has been performed.  Valerie Nash 12/13/2016, 5:45 PM

## 2016-12-13 NOTE — ED Notes (Signed)
Patient transported to CT 

## 2016-12-13 NOTE — Progress Notes (Signed)
ANTICOAGULATION CONSULT NOTE - Follow Up Consult  Pharmacy Consult for Heparin Indication: chest pain/ACS  Allergies  Allergen Reactions  . Ambien [Zolpidem Tartrate]     Nighttime overeating  . Pravastatin     Muscle aches   . Simvastatin     Memory impairment  . Topamax [Topiramate]     Tongue swelling    Patient Measurements: Height: 5\' 3"  (160 cm) Weight: 164 lb 14.5 oz (74.8 kg) IBW/kg (Calculated) : 52.4 Heparin Dosing Weight:  68.3 kg  Vital Signs: Temp: 98.2 F (36.8 C) (03/04 1530) Temp Source: Oral (03/04 1530) BP: 108/80 (03/04 1530) Pulse Rate: 92 (03/04 1530)  Labs:  Recent Labs  12/13/16 0652 12/13/16 1308 12/13/16 1643 12/13/16 1839  HGB 14.4  --   --   --   HCT 43.2  --   --   --   PLT 303  --   --   --   LABPROT  --   --  12.9  --   INR  --   --  0.97  --   HEPARINUNFRC  --   --   --  0.42  CREATININE 0.78  --   --   --   TROPONINI 0.91* 0.86* 0.80*  --     Estimated Creatinine Clearance: 63.4 mL/min (by C-G formula based on SCr of 0.78 mg/dL).   Assessment: CC/HPI: 71 yo F presents on 3/4 with chest pain. PMH includes CAD, NSTEMI. Pharmacy consulted to start heparin. No anticoag PTA. CBC stable, no s/s of bleed.  PMH: NSTEMI with Takotsubo CM (2014), nonobstructive ASCAD, HTN, dyslipidemia, and RBBB   Anticoag: CP. +enzymes. HL 0.42. D dimer was positive but CT angio was negative for PE  Goal of Therapy:  Heparin level 0.3-0.7 units/ml Monitor platelets by anticoagulation protocol: Yes   Plan:  Continue heparin gtt at 850 units/hr Monitor daily heparin level, CBC, s/s of bleed   Bryker Fletchall S. Alford Highland, PharmD, BCPS Clinical Staff Pharmacist Pager [phone]  Eilene Ghazi Stillinger 12/13/2016,7:15 PM

## 2016-12-13 NOTE — ED Provider Notes (Signed)
[facility] Provider Note   CSN: [id number] Arrival date & time: 12/13/16  [id number]     History   Chief Complaint Chief Complaint  Patient presents with  . Chest Pain    HPI Valerie Nash is a 71 y.o. female.  HPI   Patient is a 71 year old female with history of CAD, takusubos cardiomyopathy, and STEMI, remote ho breast cancer, hypertension hyperlipidemia presenting today with chest discomfort. Patient reports that her last night around 8 PM she had left-sided chest discomfort. Did not radiate no shortness of breath or diaphoresis.  Patient reports she felt "off" the whole evening and was unable to sleep. She decided to come here this morning. Upon walking patient became increasing short of breath and the chest pain got worse.  On arrival the patient was hypoxic after walking from her car to the triage area.  Past Medical History:  Diagnosis Date  . Anxiety   . 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 CAD (coronary artery disease)    nonobstructive AS-30% mid LAD otherwise normal  . Cancer ([place])   . Cardiomyopathy ([place])   . Colon polyps 2004   4 adenomatous polyps  . Depression   . Dyslipidemia   . Hypertension   . IBS (irritable bowel syndrome)   . Insomnia   . NSTEMI (non-ST elevated myocardial infarction) ([place])   . Obesity (BMI 30-39.9)   . RBBB (right bundle branch block with left anterior fascicular block)   . Sinusitis   . Takotsubo cardiomyopathy   . Takotsubo cardiomyopathy 12/2012   EF 35%    Patient Active Problem List   Diagnosis Date Noted  . MI, old 06/21/2014  . Depression   . BBB (bundle branch block)   . Dyslipidemia   . CAD (coronary artery disease)   . Takotsubo cardiomyopathy 12/26/2012  . HTN (hypertension) 12/24/2012  . Orthostatic hypotension 12/24/2012  . NSTEMI (non-ST elevated myocardial infarction) ([place]) 12/23/2012    Past Surgical History:  Procedure Laterality Date  . ABDOMINAL HYSTERECTOMY    . BREAST LUMPECTOMY    .  CESAREAN SECTION    . LEFT HEART CATHETERIZATION WITH CORONARY ANGIOGRAM Bilateral 12/26/2012   Procedure: LEFT HEART CATHETERIZATION WITH CORONARY ANGIOGRAM;  Surgeon: Sueanne Margarita, MD;  Location: [facility] CATH [facility];  Service: Cardiovascular;  Laterality: Bilateral;  . SHOULDER SURGERY      OB History    No data available       Home Medications    Prior to Admission medications   Medication Sig Start Date End Date Taking? Authorizing Provider  aspirin EC 81 MG tablet Take 81 mg by mouth at bedtime.    Historical Provider, MD  Biotin 5000 MCG CAPS Take 5,000 mcg by mouth daily.    Historical Provider, MD  CALCIUM-VITAMIN D PO Take 1 tablet by mouth daily.    Historical Provider, MD  carvedilol (COREG) 3.125 MG tablet TAKE 1 TABLET BY MOUTH TWICE DAILY WITH MEALS 04/04/15   Sueanne Margarita, MD  Coenzyme Q10 (CO Q10) 100 MG CAPS Take 200 mg by mouth daily.    Historical Provider, MD  Cyanocobalamin (VITAMIN B 12 PO) Take 1 tablet by mouth daily.    Historical Provider, MD  dicyclomine (BENTYL) 10 MG capsule Take 1 capsule (10 mg total) by mouth 3 (three) times daily before meals. 07/24/15   Ladene Artist, MD  ezetimibe (ZETIA) 10 MG tablet Take 1 tablet (10 mg total) by mouth daily. 12/15/13  Sueanne Margarita, MD  fish oil-omega-3 fatty acids 1000 MG capsule Take 1,000 mg by mouth daily.    Historical Provider, MD  Multiple Vitamin (MULTIVITAMIN WITH MINERALS) TABS Take 1 tablet by mouth daily.    Historical Provider, MD  rosuvastatin (CRESTOR) 10 MG tablet Take 10 mg by mouth once a week. 03/13/14   Sueanne Margarita, MD  venlafaxine (EFFEXOR) 75 MG tablet Take 75 mg by mouth daily.    Historical Provider, MD    Family History Family History  Problem Relation Age of Onset  . Congestive Heart Failure Mother   . Colon cancer Father 68  . Breast cancer Sister 29  . Colon polyps Brother     2 total  . Lung cancer Maternal Uncle     3ppd smoker  . Congestive Heart Failure Maternal Grandmother     . Lung cancer Maternal Grandfather   . Cervical cancer Daughter 30    Social History Social History  Substance Use Topics  . Smoking status: Former Smoker    Packs/day: 0.00    Years: 29.00    Types: Cigarettes    Quit date: 02/17/1995  . Smokeless tobacco: Never Used  . Alcohol use Yes     Comment: socially     Allergies   Ambien [zolpidem tartrate]; Pravastatin; Simvastatin; and Topamax [topiramate]   Review of Systems Review of Systems  Constitutional: Positive for fatigue. Negative for activity change and fever.  HENT: Negative for congestion.   Respiratory: Positive for chest tightness and shortness of breath.   Cardiovascular: Positive for chest pain. Negative for palpitations and leg swelling.  Gastrointestinal: Negative for abdominal pain.  Neurological: Negative for dizziness and headaches.  All other systems reviewed and are negative.    Physical Exam Updated Vital Signs BP 111/83   Pulse 101   Temp 98.4 F (36.9 C) (Oral)   Resp 23   SpO2 94%   Physical Exam  Constitutional: She is oriented to person, place, and time. She appears well-developed and well-nourished.  HENT:  Head: Normocephalic and atraumatic.  Eyes: Right eye exhibits no discharge.  Cardiovascular: Regular rhythm and normal heart sounds.   No murmur heard. tachycardia  Pulmonary/Chest: Effort normal and breath sounds normal. She has no wheezes. She has no rales. She exhibits no tenderness.  Abdominal: Soft. She exhibits no distension. There is no tenderness.  Neurological: She is oriented to person, place, and time.  Skin: Skin is warm and dry. She is not diaphoretic.  Psychiatric: She has a normal mood and affect.  Nursing note and vitals reviewed.    ED Treatments / Results  Labs (all labs ordered are listed, but only abnormal results are displayed) Labs Reviewed  BASIC METABOLIC PANEL - Abnormal; Notable for the following:       Result Value   Glucose, Bld 153 (*)    All  other components within normal limits  CBC - Abnormal; Notable for the following:    WBC 16.3 (*)    RBC 5.14 (*)    All other components within normal limits  TROPONIN I - Abnormal; Notable for the following:    Troponin I 0.91 (*)    All other components within normal limits  D-DIMER, QUANTITATIVE (NOT AT [facility]) - Abnormal; Notable for the following:    D-Dimer, Quant 0.65 (*)    All other components within normal limits  BRAIN NATRIURETIC PEPTIDE - Abnormal; Notable for the following:    B Natriuretic Peptide 461.5 (*)  All other components within normal limits  URINE CULTURE  URINALYSIS, ROUTINE W REFLEX MICROSCOPIC    EKG  EKG Interpretation  Date/Time:  Sunday December 13 2016 06:29:16 EST Ventricular Rate:  113 PR Interval:    QRS Duration: 129 QT Interval:  471 QTC Calculation: 646 R Axis:   -73 Text Interpretation:  Sinus tachycardia LAE, consider biatrial enlargement Right bundle branch block ? rate related ischemia. Reconfirmed by Gerald Leitz ([id number]) on 12/13/2016 7:27:37 AM       Radiology Dg Chest 2 View  Result Date: 12/13/2016 CLINICAL DATA:  Labs/EKG will try again shortly Patient is a 71 year old female with history of CAD, Takotsubo cardiomyopathy, and STEMI, remote hx breast cancer, hypertension hyperlipidemia presenting today with chest discomfort. EXAM: CHEST  2 VIEW COMPARISON:  12/23/2012 FINDINGS: Heart size is within normal limits. The lungs are free of focal consolidations and pleural effusions. No pulmonary edema. Mid thoracic spondylosis. IMPRESSION: No evidence for acute cardiopulmonary abnormality. Electronically Signed   By: Nolon Nations M.D.   On: 12/13/2016 07:37   Ct Angio Chest Pe W And/or Wo Contrast  Result Date: 12/13/2016 CLINICAL DATA:  Chest pain. EXAM: CT ANGIOGRAPHY CHEST WITH CONTRAST TECHNIQUE: Multidetector CT imaging of the chest was performed using the standard protocol during bolus administration of intravenous contrast.  Multiplanar CT image reconstructions and MIPs were obtained to evaluate the vascular anatomy. CONTRAST:  100 mL of Isovue 370 intravenously. COMPARISON:  Radiographs of same day. FINDINGS: Cardiovascular: Satisfactory opacification of the pulmonary arteries to the segmental level. No evidence of pulmonary embolism. Normal heart size. No pericardial effusion. Mediastinum/Nodes: No enlarged mediastinal, hilar, or axillary lymph nodes. Thyroid gland, trachea, and esophagus demonstrate no significant findings. Lungs/Pleura: Lungs are clear. No pleural effusion or pneumothorax. Upper Abdomen: No acute abnormality. Musculoskeletal: No chest wall abnormality. No acute or significant osseous findings. Review of the MIP images confirms the above findings. IMPRESSION: No definite evidence of pulmonary embolus. No acute abnormality seen in the chest. Electronically Signed   By: Marijo Conception, M.D.   On: 12/13/2016 08:41    Procedures Procedures (including critical care time)  Medications Ordered in ED Medications  aspirin chewable tablet 324 mg (not administered)  sodium chloride 0.9 % bolus 500 mL (0 mLs Intravenous Stopped 12/13/16 0842)  iopamidol (ISOVUE-370) 76 % injection 100 mL (100 mLs Intravenous Contrast Given 12/13/16 0759)     Initial Impression / Assessment and Plan / ED Course  I have reviewed the triage vital signs and the nursing notes.  Pertinent labs & imaging results that were available during my care of the patient were reviewed by me and considered in my medical decision making (see chart for details).     Isn't very pleasant 71 year old female with hypertension hyperlipidemia history of cardiomyopathy and an STEMI presenting today with chest pain. On arrival here patient was hypoxic and tachycardic. Patient has no risk factors for a pulmonary embolism including on estrogens recent travel or recent surgeries. We will do d-dimer.  Patient says the pain feels slightly similar to the last  time that she has takusubos. At that time the inciting event was trying to get out of the pool (strained). Patient reports she did strain yesterday to pick up an accent piece of furniture.  We'll get initial troponin, EKG, d-dimer.  Ddimer positive, CT ordered.  Angio negtaive.  Trop positive.  Will heparin, asa.  Discussed with Dr. Meda Coffee, will admit to step down [facility] for further management.  CRITICAL CARE Performed by: Gardiner Sleeper Total critical care time: 45 minutes Critical care time was exclusive of separately billable procedures and treating other patients. Critical care was necessary to treat or prevent imminent or life-threatening deterioration. Critical care was time spent personally by me on the following activities: development of treatment plan with patient and/or surrogate as well as nursing, discussions with consultants, evaluation of patient's response to treatment, examination of patient, obtaining history from patient or surrogate, ordering and performing treatments and interventions, ordering and review of laboratory studies, ordering and review of radiographic studies, pulse oximetry and re-evaluation of patient's condition.   Final Clinical Impressions(s) / ED Diagnoses   Final diagnoses:  None    New Prescriptions New Prescriptions   No medications on file     Kairen Hallinan Julio Alm, MD 12/13/16 [phone]

## 2016-12-13 NOTE — ED Notes (Signed)
Pt placed on O2@2L  due to sats of 89%. RN and MD notified.

## 2016-12-14 ENCOUNTER — Encounter (HOSPITAL_COMMUNITY)
Admission: EM | Disposition: A | Discharge status: Home / Self Care | Payer: Self-pay | Source: Home / Self Care | Attending: Cardiology

## 2016-12-14 DIAGNOSIS — R002 Palpitations: Secondary | ICD-10-CM

## 2016-12-14 DIAGNOSIS — E785 Hyperlipidemia, unspecified: Secondary | ICD-10-CM

## 2016-12-14 DIAGNOSIS — I428 Other cardiomyopathies: Secondary | ICD-10-CM

## 2016-12-14 DIAGNOSIS — R079 Chest pain, unspecified: Secondary | ICD-10-CM

## 2016-12-14 HISTORY — PX: RIGHT/LEFT HEART CATH AND CORONARY ANGIOGRAPHY: CATH118266

## 2016-12-14 LAB — BASIC METABOLIC PANEL
Anion gap: 5 (ref 5–15)
BUN: 14 mg/dL (ref 6–20)
CO2: 25 mmol/L (ref 22–32)
Calcium: 8.7 mg/dL — ABNORMAL LOW (ref 8.9–10.3)
Chloride: 109 mmol/L (ref 101–111)
Creatinine, Ser: 0.81 mg/dL (ref 0.44–1.00)
GFR calc Af Amer: >60 mL/min (ref >60)
GFR calc non Af Amer: >60 mL/min (ref >60)
Glucose, Bld: 120 mg/dL — ABNORMAL HIGH (ref 65–99)
Potassium: 3.3 mmol/L — ABNORMAL LOW (ref 3.5–5.1)
Sodium: 139 mmol/L (ref 135–145)

## 2016-12-14 LAB — POCT I-STAT 3, ART BLOOD GAS (G3+)
Bicarbonate: 23.8 mmol/L (ref 20.0–28.0)
O2 Saturation: 94 %
TCO2: 25 mmol/L (ref 0–100)
pCO2 arterial: 35.7 mmHg (ref 32.0–48.0)
pH, Arterial: 7.432 (ref 7.350–7.450)
pO2, Arterial: 69 mmHg — ABNORMAL LOW (ref 83.0–108.0)

## 2016-12-14 LAB — TROPONIN I: Troponin I: 0.57 ng/mL (ref <0.03)

## 2016-12-14 LAB — LIPID PANEL
Cholesterol: 209 mg/dL — ABNORMAL HIGH (ref 0–200)
HDL: 55 mg/dL (ref >40)
LDL Cholesterol: 135 mg/dL — ABNORMAL HIGH (ref 0–99)
Total CHOL/HDL Ratio: 3.8 RATIO
Triglycerides: 96 mg/dL (ref <150)
VLDL: 19 mg/dL (ref 0–40)

## 2016-12-14 LAB — CBC
HCT: 41 % (ref 36.0–46.0)
Hemoglobin: 13.2 g/dL (ref 12.0–15.0)
MCH: 27.4 pg (ref 26.0–34.0)
MCHC: 32.2 g/dL (ref 30.0–36.0)
MCV: 85.2 fL (ref 78.0–100.0)
Platelets: 253 10*3/uL (ref 150–400)
RBC: 4.81 MIL/uL (ref 3.87–5.11)
RDW: 14 % (ref 11.5–15.5)
WBC: 13.9 10*3/uL — ABNORMAL HIGH (ref 4.0–10.5)

## 2016-12-14 LAB — POCT I-STAT 3, VENOUS BLOOD GAS (G3P V)
Bicarbonate: 24.9 mmol/L (ref 20.0–28.0)
O2 Saturation: 69 %
TCO2: 26 mmol/L (ref 0–100)
pCO2, Ven: 40.5 mmHg — ABNORMAL LOW (ref 44.0–60.0)
pH, Ven: 7.397 (ref 7.250–7.430)
pO2, Ven: 36 mmHg (ref 32.0–45.0)

## 2016-12-14 LAB — PROTIME-INR
INR: 1.07
Prothrombin Time: 13.9 seconds (ref 11.4–15.2)

## 2016-12-14 LAB — HEPARIN LEVEL (UNFRACTIONATED): Heparin Unfractionated: 0.43 IU/mL (ref 0.30–0.70)

## 2016-12-14 LAB — POCT ACTIVATED CLOTTING TIME: Activated Clotting Time: 147 seconds

## 2016-12-14 LAB — HEMOGLOBIN A1C
Hgb A1c MFr Bld: 5.8 % — ABNORMAL HIGH (ref 4.8–5.6)
Mean Plasma Glucose: 120 mg/dL

## 2016-12-14 SURGERY — RIGHT/LEFT HEART CATH AND CORONARY ANGIOGRAPHY

## 2016-12-14 MED ORDER — ACETAMINOPHEN 325 MG PO TABS: 650.0000 mg | ORAL_TABLET | ORAL | Status: DC | PRN

## 2016-12-14 MED ORDER — ASPIRIN EC 81 MG PO TBEC: 81.0000 mg | DELAYED_RELEASE_TABLET | Freq: Every day | ORAL | Status: DC

## 2016-12-14 MED ORDER — FENTANYL CITRATE (PF) 100 MCG/2ML IJ SOLN
INTRAMUSCULAR | Status: DC | PRN
  Administered 2016-12-14: 25 ug via INTRAVENOUS

## 2016-12-14 MED ORDER — LIDOCAINE HCL (PF) 1 % IJ SOLN
INTRAMUSCULAR | Status: AC
  Filled 2016-12-14: qty 30

## 2016-12-14 MED ORDER — ASPIRIN 81 MG PO CHEW
81.0000 mg | CHEWABLE_TABLET | Freq: Every day | ORAL | Status: DC
  Administered 2016-12-15 – 2016-12-16 (×2): 81 mg via ORAL
  Filled 2016-12-14 (×2): qty 1

## 2016-12-14 MED ORDER — ASPIRIN 81 MG PO CHEW: 81.0000 mg | CHEWABLE_TABLET | ORAL | Status: DC

## 2016-12-14 MED ORDER — FENTANYL CITRATE (PF) 100 MCG/2ML IJ SOLN
INTRAMUSCULAR | Status: AC
  Filled 2016-12-14: qty 2

## 2016-12-14 MED ORDER — MIDAZOLAM HCL 2 MG/2ML IJ SOLN
INTRAMUSCULAR | Status: DC | PRN
  Administered 2016-12-14: 1 mg via INTRAVENOUS

## 2016-12-14 MED ORDER — MORPHINE SULFATE (PF) 2 MG/ML IV SOLN: 2.0000 mg | INTRAVENOUS | Status: DC | PRN

## 2016-12-14 MED ORDER — SODIUM CHLORIDE 0.9% FLUSH
3.0000 mL | Freq: Two times a day (BID) | INTRAVENOUS | Status: DC
Start: 2016-12-14 — End: 2016-12-14
  Administered 2016-12-14: 3 mL via INTRAVENOUS

## 2016-12-14 MED ORDER — IOPAMIDOL (ISOVUE-370) INJECTION 76%
INTRAVENOUS | Status: DC | PRN
  Administered 2016-12-14: 35 mL via INTRA_ARTERIAL

## 2016-12-14 MED ORDER — SODIUM CHLORIDE 0.9 % IV SOLN: 250.0000 mL | INTRAVENOUS | Status: DC | PRN

## 2016-12-14 MED ORDER — SODIUM CHLORIDE 0.9 % IV SOLN
INTRAVENOUS | Status: AC
  Administered 2016-12-14: 19:00:00 via INTRAVENOUS

## 2016-12-14 MED ORDER — DICYCLOMINE HCL 10 MG PO CAPS
10.0000 mg | ORAL_CAPSULE | ORAL | Status: DC | PRN
  Filled 2016-12-14: qty 1

## 2016-12-14 MED ORDER — SODIUM CHLORIDE 0.9% FLUSH: 3.0000 mL | INTRAVENOUS | Status: DC | PRN

## 2016-12-14 MED ORDER — SODIUM CHLORIDE 0.9% FLUSH
3.0000 mL | Freq: Two times a day (BID) | INTRAVENOUS | Status: DC
  Administered 2016-12-15 (×2): 3 mL via INTRAVENOUS

## 2016-12-14 MED ORDER — HEPARIN (PORCINE) IN NACL 2-0.9 UNIT/ML-% IJ SOLN
INTRAMUSCULAR | Status: AC
  Filled 2016-12-14: qty 1000

## 2016-12-14 MED ORDER — POTASSIUM CHLORIDE CRYS ER 20 MEQ PO TBCR
40.0000 meq | EXTENDED_RELEASE_TABLET | Freq: Once | ORAL | Status: AC
  Administered 2016-12-14: 40 meq via ORAL
  Filled 2016-12-14: qty 2

## 2016-12-14 MED ORDER — MIDAZOLAM HCL 2 MG/2ML IJ SOLN
INTRAMUSCULAR | Status: AC
  Filled 2016-12-14: qty 2

## 2016-12-14 MED ORDER — ONDANSETRON HCL 4 MG/2ML IJ SOLN: 4.0000 mg | Freq: Four times a day (QID) | INTRAMUSCULAR | Status: DC | PRN

## 2016-12-14 MED ORDER — SODIUM CHLORIDE 0.9 % IV SOLN: INTRAVENOUS | Status: DC

## 2016-12-14 MED ORDER — SODIUM CHLORIDE 0.9 % IV SOLN
INTRAVENOUS | Status: DC | PRN
  Administered 2016-12-14: 10 mL/h via INTRAVENOUS

## 2016-12-14 MED ORDER — HEPARIN (PORCINE) IN NACL 2-0.9 UNIT/ML-% IJ SOLN
INTRAMUSCULAR | Status: DC | PRN
  Administered 2016-12-14: 1000 mL

## 2016-12-14 MED ORDER — LIDOCAINE HCL (PF) 1 % IJ SOLN
INTRAMUSCULAR | Status: DC | PRN
  Administered 2016-12-14: 15 mL

## 2016-12-14 MED ORDER — IOPAMIDOL (ISOVUE-370) INJECTION 76%
INTRAVENOUS | Status: AC
  Filled 2016-12-14: qty 100

## 2016-12-14 SURGICAL SUPPLY — 11 items
CATH INFINITI 5FR MULTPACK ANG (CATHETERS) ×2 IMPLANT
CATH SWAN GANZ 7F STRAIGHT (CATHETERS) ×2 IMPLANT
DEVICE CLOSURE MYNXGRIP 5F (Vascular Products) ×2 IMPLANT
KIT HEART LEFT (KITS) ×2 IMPLANT
PACK CARDIAC CATHETERIZATION (CUSTOM PROCEDURE TRAY) ×2 IMPLANT
SHEATH PINNACLE 5F 10CM (SHEATH) ×2 IMPLANT
SHEATH PINNACLE 7F 10CM (SHEATH) ×2 IMPLANT
SYR MEDRAD MARK V 150ML (SYRINGE) IMPLANT
TRANSDUCER W/STOPCOCK (MISCELLANEOUS) ×2 IMPLANT
TUBING CIL FLEX 10 FLL-RA (TUBING) ×2 IMPLANT
WIRE EMERALD 3MM-J .035X150CM (WIRE) ×2 IMPLANT

## 2016-12-14 NOTE — Care Management Note (Signed)
Case Management Note  Patient Details  Name: Valerie Nash MRN: [id number] Date of Birth: 06/28/1946  Subjective/Objective:                    Action/Plan:  PTA completely independent stays alone.  Daughters are close by for support if needed.  Pt has active insurance, relationship with PCP and denied barrier to obtaining medications.  Pt independently ambulating in room.  Cm will continue to follow for discharge needs   Expected Discharge Date:                  Expected Discharge Plan:  Home/Self Care  In-House Referral:     Discharge planning Services  CM Consult  Post Acute Care Choice:    Choice offered to:     DME Arranged:    DME Agency:     HH Arranged:    HH Agency:     Status of Service:  In process, will continue to follow  If discussed at Long Length of Stay Meetings, dates discussed:    Additional Comments:  Maryclare Labrador, RN 12/14/2016, 10:59 AM

## 2016-12-14 NOTE — Progress Notes (Signed)
Pt back from cath lab. Femoral site level 0. Vital signs stable. Will continue to monitor pt.

## 2016-12-14 NOTE — Progress Notes (Signed)
Progress Note  Patient Name: Valerie Nash Date of Encounter: 12/14/2016  Primary Cardiologist: Dr. Radford Pax  Subjective   Feeling well. No chest pain, sob or palpitations.   Inpatient Medications    Scheduled Meds: . aspirin  324 mg Oral NOW   Or  . aspirin  300 mg Rectal NOW  . aspirin EC  81 mg Oral QHS  . aspirin EC  81 mg Oral Daily  . carvedilol  3.125 mg Oral BID WC  . dicyclomine  10 mg Oral TID AC  . omega-3 acid ethyl esters  1,000 mg Oral Daily  . potassium chloride  40 mEq Oral Once  . rosuvastatin  10 mg Oral Weekly  . sodium chloride flush  3 mL Intravenous Q12H  . venlafaxine XR  75 mg Oral q1800   Continuous Infusions: . heparin 850 Units/hr (12/14/16 1100)   PRN Meds: sodium chloride, acetaminophen, nitroGLYCERIN, ondansetron (ZOFRAN) IV, sodium chloride flush   Vital Signs    Vitals:   12/14/16 0407 12/14/16 0729 12/14/16 0800 12/14/16 1103  BP: (!) 88/64 (!) 94/59 98/60 109/72  Pulse: 76 71 74 80  Resp: 20 16  20   Temp:  98.2 F (36.8 C)  98.4 F (36.9 C)  TempSrc:  Oral  Oral  SpO2: 93% 95%  94%  Weight:      Height:        Intake/Output Summary (Last 24 hours) at 12/14/16 1153 Last data filed at 12/14/16 0600  Gross per 24 hour  Intake           322.26 ml  Output              150 ml  Net           172.26 ml   Filed Weights   12/13/16 0856 12/13/16 1530  Weight: 166 lb (75.3 kg) 164 lb 14.5 oz (74.8 kg)    Telemetry    Sinus rhythm at rate of 80s intermittent tachycardia at rate of 110s - Personally Reviewed  ECG    Sinus rhythm with TWI in inferior lateral leads - Personally Reviewed  Physical Exam   GEN: No acute distress.   Neck: No JVD Cardiac: RRR, no murmurs, rubs, or gallops.  Respiratory: Clear to auscultation bilaterally. GI: Soft, nontender, non-distended  MS: No edema; No deformity. Neuro:  Nonfocal  Psych: Normal affect   Labs    Chemistry Recent Labs Lab 12/13/16 0652 12/14/16 0441  NA 141 139  K  3.6 3.3*  CL 110 109  CO2 22 25  GLUCOSE 153* 120*  BUN 15 14  CREATININE 0.78 0.81  CALCIUM 9.0 8.7*  GFRNONAA >60 >60  GFRAA >60 >60  ANIONGAP 9 5     Hematology Recent Labs Lab 12/13/16 0652 12/14/16 0441  WBC 16.3* 13.9*  RBC 5.14* 4.81  HGB 14.4 13.2  HCT 43.2 41.0  MCV 84.0 85.2  MCH 28.0 27.4  MCHC 33.3 32.2  RDW 13.8 14.0  PLT 303 253    Cardiac Enzymes Recent Labs Lab 12/13/16 1308 12/13/16 1643 12/13/16 2213 12/14/16 0441  TROPONINI 0.86* 0.80* 0.69* 0.57*   No results for input(s): TROPIPOC in the last 168 hours.   BNP Recent Labs Lab 12/13/16 0652  BNP 461.5*     DDimer  Recent Labs Lab 12/13/16 0652  DDIMER 0.65*     Radiology    Dg Chest 2 View  Result Date: 12/13/2016 CLINICAL DATA:  Labs/EKG will try again shortly Patient  is a 71 year old female with history of CAD, Takotsubo cardiomyopathy, and STEMI, remote hx breast cancer, hypertension hyperlipidemia presenting today with chest discomfort. EXAM: CHEST  2 VIEW COMPARISON:  12/23/2012 FINDINGS: Heart size is within normal limits. The lungs are free of focal consolidations and pleural effusions. No pulmonary edema. Mid thoracic spondylosis. IMPRESSION: No evidence for acute cardiopulmonary abnormality. Electronically Signed   By: Nolon Nations M.D.   On: 12/13/2016 07:37   Ct Angio Chest Pe W And/or Wo Contrast  Result Date: 12/13/2016 CLINICAL DATA:  Chest pain. EXAM: CT ANGIOGRAPHY CHEST WITH CONTRAST TECHNIQUE: Multidetector CT imaging of the chest was performed using the standard protocol during bolus administration of intravenous contrast. Multiplanar CT image reconstructions and MIPs were obtained to evaluate the vascular anatomy. CONTRAST:  100 mL of Isovue 370 intravenously. COMPARISON:  Radiographs of same day. FINDINGS: Cardiovascular: Satisfactory opacification of the pulmonary arteries to the segmental level. No evidence of pulmonary embolism. Normal heart size. No pericardial  effusion. Mediastinum/Nodes: No enlarged mediastinal, hilar, or axillary lymph nodes. Thyroid gland, trachea, and esophagus demonstrate no significant findings. Lungs/Pleura: Lungs are clear. No pleural effusion or pneumothorax. Upper Abdomen: No acute abnormality. Musculoskeletal: No chest wall abnormality. No acute or significant osseous findings. Review of the MIP images confirms the above findings. IMPRESSION: No definite evidence of pulmonary embolus. No acute abnormality seen in the chest. Electronically Signed   By: Marijo Conception, M.D.   On: 12/13/2016 08:41    Cardiac Studies   Echo 12/13/16 Study Conclusions  - Left ventricle: The cavity size was normal. Systolic function was   severely reduced. The estimated ejection fraction was in the   range of 15-20%. All basal segments and mid inferior walls are   hyperdynamic. All the other mid and apical segments are akinetic.   Features are consistent with a pseudonormal left ventricular   filling pattern, with concomitant abnormal relaxation and   increased filling pressure (grade 2 diastolic dysfunction).   Doppler parameters are consistent with elevated ventricular   end-diastolic filling pressure. - Aortic valve: There was mild regurgitation. - Mitral valve: There was mild regurgitation. - Left atrium: The atrium was normal in size. - Right ventricle: Systolic function was normal. - Right atrium: The atrium was normal in size. - Tricuspid valve: There was moderate regurgitation. - Pulmonic valve: There was moderate regurgitation. - Pulmonary arteries: Systolic pressure was severely increased. PA   peak pressure: 52 mm Hg (S). - Inferior vena cava: The vessel was normal in size. - Pericardium, extracardiac: There was no pericardial effusion.  Impressions:  - All basal segments and mid inferior walls are hyperdynamic. All   the other mid and apical segments are akinetic. The apical and   mid portion of left ventricle is  ballooned.   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   Severe pulmonary hypertension.  Patient Profile     Valerie Nash is a 71 y.o. female with a history of NSTEMI with Takotsubo CM (2014), nonobstructive ASCAD, HTN, dyslipidemia, and RBBB who was transferred from [facility] to [facility] today for evaluation of chest pain with elevated cardiac enzymes.   Assessment & Plan    1. NSTEMI:  0.90--> 0.71--> 0.46--> 0.91--> 0.86-->0.8-->0.69-->0.57. Continue IV heparin. Chest pain free now. Echo showed EF further depressed. Will do L & R cath today.   2. Acute on chronic systolic CHF - BNP of 123456. No orthopnea or PND. Volume status  ok. For R cath today.   3. Palpitations - Intermittent atrial tachycardia. Continue to monitor on telemetry. K 3.3. Supplement given. Mg 2.2  4. HLD - 12/14/2016: Cholesterol 209; HDL 55; LDL Cholesterol 135; Triglycerides 96; VLDL 19  - Crestor 10mg  started this admission. Discontinued 2 years ago. IF CAD, up titrate further. Continue Zetia. Seen in lipid clinic in past. ? candidate for the PSK9 inhibitor  5. HTN - Hypotensive. Follow closely. Continue low dose BB. Hold of SBP less than 90.  Signed, Leanor Kail, PA  12/14/2016, 11:53 AM

## 2016-12-14 NOTE — Progress Notes (Signed)
ANTICOAGULATION CONSULT NOTE - Follow Up Consult  Pharmacy Consult for Heparin Indication: chest pain/ACS  Allergies  Allergen Reactions  . Ambien [Zolpidem Tartrate] Other (See Comments)    Nighttime overeating  . Pravastatin Other (See Comments)    Muscle aches   . Simvastatin Other (See Comments)    Memory impairment  . Topamax [Topiramate] Swelling    Tongue swelling    Patient Measurements: Height: 5\' 3"  (160 cm) Weight: 164 lb 14.5 oz (74.8 kg) IBW/kg (Calculated) : 52.4 Heparin Dosing Weight:  68.3 kg  Vital Signs: Temp: 98.2 F (36.8 C) (03/05 0729) Temp Source: Oral (03/05 0729) BP: 94/59 (03/05 0729) Pulse Rate: 71 (03/05 0729)  Labs:  Recent Labs  12/13/16 0652  12/13/16 1643 12/13/16 1839 12/13/16 2213 12/14/16 0441  HGB 14.4  --   --   --   --  13.2  HCT 43.2  --   --   --   --  41.0  PLT 303  --   --   --   --  253  LABPROT  --   --  12.9  --   --  13.9  INR  --   --  0.97  --   --  1.07  HEPARINUNFRC  --   --   --  0.42  --  0.43  CREATININE 0.78  --   --   --   --  0.81  TROPONINI 0.91*  < > 0.80*  --  0.69* 0.57*  < > = values in this interval not displayed.  Estimated Creatinine Clearance: 62.6 mL/min (by C-G formula based on SCr of 0.81 mg/dL).   Assessment:  71 yo F presents on 3/4 with chest pain and noted with NSTEMI.  Pharmacy consulted to dose heparin. Plans noted for possible cath -heparin level= 0.43   Goal of Therapy:  Heparin level 0.3-0.7 units/ml Monitor platelets by anticoagulation protocol: Yes   Plan:  Continue heparin gtt at 850 units/hr Monitor daily heparin level, CBC  Hildred Laser, Pharm D 12/14/2016 9:27 AM

## 2016-12-14 NOTE — Interval H&P Note (Signed)
Cath Lab Visit (complete for each Cath Lab visit)  Clinical Evaluation Leading to the Procedure:   ACS: Yes.    Non-ACS:    Anginal Classification: CCS III  Anti-ischemic medical therapy: Minimal Therapy (1 class of medications)  Non-Invasive Test Results: No non-invasive testing performed  Prior CABG: No previous CABG      History and Physical Interval Note:  12/14/2016 5:23 PM  Valerie Nash  has presented today for surgery, with the diagnosis of low ef  The various methods of treatment have been discussed with the patient and family. After consideration of risks, benefits and other options for treatment, the patient has consented to  Procedure(s): Right/Left Heart Cath and Coronary Angiography (N/A) as a surgical intervention .  The patient's history has been reviewed, patient examined, no change in status, stable for surgery.  I have reviewed the patient's chart and labs.  Questions were answered to the patient's satisfaction.     [name]

## 2016-12-14 NOTE — H&P (View-Only) (Signed)
Progress Note  Patient Name: Valerie Nash Date of Encounter: 12/14/2016  Primary Cardiologist: Dr. Radford Pax  Subjective   Feeling well. No chest pain, sob or palpitations.   Inpatient Medications    Scheduled Meds: . aspirin  324 mg Oral NOW   Or  . aspirin  300 mg Rectal NOW  . aspirin EC  81 mg Oral QHS  . aspirin EC  81 mg Oral Daily  . carvedilol  3.125 mg Oral BID WC  . dicyclomine  10 mg Oral TID AC  . omega-3 acid ethyl esters  1,000 mg Oral Daily  . potassium chloride  40 mEq Oral Once  . rosuvastatin  10 mg Oral Weekly  . sodium chloride flush  3 mL Intravenous Q12H  . venlafaxine XR  75 mg Oral q1800   Continuous Infusions: . heparin 850 Units/hr (12/14/16 1100)   PRN Meds: sodium chloride, acetaminophen, nitroGLYCERIN, ondansetron (ZOFRAN) IV, sodium chloride flush   Vital Signs    Vitals:   12/14/16 0407 12/14/16 0729 12/14/16 0800 12/14/16 1103  BP: (!) 88/64 (!) 94/59 98/60 109/72  Pulse: 76 71 74 80  Resp: 20 16  20   Temp:  98.2 F (36.8 C)  98.4 F (36.9 C)  TempSrc:  Oral  Oral  SpO2: 93% 95%  94%  Weight:      Height:        Intake/Output Summary (Last 24 hours) at 12/14/16 1153 Last data filed at 12/14/16 0600  Gross per 24 hour  Intake           322.26 ml  Output              150 ml  Net           172.26 ml   Filed Weights   12/13/16 0856 12/13/16 1530  Weight: 166 lb (75.3 kg) 164 lb 14.5 oz (74.8 kg)    Telemetry    Sinus rhythm at rate of 80s intermittent tachycardia at rate of 110s - Personally Reviewed  ECG    Sinus rhythm with TWI in inferior lateral leads - Personally Reviewed  Physical Exam   GEN: No acute distress.   Neck: No JVD Cardiac: RRR, no murmurs, rubs, or gallops.  Respiratory: Clear to auscultation bilaterally. GI: Soft, nontender, non-distended  MS: No edema; No deformity. Neuro:  Nonfocal  Psych: Normal affect   Labs    Chemistry Recent Labs Lab 12/13/16 0652 12/14/16 0441  NA 141 139  K  3.6 3.3*  CL 110 109  CO2 22 25  GLUCOSE 153* 120*  BUN 15 14  CREATININE 0.78 0.81  CALCIUM 9.0 8.7*  GFRNONAA >60 >60  GFRAA >60 >60  ANIONGAP 9 5     Hematology Recent Labs Lab 12/13/16 0652 12/14/16 0441  WBC 16.3* 13.9*  RBC 5.14* 4.81  HGB 14.4 13.2  HCT 43.2 41.0  MCV 84.0 85.2  MCH 28.0 27.4  MCHC 33.3 32.2  RDW 13.8 14.0  PLT 303 253    Cardiac Enzymes Recent Labs Lab 12/13/16 1308 12/13/16 1643 12/13/16 2213 12/14/16 0441  TROPONINI 0.86* 0.80* 0.69* 0.57*   No results for input(s): TROPIPOC in the last 168 hours.   BNP Recent Labs Lab 12/13/16 0652  BNP 461.5*     DDimer  Recent Labs Lab 12/13/16 0652  DDIMER 0.65*     Radiology    Dg Chest 2 View  Result Date: 12/13/2016 CLINICAL DATA:  Labs/EKG will try again shortly Patient  is a 71 year old female with history of CAD, Takotsubo cardiomyopathy, and STEMI, remote hx breast cancer, hypertension hyperlipidemia presenting today with chest discomfort. EXAM: CHEST  2 VIEW COMPARISON:  12/23/2012 FINDINGS: Heart size is within normal limits. The lungs are free of focal consolidations and pleural effusions. No pulmonary edema. Mid thoracic spondylosis. IMPRESSION: No evidence for acute cardiopulmonary abnormality. Electronically Signed   By: Nolon Nations M.D.   On: 12/13/2016 07:37   Ct Angio Chest Pe W And/or Wo Contrast  Result Date: 12/13/2016 CLINICAL DATA:  Chest pain. EXAM: CT ANGIOGRAPHY CHEST WITH CONTRAST TECHNIQUE: Multidetector CT imaging of the chest was performed using the standard protocol during bolus administration of intravenous contrast. Multiplanar CT image reconstructions and MIPs were obtained to evaluate the vascular anatomy. CONTRAST:  100 mL of Isovue 370 intravenously. COMPARISON:  Radiographs of same day. FINDINGS: Cardiovascular: Satisfactory opacification of the pulmonary arteries to the segmental level. No evidence of pulmonary embolism. Normal heart size. No pericardial  effusion. Mediastinum/Nodes: No enlarged mediastinal, hilar, or axillary lymph nodes. Thyroid gland, trachea, and esophagus demonstrate no significant findings. Lungs/Pleura: Lungs are clear. No pleural effusion or pneumothorax. Upper Abdomen: No acute abnormality. Musculoskeletal: No chest wall abnormality. No acute or significant osseous findings. Review of the MIP images confirms the above findings. IMPRESSION: No definite evidence of pulmonary embolus. No acute abnormality seen in the chest. Electronically Signed   By: Marijo Conception, M.D.   On: 12/13/2016 08:41    Cardiac Studies   Echo 12/13/16 Study Conclusions  - Left ventricle: The cavity size was normal. Systolic function was   severely reduced. The estimated ejection fraction was in the   range of 15-20%. All basal segments and mid inferior walls are   hyperdynamic. All the other mid and apical segments are akinetic.   Features are consistent with a pseudonormal left ventricular   filling pattern, with concomitant abnormal relaxation and   increased filling pressure (grade 2 diastolic dysfunction).   Doppler parameters are consistent with elevated ventricular   end-diastolic filling pressure. - Aortic valve: There was mild regurgitation. - Mitral valve: There was mild regurgitation. - Left atrium: The atrium was normal in size. - Right ventricle: Systolic function was normal. - Right atrium: The atrium was normal in size. - Tricuspid valve: There was moderate regurgitation. - Pulmonic valve: There was moderate regurgitation. - Pulmonary arteries: Systolic pressure was severely increased. PA   peak pressure: 52 mm Hg (S). - Inferior vena cava: The vessel was normal in size. - Pericardium, extracardiac: There was no pericardial effusion.  Impressions:  - All basal segments and mid inferior walls are hyperdynamic. All   the other mid and apical segments are akinetic. The apical and   mid portion of left ventricle is  ballooned.   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   Severe pulmonary hypertension.  Patient Profile     Valerie Nash is a 71 y.o. female with a history of NSTEMI with Takotsubo CM (2014), nonobstructive ASCAD, HTN, dyslipidemia, and RBBB who was transferred from [facility] to [facility] today for evaluation of chest pain with elevated cardiac enzymes.   Assessment & Plan    1. NSTEMI:  0.90--> 0.71--> 0.46--> 0.91--> 0.86-->0.8-->0.69-->0.57. Continue IV heparin. Chest pain free now. Echo showed EF further depressed. Will do L & R cath today.   2. Acute on chronic systolic CHF - BNP of 123456. No orthopnea or PND. Volume status  ok. For R cath today.   3. Palpitations - Intermittent atrial tachycardia. Continue to monitor on telemetry. K 3.3. Supplement given. Mg 2.2  4. HLD - 12/14/2016: Cholesterol 209; HDL 55; LDL Cholesterol 135; Triglycerides 96; VLDL 19  - Crestor 10mg  started this admission. Discontinued 2 years ago. IF CAD, up titrate further. Continue Zetia. Seen in lipid clinic in past. ? candidate for the PSK9 inhibitor  5. HTN - Hypotensive. Follow closely. Continue low dose BB. Hold of SBP less than 90.  Signed, Leanor Kail, PA  12/14/2016, 11:53 AM

## 2016-12-15 ENCOUNTER — Encounter (HOSPITAL_COMMUNITY): Payer: Self-pay | Admitting: Cardiovascular Disease

## 2016-12-15 DIAGNOSIS — R748 Abnormal levels of other serum enzymes: Secondary | ICD-10-CM

## 2016-12-15 LAB — BASIC METABOLIC PANEL
Anion gap: 5 (ref 5–15)
BUN: 17 mg/dL (ref 6–20)
CO2: 26 mmol/L (ref 22–32)
Calcium: 8.8 mg/dL — ABNORMAL LOW (ref 8.9–10.3)
Chloride: 108 mmol/L (ref 101–111)
Creatinine, Ser: 0.82 mg/dL (ref 0.44–1.00)
GFR calc Af Amer: >60 mL/min (ref >60)
GFR calc non Af Amer: >60 mL/min (ref >60)
Glucose, Bld: 97 mg/dL (ref 65–99)
Potassium: 4.5 mmol/L (ref 3.5–5.1)
Sodium: 139 mmol/L (ref 135–145)

## 2016-12-15 LAB — URINE CULTURE

## 2016-12-15 LAB — CBC
HCT: 40.8 % (ref 36.0–46.0)
Hemoglobin: 13 g/dL (ref 12.0–15.0)
MCH: 27.6 pg (ref 26.0–34.0)
MCHC: 31.9 g/dL (ref 30.0–36.0)
MCV: 86.6 fL (ref 78.0–100.0)
Platelets: 277 10*3/uL (ref 150–400)
RBC: 4.71 MIL/uL (ref 3.87–5.11)
RDW: 14.1 % (ref 11.5–15.5)
WBC: 11.1 10*3/uL — ABNORMAL HIGH (ref 4.0–10.5)

## 2016-12-15 MED ORDER — LISINOPRIL 2.5 MG PO TABS
2.5000 mg | ORAL_TABLET | Freq: Every day | ORAL | Status: DC
Start: 2016-12-15 — End: 2016-12-16
  Administered 2016-12-15 – 2016-12-16 (×2): 2.5 mg via ORAL
  Filled 2016-12-15 (×2): qty 1

## 2016-12-15 NOTE — Progress Notes (Signed)
Attempted to call report to [phone].

## 2016-12-15 NOTE — Progress Notes (Signed)
Progress Note  Patient Name: Valerie Nash Date of Encounter: 12/15/2016  Primary Cardiologist: Dr. Radford Pax  Subjective   Feeling well. No chest pain, sob or palpitations.   Inpatient Medications    Scheduled Meds: . aspirin  81 mg Oral Daily  . carvedilol  3.125 mg Oral BID WC  . omega-3 acid ethyl esters  1,000 mg Oral Daily  . rosuvastatin  10 mg Oral Weekly  . sodium chloride flush  3 mL Intravenous Q12H  . sodium chloride flush  3 mL Intravenous Q12H  . venlafaxine XR  75 mg Oral q1800   Continuous Infusions:  PRN Meds: sodium chloride, sodium chloride, acetaminophen, dicyclomine, morphine injection, nitroGLYCERIN, ondansetron (ZOFRAN) IV, sodium chloride flush, sodium chloride flush   Vital Signs    Vitals:   12/15/16 0334 12/15/16 0400 12/15/16 0700 12/15/16 1100  BP: 92/78 (!) 90/51 102/69 (!) 119/91  Pulse: 78 73 77 75  Resp: (!) 24 20 16 16   Temp: 98.7 F (37.1 C)  98.6 F (37 C) 97.8 F (36.6 C)  TempSrc: Oral  Oral Oral  SpO2: 95% 90% 93% 95%  Weight: 174 lb 11.2 oz (79.2 kg)     Height:        Intake/Output Summary (Last 24 hours) at 12/15/16 1149 Last data filed at 12/15/16 0800  Gross per 24 hour  Intake              510 ml  Output                0 ml  Net              510 ml   Filed Weights   12/13/16 0856 12/13/16 1530 12/15/16 0334  Weight: 166 lb (75.3 kg) 164 lb 14.5 oz (74.8 kg) 174 lb 11.2 oz (79.2 kg)    Telemetry    Sinus rhythm at rate of 80s intermittent nonsustained atrial tachycardia short bursts at rate of 110s - Personally Reviewed  ECG    Sinus rhythm with TWI in inferior lateral leads - Personally Reviewed  Physical Exam   GEN: No acute distress.   Neck: No JVD Cardiac: RRR, no murmurs, rubs, or gallops.  Respiratory: Clear to auscultation bilaterally. GI: Soft, nontender, non-distended  MS: No edema; No deformity. Neuro:  Nonfocal  Psych: Normal affect   Labs    Chemistry  Recent Labs Lab 12/13/16 0652  12/14/16 0441 12/15/16 0244  NA 141 139 139  K 3.6 3.3* 4.5  CL 110 109 108  CO2 22 25 26   GLUCOSE 153* 120* 97  BUN 15 14 17   CREATININE 0.78 0.81 0.82  CALCIUM 9.0 8.7* 8.8*  GFRNONAA >60 >60 >60  GFRAA >60 >60 >60  ANIONGAP 9 5 5      Hematology  Recent Labs Lab 12/13/16 0652 12/14/16 0441 12/15/16 0244  WBC 16.3* 13.9* 11.1*  RBC 5.14* 4.81 4.71  HGB 14.4 13.2 13.0  HCT 43.2 41.0 40.8  MCV 84.0 85.2 86.6  MCH 28.0 27.4 27.6  MCHC 33.3 32.2 31.9  RDW 13.8 14.0 14.1  PLT 303 253 277    Cardiac Enzymes  Recent Labs Lab 12/13/16 1308 12/13/16 1643 12/13/16 2213 12/14/16 0441  TROPONINI 0.86* 0.80* 0.69* 0.57*   No results for input(s): TROPIPOC in the last 168 hours.   BNP  Recent Labs Lab 12/13/16 0652  BNP 461.5*     DDimer   Recent Labs Lab 12/13/16 0652  DDIMER 0.65*  Radiology    No results found.  Cardiac Studies   Echo 12/13/16 Study Conclusions  - Left ventricle: The cavity size was normal. Systolic function was   severely reduced. The estimated ejection fraction was in the   range of 15-20%. All basal segments and mid inferior walls are   hyperdynamic. All the other mid and apical segments are akinetic.   Features are consistent with a pseudonormal left ventricular   filling pattern, with concomitant abnormal relaxation and   increased filling pressure (grade 2 diastolic dysfunction).   Doppler parameters are consistent with elevated ventricular   end-diastolic filling pressure. - Aortic valve: There was mild regurgitation. - Mitral valve: There was mild regurgitation. - Left atrium: The atrium was normal in size. - Right ventricle: Systolic function was normal. - Right atrium: The atrium was normal in size. - Tricuspid valve: There was moderate regurgitation. - Pulmonic valve: There was moderate regurgitation. - Pulmonary arteries: Systolic pressure was severely increased. PA   peak pressure: 52 mm Hg (S). - Inferior  vena cava: The vessel was normal in size. - Pericardium, extracardiac: There was no pericardial effusion.  Impressions:  - All basal segments and mid inferior walls are hyperdynamic. All   the other mid and apical segments are akinetic. The apical and   mid portion of left ventricle is ballooned.   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   Severe pulmonary hypertension.  Cardiac cath Normal coronary arteries and fairly normal filling pressures including a PA pressure of 30 systolic. 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  Continued medical therapy will be recommended.  Patient Profile     DIEDRA DEBACA is a 71 y.o. female with a history of NSTEMI with Takotsubo CM (2014), nonobstructive ASCAD, HTN, dyslipidemia, and RBBB who was transferred from [facility] to [facility] today for evaluation of chest pain with elevated cardiac enzymes.   Assessment & Plan    1. NSTEMI:  0.90--> 0.71--> 0.46--> 0.91--> 0.86-->0.8-->0.69-->0.57. Stop IV Heparin gtt.   Chest pain free now.Cath with normal coronary arteries.  Presentation c/w Takotsubo CM.    2. Acute on chronic systolic CHF - BNP of 123456. No orthopnea or PND. Volume status ok. Normal right heart and LV filling pressures.  Continue Coreg.  Had some low BP readings yesterday but better today.  Will add Lisinopril 2.5mg  daily and titrate as BP allows.    3. Palpitations - Intermittent short bursts of nonsustained atrial tachycardia. Continue to monitor on telemetry. Continue BB.  4. HLD - 12/14/2016: Cholesterol 209; HDL 55; LDL Cholesterol 135; Triglycerides 96; VLDL 19  - Crestor 10mg  started this admission. Discontinued 2 years ago. Will need FLP and ALT in 6 weeks.    5. HTN - BP improved today.  She says at home she has had wide swings in her BP.  Follow closely. Continue low dose BB and add low dose Lisinopril 2.5mg  daily.   Hold of SBP less than 90.  Will  transfer to tele bed today.  Follow BP while titrating CHF meds.  Probable d/c home in am if BP stable.   Signed, Fransico Him, MD  12/15/2016, 11:49 AM

## 2016-12-16 ENCOUNTER — Other Ambulatory Visit: Payer: Self-pay | Admitting: Physician Assistant

## 2016-12-16 DIAGNOSIS — I420 Dilated cardiomyopathy: Secondary | ICD-10-CM

## 2016-12-16 LAB — CBC
HCT: 39.7 % (ref 36.0–46.0)
Hemoglobin: 12.6 g/dL (ref 12.0–15.0)
MCH: 27.5 pg (ref 26.0–34.0)
MCHC: 31.7 g/dL (ref 30.0–36.0)
MCV: 86.7 fL (ref 78.0–100.0)
Platelets: 266 10*3/uL (ref 150–400)
RBC: 4.58 MIL/uL (ref 3.87–5.11)
RDW: 14 % (ref 11.5–15.5)
WBC: 11.4 10*3/uL — ABNORMAL HIGH (ref 4.0–10.5)

## 2016-12-16 MED ORDER — ROSUVASTATIN CALCIUM 10 MG PO TABS: 10.0000 mg | ORAL_TABLET | ORAL | 3 refills | Status: DC

## 2016-12-16 MED ORDER — NITROGLYCERIN 0.4 MG SL SUBL: 0.4000 mg | SUBLINGUAL_TABLET | SUBLINGUAL | 12 refills | Status: DC | PRN

## 2016-12-16 MED ORDER — NITROGLYCERIN 0.4 MG SL SUBL: 0.4000 mg | SUBLINGUAL_TABLET | SUBLINGUAL | 12 refills | Status: AC | PRN

## 2016-12-16 MED ORDER — LISINOPRIL 2.5 MG PO TABS: 2.5000 mg | ORAL_TABLET | Freq: Every day | ORAL | 1 refills | Status: DC

## 2016-12-16 NOTE — Discharge Summary (Signed)
Discharge Summary    Patient ID: Valerie Nash,  MRN: [id number], DOB/AGE: 1946-01-05 71 y.o.  Admit date: 12/13/2016 Discharge date: 12/16/2016  Primary Care Provider: GATES,ROBERT NEVILL Primary Cardiologist: Dr. Radford Pax, T.   Discharge Diagnoses    Active Problems:   Takotsubo cardiomyopathy   Elevated troponin   Chest pain   DCM (dilated cardiomyopathy) (HCC)   Allergies Allergies  Allergen Reactions  . Ambien [Zolpidem Tartrate] Other (See Comments)    Nighttime overeating  . Pravastatin Other (See Comments)    Muscle aches   . Simvastatin Other (See Comments)    Memory impairment  . Topamax [Topiramate] Swelling    Tongue swelling     History of Present Illness     Valerie Nash a 71 y.o.femalewith a history of NSTEMI with Takotsubo CM (2014), nonobstructive ASCAD, HTN, dyslipidemia, and RBBB who was transferred from [facility] to [facility] today for evaluation of chest pain with elevated cardiac enzymes.   Hospital Course     Consultants: None  Cardiac cath done during this admission showed normal coronary arteries and Takotsubo CM. Her Echo revealed current EF 15-20%. She remained CP free post cath, feeling well without SOB. She had intermittent episodes of short bursts of nonsustained atrial tachycardia and was therefore continued on her bb. Benicar was discontinued, she does not feel like it has been controlling her BP at home, home measurements of systolic 638'V. She was started on Lisinopril 2.5 mg. BP soft during her admission but her Lisinopril can be titrated up as needed. She ambulated this afternoon with cardiac rehab and did not experience any dizziness, CP, weakness, or SOB. Crestor discontinued by [place], PA-C at discharge,  because patient does not want to take it. Will repeat ALT and FLP in 6 weeks.   Dr. Radford Pax has seen patient and feels that she is recovering well and safe for discharge home. The right femoral catheter site is stable.  All follow-up  appointments have been scheduled. Discharge medications are listed below.  _____________  Discharge Vitals Blood pressure (!) 92/53, pulse 66, temperature 97.8 F (36.6 C), temperature source Oral, resp. rate 20, height 5\' 3"  (1.6 m), weight 165 lb 6.4 oz (75 kg), SpO2 95 %.  Filed Weights   12/15/16 0334 12/15/16 2319 12/16/16 0520  Weight: 174 lb 11.2 oz (79.2 kg) 167 lb 6.4 oz (75.9 kg) 165 lb 6.4 oz (75 kg)    Labs & Radiologic Studies     CBC  Recent Labs  12/15/16 0244 12/16/16 0510  WBC 11.1* 11.4*  HGB 13.0 12.6  HCT 40.8 39.7  MCV 86.6 86.7  PLT 277 564   Basic Metabolic Panel  Recent Labs  12/13/16 1643 12/14/16 0441 12/15/16 0244  NA  --  139 139  K  --  3.3* 4.5  CL  --  109 108  CO2  --  25 26  GLUCOSE  --  120* 97  BUN  --  14 17  CREATININE  --  0.81 0.82  CALCIUM  --  8.7* 8.8*  MG 2.1  --   --    Cardiac Enzymes  Recent Labs  12/13/16 1643 12/13/16 2213 12/14/16 0441  TROPONINI 0.80* 0.69* 0.57*   Hemoglobin A1C  Recent Labs  12/13/16 1643  HGBA1C 5.8*   Fasting Lipid Panel  Recent Labs  12/14/16 0441  CHOL 209*  HDL 55  LDLCALC 135*  TRIG 96  CHOLHDL 3.8    Dg Chest 2 View  Result Date: 12/13/2016 IMPRESSION: No evidence for acute cardiopulmonary abnormality. Electronically Signed   By: Nolon Nations M.D.   On: 12/13/2016 07:37   Ct Angio Chest Pe W And/or Wo Contrast Result Date: 12/13/2016  IMPRESSION: No definite evidence of pulmonary embolus. No acute abnormality seen in the chest. Electronically Signed   By: Marijo Conception, M.D.   On: 12/13/2016 08:41     Diagnostic Studies/Procedures   Echo 12/13/16 Study Conclusions  - Left ventricle: The cavity size was normal. Systolic function was severely reduced. The estimated ejection fraction was in the range of 15-20%. All basal segments and mid inferior walls are hyperdynamic. All the other mid and apical segments are akinetic. Features are consistent  with a pseudonormal left ventricular filling pattern, with concomitant abnormal relaxation and increased filling pressure (grade 2 diastolic dysfunction). Doppler parameters are consistent with elevated ventricular end-diastolic filling pressure. - Aortic valve: There was mild regurgitation. - Mitral valve: There was mild regurgitation. - Left atrium: The atrium was normal in size. - Right ventricle: Systolic function was normal. - Right atrium: The atrium was normal in size. - Tricuspid valve: There was moderate regurgitation. - Pulmonic valve: There was moderate regurgitation. - Pulmonary arteries: Systolic pressure was severely increased. PA peak pressure: 52 mm Hg (S). - Inferior vena cava: The vessel was normal in size. - Pericardium, extracardiac: There was no pericardial effusion.  Impressions:  - All basal segments and mid inferior walls are hyperdynamic. All the other mid and apical segments are akinetic. The apical and mid portion of left ventricle is ballooned. 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 Severe pulmonary hypertension.  Cardiac cath Normal coronary arteries and fairly normal filling pressures including a PA pressure of 30 systolic. 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  Continued medical therapy will be recommended. _____________    Disposition   Pt is being discharged home today in good condition.  Follow-up Plans & Appointments    Follow-up Information    Truitt Merle, NP. Go on 12/21/2016.   Specialties:  Nurse Practitioner, Interventional Cardiology, Cardiology, Radiology Why:  appointment is at 11:00am, please arrive 15 minutes early.  Contact information: [place]. [address] [place] [address] [phone]          Discharge Instructions    AMB Referral to Cardiac Rehabilitation - Phase II    Complete by:  As  directed    Diagnosis:  NSTEMI   Diet - low sodium heart healthy    Complete by:  As directed    Discharge instructions    Complete by:  As directed    No driving for 5 days. No lifting over 5 lbs for 1 week. No sexual activity for 1 week.  Keep procedure site clean & dry. If you notice increased pain, swelling, bleeding or pus, call/return!  You may shower, but no soaking baths/hot tubs/pools for 1 week.   Increase activity slowly    Complete by:  As directed    May shower / Bathe    Complete by:  As directed       Discharge Medications   Allergies as of 12/16/2016      Reactions   Ambien [zolpidem Tartrate] Other (See Comments)   Nighttime overeating   Pravastatin Other (See Comments)   Muscle aches    Simvastatin Other (See Comments)   Memory impairment   Topamax [topiramate] Swelling   Tongue swelling  Medication List    STOP taking these medications   olmesartan 20 MG tablet Commonly known as:  BENICAR   rosuvastatin 10 MG tablet Commonly known as:  CRESTOR   SOOTHE OP     TAKE these medications   acetaminophen 500 MG tablet Commonly known as:  TYLENOL Take 1,000 mg by mouth daily as needed for headache (pain).   carvedilol 3.125 MG tablet Commonly known as:  COREG TAKE 1 TABLET BY MOUTH TWICE DAILY WITH MEALS   CoQ10 200 MG Caps Take 200 mg by mouth at bedtime.   dicyclomine 10 MG capsule Commonly known as:  BENTYL Take 1 capsule (10 mg total) by mouth 3 (three) times daily before meals. What changed:  when to take this  reasons to take this   ezetimibe 10 MG tablet Commonly known as:  ZETIA Take 1 tablet (10 mg total) by mouth daily. What changed:  when to take this   fish oil-omega-3 fatty acids 1000 MG capsule Take 1,000 mg by mouth at bedtime.   lisinopril 2.5 MG tablet Commonly known as:  PRINIVIL,ZESTRIL Take 1 tablet (2.5 mg total) by mouth daily. Start taking on:  12/17/2016   multivitamin with minerals Tabs tablet Take 1  tablet by mouth daily.   nitroGLYCERIN 0.4 MG SL tablet Commonly known as:  NITROSTAT Place 1 tablet (0.4 mg total) under the tongue every 5 (five) minutes x 3 doses as needed for chest pain.   OVER THE COUNTER MEDICATION Take 1 tablet by mouth daily. Biotin with collagen   OVER THE COUNTER MEDICATION Take 1 tablet by mouth at bedtime as needed (sleep). REM - sleep aid from [employer] (melatonin, valerian root, magnesium, etc.)   venlafaxine XR 75 MG 24 hr capsule Commonly known as:  EFFEXOR-XR Take 75 mg by mouth daily.   vitamin B-12 1000 MCG tablet Commonly known as:  CYANOCOBALAMIN Take 1,000 mcg by mouth daily.        Outstanding Labs/Studies   FLP and ALT 6 weeks Repeat Echo in 2 months  Duration of Discharge Encounter  Greater than 30 minutes including physician time.  Signed, Linus Mako PA-C 12/16/2016, 2:26 PM

## 2016-12-16 NOTE — Progress Notes (Signed)
Progress Note  Patient Name: Valerie Nash Date of Encounter: 12/16/2016  Primary Cardiologist: Dr. Radford Pax  Subjective   Pt feeling well today. No SOB, CP, coughing. Pt feeling well and wants to go home. Reports high BP at home, doesn't feel well controlled on the generic Cozaar which has been changed to 2.5 mg Lisinopril this admit. She is happy with this medication. Advised patient that if BP's start running high while she is at home, Lisinopril can be titrated up slowly IF directed to do so by Dr. Radford Pax.  Inpatient Medications    Scheduled Meds: . aspirin  81 mg Oral Daily  . carvedilol  3.125 mg Oral BID WC  . lisinopril  2.5 mg Oral Daily  . omega-3 acid ethyl esters  1,000 mg Oral Daily  . rosuvastatin  10 mg Oral Weekly  . sodium chloride flush  3 mL Intravenous Q12H  . sodium chloride flush  3 mL Intravenous Q12H  . venlafaxine XR  75 mg Oral q1800   Continuous Infusions:  PRN Meds: sodium chloride, sodium chloride, acetaminophen, dicyclomine, morphine injection, nitroGLYCERIN, ondansetron (ZOFRAN) IV, sodium chloride flush, sodium chloride flush   Vital Signs    Vitals:   12/15/16 2000 12/15/16 2319 12/16/16 0229 12/16/16 0520  BP: 107/69 117/68 (!) 100/53 110/65  Pulse: 81 77 72 81  Resp: (!) 24 18 20 18   Temp:  98.3 F (36.8 C) 97.9 F (36.6 C) 98.2 F (36.8 C)  TempSrc:  Oral Oral Oral  SpO2: 95% 93% 93% 95%  Weight:  167 lb 6.4 oz (75.9 kg)  165 lb 6.4 oz (75 kg)  Height:  5\' 3"  (1.6 m)      Intake/Output Summary (Last 24 hours) at 12/16/16 0853 Last data filed at 12/16/16 0523  Gross per 24 hour  Intake                0 ml  Output             1200 ml  Net            -1200 ml   Filed Weights   12/15/16 0334 12/15/16 2319 12/16/16 0520  Weight: 174 lb 11.2 oz (79.2 kg) 167 lb 6.4 oz (75.9 kg) 165 lb 6.4 oz (75 kg)    Telemetry   NSR, HR 70, occasional PVC's- Personally Reviewed  ECG    None since 3/5 - Personally Reviewed  Physical Exam     GEN: No acute distress.   Neck: No JVD Cardiac: RRR, no murmurs, rubs, or gallops.  Respiratory: Clear to auscultation bilaterally. GI: Soft, nontender, non-distended  MS: No edema; No deformity. Neuro:  Nonfocal  Psych: Normal affect   Labs    Chemistry Recent Labs Lab 12/13/16 0652 12/14/16 0441 12/15/16 0244  NA 141 139 139  K 3.6 3.3* 4.5  CL 110 109 108  CO2 22 25 26   GLUCOSE 153* 120* 97  BUN 15 14 17   CREATININE 0.78 0.81 0.82  CALCIUM 9.0 8.7* 8.8*  GFRNONAA >60 >60 >60  GFRAA >60 >60 >60  ANIONGAP 9 5 5      Hematology Recent Labs Lab 12/14/16 0441 12/15/16 0244 12/16/16 0510  WBC 13.9* 11.1* 11.4*  RBC 4.81 4.71 4.58  HGB 13.2 13.0 12.6  HCT 41.0 40.8 39.7  MCV 85.2 86.6 86.7  MCH 27.4 27.6 27.5  MCHC 32.2 31.9 31.7  RDW 14.0 14.1 14.0  PLT 253 277 266    Cardiac Enzymes Recent Labs  Lab 12/13/16 1308 12/13/16 1643 12/13/16 2213 12/14/16 0441  TROPONINI 0.86* 0.80* 0.69* 0.57*   No results for input(s): TROPIPOC in the last 168 hours.   BNP Recent Labs Lab 12/13/16 0652  BNP 461.5*     DDimer  Recent Labs Lab 12/13/16 0652  DDIMER 0.65*     Radiology    CT Angio Chest 12/13/16 IMPRESSION: No definite evidence of pulmonary embolus. No acute abnormality seen in the chest  Chest xray  12/13/16 IMPRESSION: No evidence for acute cardiopulmonary abnormality.   Cardiac Studies   Echo 12/13/16 Study Conclusions  - Left ventricle: The cavity size was normal. Systolic function was severely reduced. The estimated ejection fraction was in the range of 15-20%. All basal segments and mid inferior walls are hyperdynamic. All the other mid and apical segments are akinetic. Features are consistent with a pseudonormal left ventricular filling pattern, with concomitant abnormal relaxation and increased filling pressure (grade 2 diastolic dysfunction). Doppler parameters are consistent with elevated  ventricular end-diastolic filling pressure. - Aortic valve: There was mild regurgitation. - Mitral valve: There was mild regurgitation. - Left atrium: The atrium was normal in size. - Right ventricle: Systolic function was normal. - Right atrium: The atrium was normal in size. - Tricuspid valve: There was moderate regurgitation. - Pulmonic valve: There was moderate regurgitation. - Pulmonary arteries: Systolic pressure was severely increased. PA peak pressure: 52 mm Hg (S). - Inferior vena cava: The vessel was normal in size. - Pericardium, extracardiac: There was no pericardial effusion.  Impressions:  - All basal segments and mid inferior walls are hyperdynamic. All the other mid and apical segments are akinetic. The apical and mid portion of left ventricle is ballooned. 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 Severe pulmonary hypertension.  Cardiac cath Normal coronary arteries and fairly normal filling pressures including a PA pressure of 30 systolic. 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  Continued medical therapy will be recommended.  Patient Profile     Valerie Nash a 71 y.o.femalewith a history of NSTEMI with Takotsubo CM (2014), nonobstructive ASCAD, HTN, dyslipidemia, and RBBB who was transferred from [facility] to [facility] today for evaluation of chest pain with elevated cardiac enzymes.   Assessment & Plan    1. NSTEMI:  0.90--> 0.71--> 0.46--> 0.91--> 0.86-->0.8-->0.69-->0.57.  Continues to be chest pain free now. Cath with normal coronary arteries.  Presentation c/w Takotsubo CM.    2. Acute on chronic systolic CHF - BNP of 161. No orthopnea or PND. Weight stable. Normal right heart and LV filling pressures.  Continue Coreg. Blood pressures continue to be soft 98/53 and 119/91 - Lisinopril 2.5mg  added yesterday, continue.  3. Palpitations  - No Intermittent  short bursts of nonsustained atrial tachycardia noted overnight.  - Continue BB.  4. HLD - 12/14/2016: Cholesterol 209; HDL 55; LDL Cholesterol 135; Triglycerides 96; VLDL 19  - Crestor 10mg  started this admission. Discontinued 2 years ago. Will need FLP and ALT in 6 weeks.    5. HTN - BP improved from yesterday but still remains soft, see above. Continue low dose BB and Lisinopril 2.5mg  daily.    Anticipate d/c home today as BP has been stable and patient is feeling well.  Kristopher Glee, PA-C  12/16/2016, 8:53 AM

## 2016-12-16 NOTE — Progress Notes (Signed)
Patient transferred to [place]. Patient's  VSS. No complaints of pain. Patient rested well overnight. In bed resting. No complaints at this time.

## 2016-12-16 NOTE — Discharge Instructions (Signed)
No driving for 5 days. No lifting over 5 lbs for 1 week. No sexual activity for 1 week.  Keep procedure site clean & dry. If you notice increased pain, swelling, bleeding or pus, call/return!  You may shower, but no soaking baths/hot tubs/pools for 1 week.  ° ° °

## 2016-12-16 NOTE — Progress Notes (Signed)
Pt sitting in bed not needs at this time. Daughter at the bedside. Waiting for provider to see patient.

## 2016-12-16 NOTE — Progress Notes (Signed)
Pt had some questions regarding crestor and needed prescriptions sent to different pharmacy. MD made aware. Crestor discontinued and pharmacy changed. Discharge instructions given, questions answered. IV and tele removed. Taken out via wheelchair. Rt groin level 0.

## 2016-12-16 NOTE — Progress Notes (Signed)
Heart Failure Navigator Consult Note  Presentation: Valerie Nash is a 71 y.o. female with a history of NSTEMI with Takotsubo CM (2014), nonobstructive ASCAD, HTN, dyslipidemia, and RBBB who was transferred from [facility] to [facility] today for evaluation of chest pain with elevated cardiac enzymes.   She had NSTEMI with Takotsubo CM in 12/2012 with EF 35-40% with severe hypokinesis. EF had completely normalized by echo in 02/2016.   She was last seen by Dr. Radford Pax in 06/2014 but has been lost to follow up since that time. She was in her usual state of health until last night around 8pm when she started to have left sided chest discomfort. Patient reports she felt "off" the whole evening and was unable to sleep. She decided to come here this morning. Upon walking patient became increasingly short of breath and the chest pain got worse. She says this feels exactly like when she had  Takotsubo CM back in 12/2012.  On arrival the patient was hypoxic and tachycardic after walking from her car to the triage area. D dimer was positive but CT angio was negative for PE. Her troponin noted to be positive and she was started on IV heparin and transferred to [facility] for further work up and treatment.    Past Medical History:  Diagnosis Date  . Anxiety   . 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 CAD (coronary artery disease)    nonobstructive AS-30% mid LAD otherwise normal  . Cancer ([place])   . Cardiomyopathy ([place])   . Colon polyps 2004   4 adenomatous polyps  . Depression   . Dyslipidemia   . Hypertension   . IBS (irritable bowel syndrome)   . Insomnia   . NSTEMI (non-ST elevated myocardial infarction) ([place])   . Obesity (BMI 30-39.9)   . RBBB (right bundle branch block with left anterior fascicular block)   . Sinusitis   . Takotsubo cardiomyopathy   . Takotsubo cardiomyopathy 12/2012   EF 35%    Social History   Social History  . Marital status: Widowed    Spouse name: N/A  . Number of children: 2  . 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 Smoking status: Former Smoker    Packs/day: 0.00    Years: 29.00    Types: Cigarettes    Quit date: 02/17/1995  . Smokeless tobacco: Never Used  . Alcohol use Yes     Comment: socially  . Drug use: No  . Sexual activity: Not Asked   Other Topics Concern  . None   Social History Narrative  . None    ECHO:Study Conclusions--12/13/16 - Left ventricle: The cavity size was normal. Systolic function was   severely reduced. The estimated ejection fraction was in the   range of 15-20%. All basal segments and mid inferior walls are   hyperdynamic. All the other mid and apical segments are akinetic.   Features are consistent with a pseudonormal left ventricular   filling pattern, with concomitant abnormal relaxation and   increased filling pressure (grade 2 diastolic dysfunction).   Doppler parameters are consistent with elevated ventricular   end-diastolic filling pressure. - Aortic valve: There was mild regurgitation. - Mitral valve: There was mild regurgitation. - Left atrium: The atrium was normal in size. - Right ventricle: Systolic function was normal. - Right atrium: The atrium was normal in size. -  Tricuspid valve: There was moderate regurgitation. - Pulmonic valve: There was moderate regurgitation. - Pulmonary arteries: Systolic pressure was severely increased. PA   peak pressure: 52 mm Hg (S). - Inferior vena cava: The vessel was normal in size. - Pericardium, extracardiac: There was no pericardial effusion.  Impressions:  - All basal segments and mid inferior walls are hyperdynamic. All   the other mid and apical segments are akinetic. The apical and   mid portion of left ventricle is ballooned.   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   Severe pulmonary hypertension.  Cardiac cath--12/14/16 Ms  Ribera has normal coronary arteries and fairly normal filling pressures including a PA pressure of 30 systolic. 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 I performed a right femoral angiogram and then deployed a [facility] closure device successfully achieving hemostasis. The patient left the lab in stable condition. Continued medical therapy will be recommended.  BNP    Component Value Date/Time   BNP 461.5 (H) 12/13/2016 2585    ProBNP No results found for: PROBNP   Education Assessment and Provision:  Detailed education and instructions provided on heart failure disease management including the following:  Signs and symptoms of Heart Failure When to call the physician Importance of daily weights Low sodium diet Fluid restriction Medication management Anticipated future follow-up appointments  Patient education given on each of the above topics.  Patient acknowledges understanding and acceptance of all instructions.  I spoke with Ms. Jech regarding her diagnosis and current hospitalization.  I reviewed the importance of daily weights and when to contact the physician.  I also briefly reviewed a low sodium diet and high sodium foods to avoid.  She denies any issues with getting or taking prescribed medications.  She tells me that she sees Dr Radford Pax as an outpatient.  She also says that she "misunderstood" and had not followed up since her last cardiology visit due to she felt she had recovered and there was not a need.   Education Materials:  "Living Better With Heart Failure" Booklet, Daily Weight Tracker Tool    High Risk Criteria for Readmission and/or Poor Patient Outcomes:   EF <30%- yes 20-25%  2 or more admissions in 6 months-  No 1/6  Difficult social situation- denies  Demonstrates medication noncompliance- denies   Barriers of Care:  Knowledge and compliance  Discharge Planning:   Plans to return to home alone in [place].

## 2016-12-18 ENCOUNTER — Encounter: Payer: Self-pay | Admitting: Nurse Practitioner

## 2016-12-21 ENCOUNTER — Ambulatory Visit: Payer: Medicare Other | Admitting: Nurse Practitioner

## 2016-12-21 NOTE — Progress Notes (Deleted)
CARDIOLOGY OFFICE NOTE  Date:  12/21/2016    Valerie Nash Date of Birth: 1946-01-05 Medical Record #[id number]  PCP:  Henrine Screws, MD  Cardiologist:  Servando Snare & ***    No chief complaint on file.   History of Present Illness: Valerie Nash is a 71 y.o. female who presents today for a ***   Comes in today. Here with   Past Medical History:  Diagnosis Date  . Anxiety   . 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 CAD (coronary artery disease)    nonobstructive AS-30% mid LAD otherwise normal  . Cancer ([place])   . Cardiomyopathy ([place])   . Colon polyps 2004   4 adenomatous polyps  . Depression   . Dyslipidemia   . Hypertension   . IBS (irritable bowel syndrome)   . Insomnia   . NSTEMI (non-ST elevated myocardial infarction) ([place])   . Obesity (BMI 30-39.9)   . RBBB (right bundle branch block with left anterior fascicular block)   . Sinusitis   . Takotsubo cardiomyopathy   . 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 ABDOMINAL HYSTERECTOMY    . BREAST LUMPECTOMY    . CESAREAN SECTION    . LEFT HEART CATHETERIZATION WITH CORONARY ANGIOGRAM Bilateral 12/26/2012   Procedure: LEFT HEART CATHETERIZATION WITH CORONARY ANGIOGRAM;  Surgeon: Sueanne Margarita, MD;  Location: [facility] CATH [facility];  Service: Cardiovascular;  Laterality: Bilateral;  . RIGHT/LEFT HEART CATH AND CORONARY ANGIOGRAPHY N/A 12/14/2016   Procedure: Right/Left Heart Cath and Coronary Angiography;  Surgeon: Lorretta Harp, MD;  Location: [facility];  Service: Cardiovascular;  Laterality: N/A;  . SHOULDER SURGERY       Medications: Current Outpatient Prescriptions  Medication Sig Dispense Refill  . acetaminophen (TYLENOL) 500 MG tablet Take 1,000 mg by mouth daily as needed for headache (pain).    . carvedilol (COREG) 3.125 MG tablet TAKE 1 TABLET BY MOUTH TWICE DAILY WITH MEALS 180 tablet 0  . Coenzyme Q10 (COQ10) 200 MG CAPS Take 200 mg by  mouth at bedtime.    . dicyclomine (BENTYL) 10 MG capsule Take 1 capsule (10 mg total) by mouth 3 (three) times daily before meals. (Patient taking differently: Take 10 mg by mouth daily as needed (stomach pain). ) 90 capsule 5  . ezetimibe (ZETIA) 10 MG tablet Take 1 tablet (10 mg total) by mouth daily. (Patient taking differently: Take 10 mg by mouth at bedtime. ) 30 tablet 11  . fish oil-omega-3 fatty acids 1000 MG capsule Take 1,000 mg by mouth at bedtime.     Marland Kitchen lisinopril (PRINIVIL,ZESTRIL) 2.5 MG tablet Take 1 tablet (2.5 mg total) by mouth daily. 30 tablet 1  . Multiple Vitamin (MULTIVITAMIN WITH MINERALS) TABS Take 1 tablet by mouth daily.    . nitroGLYCERIN (NITROSTAT) 0.4 MG SL tablet Place 1 tablet (0.4 mg total) under the tongue every 5 (five) minutes x 3 doses as needed for chest pain. 25 tablet 12  . OVER THE COUNTER MEDICATION Take 1 tablet by mouth daily. Biotin with collagen    . OVER THE COUNTER MEDICATION Take 1 tablet by mouth at bedtime as needed (sleep). REM - sleep aid from [employer] (melatonin, valerian root, magnesium, etc.)    . venlafaxine XR (EFFEXOR-XR) 75 MG 24 hr capsule Take 75 mg by mouth daily.    . vitamin B-12 (CYANOCOBALAMIN) 1000 MCG tablet Take  1,000 mcg by mouth daily.     No current facility-administered medications for this visit.     Allergies: Allergies  Allergen Reactions  . Ambien [Zolpidem Tartrate] Other (See Comments)    Nighttime overeating  . Pravastatin Other (See Comments)    Muscle aches   . Simvastatin Other (See Comments)    Memory impairment  . Topamax [Topiramate] Swelling    Tongue swelling    Social History: The patient  reports that she quit smoking about 21 years ago. Her smoking use included Cigarettes. She smoked 0.00 packs per day for 29.00 years. She has never used smokeless tobacco. She reports that she drinks alcohol. She reports that she does not use drugs.   Family History: The patient's ***family history includes  Breast cancer (age of onset: 77) in her sister; Cervical cancer (age of onset: 41) in her daughter; Colon cancer (age of onset: 31) in her father; Colon polyps in her brother; Congestive Heart Failure in her maternal grandmother and mother; Lung cancer in her maternal grandfather and maternal uncle.   Review of Systems: Please see the history of present illness.   Otherwise, the review of systems is positive for {NONE DEFAULTED:18576::"none"}.   All other systems are reviewed and negative.   Physical Exam: VS:  There were no vitals taken for this visit. Marland Kitchen  BMI There is no height or weight on file to calculate BMI.  Wt Readings from Last 3 Encounters:  12/16/16 165 lb 6.4 oz (75 kg)  09/03/15 166 lb (75.3 kg)  07/24/15 166 lb (75.3 kg)    General: Pleasant. Well developed, well nourished and in no acute distress.   HEENT: Normal.  Neck: Supple, no JVD, carotid bruits, or masses noted.  Cardiac: ***Regular rate and rhythm. No murmurs, rubs, or gallops. No edema.  Respiratory:  Lungs are clear to auscultation bilaterally with normal work of breathing.  GI: Soft and nontender.  MS: No deformity or atrophy. Gait and ROM intact.  Skin: Warm and dry. Color is normal.  Neuro:  Strength and sensation are intact and no gross focal deficits noted.  Psych: Alert, appropriate and with normal affect.   LABORATORY DATA:  EKG:  EKG {ACTION; IS/IS JJO:[id number]} ordered today. This demonstrates ***.  Lab Results  Component Value Date   WBC 11.4 (H) 12/16/2016   HGB 12.6 12/16/2016   HCT 39.7 12/16/2016   PLT 266 12/16/2016   GLUCOSE 97 12/15/2016   CHOL 209 (H) 12/14/2016   TRIG 96 12/14/2016   HDL 55 12/14/2016   LDLDIRECT 143.3 08/28/2013   LDLCALC 135 (H) 12/14/2016   ALT 19 03/12/2014   AST 22 03/12/2014   NA 139 12/15/2016   K 4.5 12/15/2016   CL 108 12/15/2016   CREATININE 0.82 12/15/2016   BUN 17 12/15/2016   CO2 26 12/15/2016   TSH 2.850 12/24/2012   INR 1.07 12/14/2016    HGBA1C 5.8 (H) 12/13/2016    BNP (last 3 results)  Recent Labs  12/13/16 0652  BNP 461.5*    ProBNP (last 3 results) No results for input(s): PROBNP in the last 8760 hours.   Other Studies Reviewed Today:   Assessment/Plan:   Current medicines are reviewed with the patient today.  The patient does not have concerns regarding medicines other than what has been noted above.  The following changes have been made:  See above.  Labs/ tests ordered today include:   No orders of the defined types were placed in this encounter.  Disposition:   FU with *** in {gen number 3-79:024097} {Days to years:10300}.   Patient is agreeable to this plan and will call if any problems develop in the interim.   SignedTruitt Merle, NP  12/21/2016 7:39 AM  [facility] [address] [place], [place]  [address] Phone: [phone] Fax: [phone]

## 2016-12-23 ENCOUNTER — Telehealth (HOSPITAL_COMMUNITY): Payer: Self-pay | Admitting: Internal Medicine

## 2016-12-23 NOTE — Telephone Encounter (Signed)
Patient has Medicare A/B and [employer]. Patient insurance benefits [employer] verified: no co-payment, no deductible, no out of pocket max, no co-insurance, no pre-authorization and no limit on visit. Passport/reference [phone]. Information given to [facility] for review.

## 2016-12-31 ENCOUNTER — Ambulatory Visit (INDEPENDENT_AMBULATORY_CARE_PROVIDER_SITE_OTHER): Payer: Medicare Other | Admitting: Physician Assistant

## 2016-12-31 ENCOUNTER — Encounter: Payer: Self-pay | Admitting: Physician Assistant

## 2016-12-31 VITALS — BP 126/84 | HR 62 | Ht 63.0 in | Wt 167.0 lb

## 2016-12-31 DIAGNOSIS — I4719 Other supraventricular tachycardia: Secondary | ICD-10-CM | POA: Insufficient documentation

## 2016-12-31 DIAGNOSIS — I10 Essential (primary) hypertension: Secondary | ICD-10-CM

## 2016-12-31 DIAGNOSIS — E785 Hyperlipidemia, unspecified: Secondary | ICD-10-CM

## 2016-12-31 DIAGNOSIS — I5181 Takotsubo syndrome: Secondary | ICD-10-CM

## 2016-12-31 DIAGNOSIS — D72829 Elevated white blood cell count, unspecified: Secondary | ICD-10-CM | POA: Diagnosis not present

## 2016-12-31 DIAGNOSIS — I471 Supraventricular tachycardia: Secondary | ICD-10-CM | POA: Insufficient documentation

## 2016-12-31 MED ORDER — LOSARTAN POTASSIUM 25 MG PO TABS: 25.0000 mg | ORAL_TABLET | Freq: Every day | ORAL | 3 refills | Status: DC

## 2016-12-31 NOTE — Patient Instructions (Addendum)
Medication Instructions:  Your physician has recommended you make the following change in your medication: please stop taking the Lisinopril.  Please start taking Losartan 25 mg daily.    Labwork: Your physician recommends that you return for lab work today: TSH   Testing/Procedures: Your physician has requested that you have an echocardiogram after 01/12/2017. Echocardiography is a painless test that uses sound waves to create images of your heart. It provides your doctor with information about the size and shape of your heart and how well your heart's chambers and valves are working. This procedure takes approximately one hour. There are no restrictions for this procedure.    Follow-Up: Your physician recommends that you schedule a follow-up appointment in: with Dr. Radford Pax in 3 months.   Any Other Special Instructions Will Be Listed Below (If Applicable).  Follow up with PCP to recheck white blood cell count. if your blood pressure remains 135 or higher on top regularly call and give readings to [facility]. [phone]   If you need a refill on your cardiac medications before your next appointment, please call your pharmacy.

## 2016-12-31 NOTE — Progress Notes (Signed)
Cardiology Office Note    Date:  12/31/2016  ID:  Valerie Nash, DOB 1945-12-16, MRN [id number] PCP:  Henrine Screws, MD  Cardiologist:  Dr. Radford Pax   Chief Complaint: f/u recent admission for cardiomyopathy  History of Present Illness:  Valerie Nash is a 71 y.o. 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 Was recently admitted 12/2016 with chest pain/elevated enzymes with 2D Echo showing EF 15-20% (previously recovered to 57% in 2014), also showed grade 2 DD, mild AI/MR, mod TR/PR, PASP 39mmHg (severe pulm HTN). She underwent LHC 12/14/16 showing normal coronary arteries and fairly normal filling pressures including a PA pressure of 30 systolic, and findings c/w pulmonary HTN per report. Her presentation was felt again due to Takotsubo CM. She also had intermittent episodes of short bursts of nonsustained atrial tachycardia and was therefore continued on her BB. She was started on low dose lisinopril, limited by soft blood pressure. The patient refused statin. Labs showed troponin 0.91, WBC 11-16, K 4.5, Cr 0.82, Hgb 13, LDL 135, A1C 5.8, BNP 461, d-dimer 0.65. CTA was negative for PE.  She presents back to clinic in follow-up feeling great. No recurrent chest discomfort. No LEE, orthopnea, palpitations, syncope. She noticed her BP was in the low 100s shortly after discharge, but in the last few days has been running 597C-163A systolic. She has been following it diligently because over the last year she'd noticed labile BPs with readings in the max 160s at times. She remains very active and plans to participate in the cardiac rehab program. She has noticed a dry cough with lisinopril. Her son is a PA.    Past Medical History:  Diagnosis Date  . Anxiety   . 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 Cancer ([place])   . Colon polyps 2004   4 adenomatous polyps  . Depression   .  Dyslipidemia   . Hypertension   . IBS (irritable bowel syndrome)   . Insomnia   . NSTEMI (non-ST elevated myocardial infarction) ([place])   . Obesity (BMI 30-39.9)   . PAT (paroxysmal atrial tachycardia) ([place])    a. first noted on tele 12/2016 during admission for Takotsubo.  . Pulmonary hypertension    a. Dx 12/2016 in setting of Takotsubo cardiomyopathy, EF 10-15%.  . RBBB (right bundle branch block with left anterior fascicular block)   . Sinusitis   . Takotsubo cardiomyopathy    a. in 2014 -> EF improved shortly after to 57%. b. recurrent Takotsubo in 12/2016 with EF down to 10-15%, normal coronaries.    Past Surgical History:  Procedure Laterality Date  . ABDOMINAL HYSTERECTOMY    . BREAST LUMPECTOMY    . CESAREAN SECTION    . LEFT HEART CATHETERIZATION WITH CORONARY ANGIOGRAM Bilateral 12/26/2012   Procedure: LEFT HEART CATHETERIZATION WITH CORONARY ANGIOGRAM;  Surgeon: Sueanne Margarita, MD;  Location: [facility];  Service: Cardiovascular;  Laterality: Bilateral;  . RIGHT/LEFT HEART CATH AND CORONARY ANGIOGRAPHY N/A 12/14/2016   Procedure: Right/Left Heart Cath and Coronary Angiography;  Surgeon: Lorretta Harp, MD;  Location: [facility];  Service: Cardiovascular;  Laterality: N/A;  . SHOULDER SURGERY      Current Medications: Current Outpatient Prescriptions  Medication Sig Dispense Refill  . acetaminophen (TYLENOL) 500 MG tablet Take 1,000 mg by mouth daily as needed for headache (pain).    . carvedilol (COREG) 3.125 MG tablet TAKE  1 TABLET BY MOUTH TWICE DAILY WITH MEALS 180 tablet 0  . Coenzyme Q10 (COQ10) 200 MG CAPS Take 200 mg by mouth at bedtime.    . dicyclomine (BENTYL) 10 MG capsule Take 10 mg by mouth daily as needed for spasms.    Marland Kitchen ezetimibe (ZETIA) 10 MG tablet Take 1 tablet (10 mg total) by mouth daily. (Patient taking differently: Take 10 mg by mouth at bedtime. ) 30 tablet 11  . fish oil-omega-3 fatty acids 1000 MG capsule Take 1,000 mg by mouth at bedtime.      Marland Kitchen lisinopril (PRINIVIL,ZESTRIL) 2.5 MG tablet Take 1 tablet (2.5 mg total) by mouth daily. 30 tablet 1  . Multiple Vitamin (MULTIVITAMIN WITH MINERALS) TABS Take 1 tablet by mouth daily.    . nitroGLYCERIN (NITROSTAT) 0.4 MG SL tablet Place 1 tablet (0.4 mg total) under the tongue every 5 (five) minutes x 3 doses as needed for chest pain. 25 tablet 12  . OVER THE COUNTER MEDICATION Take 1 tablet by mouth daily. Biotin with collagen    . OVER THE COUNTER MEDICATION Take 1 tablet by mouth at bedtime as needed (sleep). REM - sleep aid from [facility] (melatonin, valerian root, magnesium, etc.)    . venlafaxine XR (EFFEXOR-XR) 75 MG 24 hr capsule Take 75 mg by mouth daily.    . vitamin B-12 (CYANOCOBALAMIN) 1000 MCG tablet Take 1,000 mcg by mouth daily.     No current facility-administered medications for this visit.      Allergies:   Ambien [zolpidem tartrate]; Pravastatin; Simvastatin; and Topamax [topiramate]   Social History   Social History  . Marital status: Widowed    Spouse name: N/A  . Number of children: 2  . 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 Smoking status: Former Smoker    Packs/day: 0.00    Years: 29.00    Types: Cigarettes    Quit date: 02/17/1995  . Smokeless tobacco: Never Used  . Alcohol use Yes     Comment: socially  . Drug use: No  . Sexual activity: Not Asked   Other Topics Concern  . None   Social History Narrative  . None     Family History:  Family History  Problem Relation Age of Onset  . Congestive Heart Failure Mother   . Colon cancer Father 60  . Breast cancer Sister 51  . Colon polyps Brother     2 total  . Lung cancer Maternal Uncle     3ppd smoker  . Congestive Heart Failure Maternal Grandmother   . Lung cancer Maternal Grandfather   . Cervical cancer Daughter 37    ROS:   Please see the history of present illness.  All other systems are reviewed and  otherwise negative.    PHYSICAL EXAM:   VS:  BP 126/84   Pulse 62   Ht 5\' 3"  (1.6 m)   Wt 167 lb (75.8 kg)   SpO2 98%   BMI 29.58 kg/m   BMI: Body mass index is 29.58 kg/m. GEN: Well nourished, well developed WF, in no acute distress  HEENT: normocephalic, atraumatic Neck: no JVD, carotid bruits, or masses Cardiac: RRR; no murmurs, rubs, or gallops, no edema  Respiratory:  clear to auscultation bilaterally, normal work of breathing GI: soft, nontender, nondistended, + BS MS: no deformity or atrophy  Skin: warm and dry, no rash  Neuro:  Alert and Oriented x 3, Strength and sensation are intact, follows commands Psych: euthymic mood, full affect  Wt Readings from Last 3 Encounters:  12/31/16 167 lb (75.8 kg)  12/16/16 165 lb 6.4 oz (75 kg)  09/03/15 166 lb (75.3 kg)      Studies/Labs Reviewed:   EKG:  EKG was not ordered today.  Recent Labs: 12/13/2016: B Natriuretic Peptide 461.5; Magnesium 2.1 12/15/2016: BUN 17; Creatinine, Ser 0.82; Potassium 4.5; Sodium 139 12/16/2016: Hemoglobin 12.6; Platelets 266   Lipid Panel    Component Value Date/Time   CHOL 209 (H) 12/14/2016 0441   TRIG 96 12/14/2016 0441   HDL 55 12/14/2016 0441   CHOLHDL 3.8 12/14/2016 0441   VLDL 19 12/14/2016 0441   LDLCALC 135 (H) 12/14/2016 0441   LDLDIRECT 143.3 08/28/2013 1053    Additional studies/ records that were reviewed today include: Summarized above.    ASSESSMENT & PLAN:   1. Takotsubo cardiomyopathy - she had several questions today which I did my best to answer. She's noticed a dry cough with lisinopril. Will change to ARB. She was previously on benicar but states she does not think insurance will cover this anymore. Stop lisinopril and start losartan 25mg  daily. Based on her upward BP trend and feeling great, I suspect she's already seeing improvement in her LV function. Will recheck echo 4 weeks after initial event (after April 3rd). Continue carvedilol. No signs/sx of CHF on  exam. 2. Atrial tachycardia - check TSH for completeness. She's been asymptomatic. Continue beta blocker. 3. HTN - follow BP with change from ACEI->ARB. I asked the patient to follow BP and call if her SBP begins creeping up >135, at which time I would recommend to double the losartan. 4. Dyslipidemia - continue present regimen. Recently declined statin. 5. Leukocytosis - suspect reactive due to her acute cardiac presentation but I asked her to f/u PCP for a recheck to further trend.  Disposition: F/u with Dr. Radford Pax in 3 months.   Medication Adjustments/Labs and Tests Ordered: Current medicines are reviewed at length with the patient today.  Concerns regarding medicines are outlined above. Medication changes, Labs and Tests ordered today are summarized above and listed in the Patient Instructions accessible in Encounters.   Raechel Ache PA-C  12/31/2016 3:45 PM    [facility] [place], [place], [place]  [address] Phone: [phone]; Fax: [phone]

## 2017-01-01 LAB — TSH: TSH: 4.27 u[IU]/mL (ref 0.450–4.500)

## 2017-01-04 ENCOUNTER — Ambulatory Visit: Payer: Medicare Other | Admitting: Nurse Practitioner

## 2017-01-21 ENCOUNTER — Ambulatory Visit (HOSPITAL_COMMUNITY): Payer: Medicare Other | Attending: Internal Medicine

## 2017-01-21 ENCOUNTER — Other Ambulatory Visit: Payer: Self-pay

## 2017-01-21 DIAGNOSIS — I5181 Takotsubo syndrome: Secondary | ICD-10-CM | POA: Insufficient documentation

## 2017-01-22 ENCOUNTER — Telehealth: Payer: Self-pay | Admitting: Pharmacist

## 2017-01-22 ENCOUNTER — Telehealth: Payer: Self-pay | Admitting: Nurse Practitioner

## 2017-01-22 NOTE — Telephone Encounter (Signed)
Returned pts call and discussed her echo results. Pt verbalized understanding.

## 2017-01-22 NOTE — Telephone Encounter (Signed)
-----   Message from [place], [place] sent at 01/22/2017  3:32 PM EDT ----- Pt aware that someone from the BP clinic will call and set up a time for her to come in and for her to bring her bp cuff with her so they can make plans going forward. Pt advised that she has already cut her sodium down.

## 2017-01-22 NOTE — Telephone Encounter (Signed)
Talked to patient today. Initial appt with HNT clinic schedule for 4/24 at 2pm

## 2017-01-22 NOTE — Telephone Encounter (Signed)
New Message     Pt is returning [facility] call for echo test results   [phone] cell or home [phone]

## 2017-02-02 ENCOUNTER — Telehealth (HOSPITAL_COMMUNITY): Payer: Self-pay | Admitting: Internal Medicine

## 2017-02-02 ENCOUNTER — Ambulatory Visit (INDEPENDENT_AMBULATORY_CARE_PROVIDER_SITE_OTHER): Payer: Medicare Other | Admitting: Pharmacist

## 2017-02-02 VITALS — BP 132/86 | HR 70

## 2017-02-02 DIAGNOSIS — I10 Essential (primary) hypertension: Secondary | ICD-10-CM

## 2017-02-02 NOTE — Patient Instructions (Addendum)
Your blood pressure is excellent today - your goal is less than 140/90.  Continue to walk daily and start cardiac rehab.  Try to cut back on frozen microwave meals and cut back on the amount of caffeinated tea you drink each day.  Follow up with Dr Radford Pax in June as scheduled.

## 2017-02-02 NOTE — Progress Notes (Signed)
Patient ID: BRIANNAH Nash                 DOB: October 14, 1945                      MRN: [id number]     HPI: Valerie Nash is a 71 y.o. female patient of Dr Radford Pax referred by Melina Copa, PA to HTN clinic. PMH is significant for NSTEMI/Takotsubo cardiomyopathy, HTN, HLD, and RBBB. Recent echo 01/21/17 showed recovery of LVEF from 15-20% up to 60-65%. Her lisinopril was switched to losartan 1 month ago due to dry cough with lisinopril. Valerie Nash reported that her systolic BP readings had decreased as low as 106 and that her systolic readings had increased to the 90s since this medication change. She was subsequently scheduled to see HTN clinic and advised to bring in her BP cuff today.   Valerie Nash brings in a record of home BP readings. She checks her BP twice a day, once in the morning and once in the evening. She rotates which arms she checks as well. Most morning readings range systolic 387-564P, diastolic 32R-51O. Evening systolic readings are mostly 120-130s with a few elevated to 841Y, diastolic around 90. She has had her home bicep BP cuff for 3 years. Home reading in clinic today: 129/86, clinic reading 132/86. Home cuff seems to be measuring accurately.  Current HTN meds: carvedilol 3.125mg  BID, losartan 25mg  daily Previously tried: lisinopril - cough BP goal: <140/49mmHg  Family History: Mother with CHF, father with colon cancer, sister with breast cancer.  Social History: Former smoker for 29 years, quit in 1996. Drinks alcohol socially, denies illicit drug use.  Diet: Does not cook with salt. Does like frozen microwave meals. Drinks a cup of coffee in the morning. Drinks 16 oz of tea each day sweetened with Stevia.   Exercise: Walks each day with her dog. Will start cardiac rehab.  Wt Readings from Last 3 Encounters:  12/31/16 167 lb (75.8 kg)  12/16/16 165 lb 6.4 oz (75 kg)  09/03/15 166 lb (75.3 kg)   BP Readings from Last 3 Encounters:  12/31/16 126/84  12/16/16 (!) 92/53  09/03/15 138/81    Pulse Readings from Last 3 Encounters:  12/31/16 62  12/16/16 66  09/03/15 81    Renal function: CrCl cannot be calculated (Patient's most recent lab result is older than the maximum 21 days allowed.).  Past Medical History:  Diagnosis Date  . Anxiety   . 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 Cancer ([facility])   . Colon polyps 2004   4 adenomatous polyps  . Depression   . Dyslipidemia   . Hypertension   . IBS (irritable bowel syndrome)   . Insomnia   . NSTEMI (non-ST elevated myocardial infarction) ([facility])   . Obesity (BMI 30-39.9)   . PAT (paroxysmal atrial tachycardia) ([place])    a. first noted on tele 12/2016 during admission for Takotsubo.  . Pulmonary hypertension    a. Dx 12/2016 in setting of Takotsubo cardiomyopathy, EF 10-15%.  . RBBB (right bundle branch block with left anterior fascicular block)   . Sinusitis   . Takotsubo cardiomyopathy    a. in 2014 -> EF improved shortly after to 57%. b. recurrent Takotsubo in 12/2016 with EF down to 10-15%, normal coronaries.    Current Outpatient Prescriptions on File Prior to Visit  Medication Sig Dispense Refill  . acetaminophen (TYLENOL) 500 MG tablet Take 1,000 mg by mouth daily  as needed for headache (pain).    . carvedilol (COREG) 3.125 MG tablet TAKE 1 TABLET BY MOUTH TWICE DAILY WITH MEALS 180 tablet 0  . Coenzyme Q10 (COQ10) 200 MG CAPS Take 200 mg by mouth at bedtime.    . dicyclomine (BENTYL) 10 MG capsule Take 10 mg by mouth daily as needed for spasms.    Marland Kitchen ezetimibe (ZETIA) 10 MG tablet Take 1 tablet (10 mg total) by mouth daily. (Patient taking differently: Take 10 mg by mouth at bedtime. ) 30 tablet 11  . fish oil-omega-3 fatty acids 1000 MG capsule Take 1,000 mg by mouth at bedtime.     Marland Kitchen losartan (COZAAR) 25 MG tablet Take 1 tablet (25 mg total) by mouth daily. 90 tablet 3  . Multiple Vitamin (MULTIVITAMIN WITH MINERALS) TABS Take 1 tablet by mouth daily.    . nitroGLYCERIN (NITROSTAT) 0.4 MG SL tablet Place 1 tablet  (0.4 mg total) under the tongue every 5 (five) minutes x 3 doses as needed for chest pain. 25 tablet 12  . OVER THE COUNTER MEDICATION Take 1 tablet by mouth daily. Biotin with collagen    . OVER THE COUNTER MEDICATION Take 1 tablet by mouth at bedtime as needed (sleep). REM - sleep aid from [facility] (melatonin, valerian root, magnesium, etc.)    . venlafaxine XR (EFFEXOR-XR) 75 MG 24 hr capsule Take 75 mg by mouth daily.    . vitamin B-12 (CYANOCOBALAMIN) 1000 MCG tablet Take 1,000 mcg by mouth daily.     No current facility-administered medications on file prior to visit.     Allergies  Allergen Reactions  . Ambien [Zolpidem Tartrate] Other (See Comments)    Nighttime overeating  . Pravastatin Other (See Comments)    Muscle aches   . Simvastatin Other (See Comments)    Memory impairment  . Topamax [Topiramate] Swelling    Tongue swelling     Assessment/Plan:  1. Hypertension - BP is at goal <140/41mmHg with clinic reading and most home readings. Will continue carvedilol 3.125mg  BID and losartan 25mg  daily. Valerie Nash's home cuff is measuring accurately. She is a bit concerned that her diastolic readings are higher than they used to be - most range 80-low 90s. Advised Valerie Nash that medications do now lower diastolic BP effectively. Valerie Nash will work on lifestyle improvements to help lower her diastolic reading including starting cardiac rehab and cutting back on frozen microwave meals and caffeinated tea. Advised Valerie Nash to call clinic with any concerns, otherwise will follow up with Dr Radford Pax in June as scheduled.   Valerie Nash, PharmD, CPP, [facility] [address], [place], [place] [address] Phone: [phone]; Fax: [phone] 02/02/2017 2:26 PM

## 2017-02-02 NOTE — Telephone Encounter (Signed)
Update:Verified Medicare & [employer] Secondary insurance benefits through [employer]. Reference [phone] & [phone].Marland Kitchen... KJ

## 2017-02-04 ENCOUNTER — Encounter (HOSPITAL_COMMUNITY)
Admission: RE | Admit: 2017-02-04 | Discharge: 2017-02-04 | Disposition: A | Discharge status: Home / Self Care | Payer: Medicare Other | Source: Ambulatory Visit | Attending: Cardiology | Admitting: Cardiology

## 2017-02-04 ENCOUNTER — Encounter (HOSPITAL_COMMUNITY): Payer: Self-pay

## 2017-02-04 VITALS — BP 124/70 | HR 87 | Ht 64.0 in | Wt 168.2 lb

## 2017-02-04 DIAGNOSIS — I214 Non-ST elevation (NSTEMI) myocardial infarction: Secondary | ICD-10-CM | POA: Insufficient documentation

## 2017-02-04 NOTE — Progress Notes (Signed)
Cardiac Rehab Medication Review by a Pharmacist  Does the patient  feel that his/her medications are working for him/her?  Yes  Has the patient been experiencing any side effects to the medications prescribed?  No  Does the patient measure his/her own blood pressure or blood glucose at home? Yes  Does the patient have any problems obtaining medications due to transportation or finances?  No  Understanding of regimen: excellent Understanding of indications: excellent Potential of compliance: excellent    Pharmacist comments: 71 y/o F presents to cardiac rehab in good spirits and no distress. No issues found upon med rec. Pt has had difficulty sleeping. Counseled patient on sleep hygiene and OTC medications.    Angela Burke, PharmD, BCPS Pharmacy Resident Pager: [phone] 02/04/2017 8:12 AM

## 2017-02-04 NOTE — Progress Notes (Signed)
Cardiac Individual Treatment Plan  Patient Details  Name: MARENE GILLIAM MRN: [id number] Date of Birth: 09-07-1946 Referring Provider:     CARDIAC REHAB PHASE II ORIENTATION from 02/04/2017 in [place]  Referring Provider  Fransico Him MD      Initial Encounter Date:    CARDIAC REHAB PHASE II ORIENTATION from 02/04/2017 in [place]  Date  02/04/17  Referring Provider  Fransico Him MD      Visit Diagnosis: 12/13/16 NSTEMI (non-ST elevated myocardial infarction) [facility])  Patient's Home Medications on Admission:  Current Outpatient Prescriptions:  .  acetaminophen (TYLENOL) 500 MG tablet, Take 1,000 mg by mouth daily as needed for headache (pain)., Disp: , Rfl:  .  carvedilol (COREG) 3.125 MG tablet, TAKE 1 TABLET BY MOUTH TWICE DAILY WITH MEALS, Disp: 180 tablet, Rfl: 0 .  cholecalciferol (VITAMIN D) 1000 units tablet, Take 1,000 Units by mouth daily., Disp: , Rfl:  .  Coenzyme Q10 (COQ10) 200 MG CAPS, Take 200 mg by mouth at bedtime., Disp: , Rfl:  .  dicyclomine (BENTYL) 10 MG capsule, Take 10 mg by mouth daily as needed for spasms., Disp: , Rfl:  .  ezetimibe (ZETIA) 10 MG tablet, Take 1 tablet (10 mg total) by mouth daily. (Patient taking differently: Take 10 mg by mouth at bedtime. ), Disp: 30 tablet, Rfl: 11 .  fish oil-omega-3 fatty acids 1000 MG capsule, Take 1,000 mg by mouth at bedtime. , Disp: , Rfl:  .  losartan (COZAAR) 25 MG tablet, Take 1 tablet (25 mg total) by mouth daily., Disp: 90 tablet, Rfl: 3 .  Multiple Vitamin (MULTIVITAMIN WITH MINERALS) TABS, Take 1 tablet by mouth daily., Disp: , Rfl:  .  nitroGLYCERIN (NITROSTAT) 0.4 MG SL tablet, Place 1 tablet (0.4 mg total) under the tongue every 5 (five) minutes x 3 doses as needed for chest pain., Disp: 25 tablet, Rfl: 12 .  OVER THE COUNTER MEDICATION, Take 1 tablet by mouth daily. Biotin with collagen, Disp: , Rfl:  .  OVER THE COUNTER MEDICATION, Take 1  tablet by mouth at bedtime as needed (sleep). REM - sleep aid from [employer] (melatonin, valerian root, magnesium, etc.), Disp: , Rfl:  .  venlafaxine XR (EFFEXOR-XR) 75 MG 24 hr capsule, Take 75 mg by mouth daily., Disp: , Rfl:  .  vitamin B-12 (CYANOCOBALAMIN) 1000 MCG tablet, Take 1,000 mcg by mouth daily., Disp: , Rfl:   Past Medical History: Past Medical History:  Diagnosis Date  . Anxiety   . 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 Cancer ([place])   . Colon polyps 2004   4 adenomatous polyps  . Depression   . Dyslipidemia   . Hypertension   . IBS (irritable bowel syndrome)   . Insomnia   . NSTEMI (non-ST elevated myocardial infarction) ([place])   . Obesity (BMI 30-39.9)   . PAT (paroxysmal atrial tachycardia) ([place])    a. first noted on tele 12/2016 during admission for Takotsubo.  . Pulmonary hypertension ([place])    a. Dx 12/2016 in setting of Takotsubo cardiomyopathy, EF 10-15%.  . RBBB (right bundle branch block with left anterior fascicular block)   . Sinusitis   . Takotsubo cardiomyopathy    a. in 2014 -> EF improved shortly after to 57%. b. recurrent Takotsubo in 12/2016 with EF down to 10-15%, normal coronaries.    Tobacco Use: History  Smoking Status  . Former Smoker  . Packs/day: 0.00  .  Years: 29.00  . Types: Cigarettes  . Quit date: 02/17/1995  Smokeless Tobacco  . Never Used    Labs: Recent Review Flowsheet Data    Labs for ITP Cardiac and Pulmonary Rehab Latest Ref Rng & Units 03/12/2014 12/13/2016 12/14/2016 12/14/2016 12/14/2016   Cholestrol 0 - 200 mg/dL 166 - 209(H) - -   LDLCALC 0 - 99 mg/dL 90 - 135(H) - -   LDLDIRECT mg/dL - - - - -   HDL >40 mg/dL 59.20 - 55 - -   Trlycerides <150 mg/dL 86.0 - 96 - -   Hemoglobin A1c 4.8 - 5.6 % - 5.8(H) - - -   PHART 7.350 - 7.450 - - - 7.432 -   PCO2ART 32.0 - 48.0 mmHg - - - 35.7 -   HCO3 20.0 - 28.0 mmol/L - - - 23.8 24.9   TCO2 0 - 100 mmol/L - - - 25 26   O2SAT % - - - 94.0 69.0      Capillary Blood Glucose: No results  found for: GLUCAP   Exercise Target Goals: Date: 02/04/17  Exercise Program Goal: Individual exercise prescription set with THRR, safety & activity barriers. Participant demonstrates ability to understand and report RPE using BORG scale, to self-measure pulse accurately, and to acknowledge the importance of the exercise prescription.  Exercise Prescription Goal: Starting with aerobic activity 30 plus minutes a day, 3 days per week for initial exercise prescription. Provide home exercise prescription and guidelines that participant acknowledges understanding prior to discharge.  Activity Barriers & Risk Stratification:     Activity Barriers & Cardiac Risk Stratification - 02/04/17 0902      Activity Barriers & Cardiac Risk Stratification   Activity Barriers Deconditioning;Muscular Weakness   Cardiac Risk Stratification High      6 Minute Walk:     6 Minute Walk    Row Name 02/04/17 1406         6 Minute Walk   Phase Initial     Distance 1900 feet     Walk Time 6 minutes     # of Rest Breaks 0     MPH 3.6     METS 3.49     RPE 8     VO2 Peak 12.2     Symptoms No     Resting HR 87 bpm     Resting BP 124/70     Max Ex. HR 114 bpm     Max Ex. BP 124/84     2 Minute Post BP 121/80        Oxygen Initial Assessment:   Oxygen Re-Evaluation:   Oxygen Discharge (Final Oxygen Re-Evaluation):   Initial Exercise Prescription:     Initial Exercise Prescription - 02/04/17 1400      Date of Initial Exercise RX and Referring Provider   Date 02/04/17   Referring Provider Fransico Him MD     Treadmill   MPH 3   Grade 1   Minutes 10   METs 3.71     Bike   Level 0.7   Minutes 10   METs 2.76     NuStep   Level 3   SPM 80   Minutes 10   METs 2     Prescription Details   Frequency (times per week) 3   Duration Progress to 30 minutes of continuous aerobic without signs/symptoms of physical distress     Intensity   THRR 40-80% of Max Heartrate 60-120    Ratings  of Perceived Exertion 11-13   Perceived Dyspnea 0-4     Progression   Progression Continue to progress workloads to maintain intensity without signs/symptoms of physical distress.     Resistance Training   Training Prescription Yes   Weight 3lbs   Reps 10-15      Perform Capillary Blood Glucose checks as needed.  Exercise Prescription Changes:   Exercise Comments:   Exercise Goals and Review:     Exercise Goals    Row Name 02/04/17 0901             Exercise Goals   Increase Physical Activity Yes       Intervention Provide advice, education, support and counseling about physical activity/exercise needs.;Develop an individualized exercise prescription for aerobic and resistive training based on initial evaluation findings, risk stratification, comorbidities and participant's personal goals.       Expected Outcomes Achievement of increased cardiorespiratory fitness and enhanced flexibility, muscular endurance and strength shown through measurements of functional capacity and personal statement of participant.       Increase Strength and Stamina Yes       Intervention Provide advice, education, support and counseling about physical activity/exercise needs.;Develop an individualized exercise prescription for aerobic and resistive training based on initial evaluation findings, risk stratification, comorbidities and participant's personal goals.       Expected Outcomes Achievement of increased cardiorespiratory fitness and enhanced flexibility, muscular endurance and strength shown through measurements of functional capacity and personal statement of participant.          Exercise Goals Re-Evaluation :    Discharge Exercise Prescription (Final Exercise Prescription Changes):   Nutrition:  Target Goals: Understanding of nutrition guidelines, daily intake of sodium 1500mg , cholesterol 200mg , calories 30% from fat and 7% or less from saturated fats, daily to have 5 or  more servings of fruits and vegetables.  Biometrics:     Pre Biometrics - 02/04/17 1411      Pre Biometrics   Waist Circumference 36.5 inches   Hip Circumference 45 inches   Waist to Hip Ratio 0.81 %   Triceps Skinfold 36 mm   % Body Fat 41.6 %   Grip Strength 26 kg   Flexibility 14 in   Single Leg Stand 7.12 seconds       Nutrition Therapy Plan and Nutrition Goals:   Nutrition Discharge: Nutrition Scores:   Nutrition Goals Re-Evaluation:   Nutrition Goals Re-Evaluation:   Nutrition Goals Discharge (Final Nutrition Goals Re-Evaluation):   Psychosocial: Target Goals: Acknowledge presence or absence of significant depression and/or stress, maximize coping skills, provide positive support system. Participant is able to verbalize types and ability to use techniques and skills needed for reducing stress and depression.  Initial Review & Psychosocial Screening:     Initial Psych Review & Screening - 02/04/17 1556      Initial Review   Current issues with Current Anxiety/Panic     Family Dynamics   Good Support System? Yes     Barriers   Psychosocial barriers to participate in program There are no identifiable barriers or psychosocial needs.      Quality of Life Scores:     Quality of Life - 02/04/17 1426      Quality of Life Scores   Health/Function Pre 24.5 %   Socioeconomic Pre 28.33 %   Psych/Spiritual Pre 23.21 %   Family Pre 22.88 %   GLOBAL Pre 24.75 %      PHQ-9: Recent Review Flowsheet Data  There is no flowsheet data to display.     Interpretation of Total Score  Total Score Depression Severity:  1-4 = Minimal depression, 5-9 = Mild depression, 10-14 = Moderate depression, 15-19 = Moderately severe depression, 20-27 = Severe depression   Psychosocial Evaluation and Intervention:   Psychosocial Re-Evaluation:   Psychosocial Discharge (Final Psychosocial Re-Evaluation):   Vocational Rehabilitation: Provide vocational rehab  assistance to qualifying candidates.   Vocational Rehab Evaluation & Intervention:     Vocational Rehab - 02/04/17 1556      Initial Vocational Rehab Evaluation & Intervention   Assessment shows need for Vocational Rehabilitation No      Education: Education Goals: Education classes will be provided on a weekly basis, covering required topics. Participant will state understanding/return demonstration of topics presented.  Learning Barriers/Preferences:     Learning Barriers/Preferences - 02/04/17 0901      Learning Barriers/Preferences   Learning Barriers Sight  cataract surgery/ contacts   Learning Preferences Written Material;Pictoral      Education Topics: Count Your Pulse:  -Group instruction provided by verbal instruction, demonstration, patient participation and written materials to support subject.  Instructors address importance of being able to find your pulse and how to count your pulse when at home without a heart monitor.  Patients get hands on experience counting their pulse with staff help and individually.   Heart Attack, Angina, and Risk Factor Modification:  -Group instruction provided by verbal instruction, video, and written materials to support subject.  Instructors address signs and symptoms of angina and heart attacks.    Also discuss risk factors for heart disease and how to make changes to improve heart health risk factors.   Functional Fitness:  -Group instruction provided by verbal instruction, demonstration, patient participation, and written materials to support subject.  Instructors address safety measures for doing things around the house.  Discuss how to get up and down off the floor, how to pick things up properly, how to safely get out of a chair without assistance, and balance training.   Meditation and Mindfulness:  -Group instruction provided by verbal instruction, patient participation, and written materials to support subject.  Instructor  addresses importance of mindfulness and meditation practice to help reduce stress and improve awareness.  Instructor also leads participants through a meditation exercise.    Stretching for Flexibility and Mobility:  -Group instruction provided by verbal instruction, patient participation, and written materials to support subject.  Instructors lead participants through series of stretches that are designed to increase flexibility thus improving mobility.  These stretches are additional exercise for major muscle groups that are typically performed during regular warm up and cool down.   Hands Only CPR Anytime:  -Group instruction provided by verbal instruction, video, patient participation and written materials to support subject.  Instructors co-teach with AHA video for hands only CPR.  Participants get hands on experience with mannequins.   Nutrition I class: Heart Healthy Eating:  -Group instruction provided by PowerPoint slides, verbal discussion, and written materials to support subject matter. The instructor gives an explanation and review of the Therapeutic Lifestyle Changes diet recommendations, which includes a discussion on lipid goals, dietary fat, sodium, fiber, plant stanol/sterol esters, sugar, and the components of a well-balanced, healthy diet.   Nutrition II class: Lifestyle Skills:  -Group instruction provided by PowerPoint slides, verbal discussion, and written materials to support subject matter. The instructor gives an explanation and review of label reading, grocery shopping for heart health, heart healthy recipe modifications, and  ways to make healthier choices when eating out.   Diabetes Question & Answer:  -Group instruction provided by PowerPoint slides, verbal discussion, and written materials to support subject matter. The instructor gives an explanation and review of diabetes co-morbidities, pre- and post-prandial blood glucose goals, pre-exercise blood glucose goals,  signs, symptoms, and treatment of hypoglycemia and hyperglycemia, and foot care basics.   Diabetes Blitz:  -Group instruction provided by PowerPoint slides, verbal discussion, and written materials to support subject matter. The instructor gives an explanation and review of the physiology behind type 1 and type 2 diabetes, diabetes medications and rational behind using different medications, pre- and post-prandial blood glucose recommendations and Hemoglobin A1c goals, diabetes diet, and exercise including blood glucose guidelines for exercising safely.    Portion Distortion:  -Group instruction provided by PowerPoint slides, verbal discussion, written materials, and food models to support subject matter. The instructor gives an explanation of serving size versus portion size, changes in portions sizes over the last 20 years, and what consists of a serving from each food group.   Stress Management:  -Group instruction provided by verbal instruction, video, and written materials to support subject matter.  Instructors review role of stress in heart disease and how to cope with stress positively.     Exercising on Your Own:  -Group instruction provided by verbal instruction, power point, and written materials to support subject.  Instructors discuss benefits of exercise, components of exercise, frequency and intensity of exercise, and end points for exercise.  Also discuss use of nitroglycerin and activating EMS.  Review options of places to exercise outside of rehab.  Review guidelines for sex with heart disease.   Cardiac Drugs I:  -Group instruction provided by verbal instruction and written materials to support subject.  Instructor reviews cardiac drug classes: antiplatelets, anticoagulants, beta blockers, and statins.  Instructor discusses reasons, side effects, and lifestyle considerations for each drug class.   Cardiac Drugs II:  -Group instruction provided by verbal instruction and  written materials to support subject.  Instructor reviews cardiac drug classes: angiotensin converting enzyme inhibitors (ACE-I), angiotensin II receptor blockers (ARBs), nitrates, and calcium channel blockers.  Instructor discusses reasons, side effects, and lifestyle considerations for each drug class.   Anatomy and Physiology of the Circulatory System:  -Group instruction provided by verbal instruction, video, and written materials to support subject.  Reviews functional anatomy of heart, how it relates to various diagnoses, and what role the heart plays in the overall system.   Knowledge Questionnaire Score:     Knowledge Questionnaire Score - 02/04/17 1406      Knowledge Questionnaire Score   Pre Score 21/24      Core Components/Risk Factors/Patient Goals at Admission:     Personal Goals and Risk Factors at Admission - 02/04/17 1413      Core Components/Risk Factors/Patient Goals on Admission    Weight Management Yes;Weight Maintenance;Weight Loss   Intervention Weight Management: Develop a combined nutrition and exercise program designed to reach desired caloric intake, while maintaining appropriate intake of nutrient and fiber, sodium and fats, and appropriate energy expenditure required for the weight goal.;Weight Management: Provide education and appropriate resources to help participant work on and attain dietary goals.;Weight Management/Obesity: Establish reasonable short term and long term weight goals.   Expected Outcomes Short Term: Continue to assess and modify interventions until short term weight is achieved;Long Term: Adherence to nutrition and physical activity/exercise program aimed toward attainment of established weight goal;Weight Maintenance: Understanding of the daily  nutrition guidelines, which includes 25-35% calories from fat, 7% or less cal from saturated fats, less than 200mg  cholesterol, less than 1.5gm of sodium, & 5 or more servings of fruits and vegetables  daily;Weight Loss: Understanding of general recommendations for a balanced deficit meal plan, which promotes 1-2 lb weight loss per week and includes a negative energy balance of [phone] kcal/d;Understanding recommendations for meals to include 15-35% energy as protein, 25-35% energy from fat, 35-60% energy from carbohydrates, less than 200mg  of dietary cholesterol, 20-35 gm of total fiber daily;Understanding of distribution of calorie intake throughout the day with the consumption of 4-5 meals/snacks   Hypertension Yes   Intervention Provide education on lifestyle modifcations including regular physical activity/exercise, weight management, moderate sodium restriction and increased consumption of fresh fruit, vegetables, and low fat dairy, alcohol moderation, and smoking cessation.;Monitor prescription use compliance.   Expected Outcomes Short Term: Continued assessment and intervention until BP is < 140/33mm HG in hypertensive participants. < 130/34mm HG in hypertensive participants with diabetes, heart failure or chronic kidney disease.;Long Term: Maintenance of blood pressure at goal levels.   Lipids Yes   Intervention Provide education and support for participant on nutrition & aerobic/resistive exercise along with prescribed medications to achieve LDL 70mg , HDL >40mg .   Expected Outcomes Short Term: Participant states understanding of desired cholesterol values and is compliant with medications prescribed. Participant is following exercise prescription and nutrition guidelines.;Long Term: Cholesterol controlled with medications as prescribed, with individualized exercise RX and with personalized nutrition plan. Value goals: LDL < 70mg , HDL > 40 mg.      Core Components/Risk Factors/Patient Goals Review:    Core Components/Risk Factors/Patient Goals at Discharge (Final Review):    ITP Comments:     ITP Comments    Row Name 02/04/17 0859           ITP Comments Medical Director, Dr.  Fransico Him          Comments:  Patient attended orientation from 0800 to 1015 to review rules and guidelines for program. As a part of the orientation appt, pt  completed 6 minute walk test, Intitial ITP, and exercise prescription.  VSS. Telemetry-SR/ST with neg QRS this is noted on pt 12 lead ekg from [facility].Marland Kitchen  Asymptomatic. Brief psychosocial assessment reveals no immediate barriers to participating in CR.  Pt pleasant and excited to be in cardiac rehab. Cherre Huger, BSN Cardiac and Training and development officer ;

## 2017-02-08 ENCOUNTER — Encounter (HOSPITAL_COMMUNITY): Payer: Self-pay

## 2017-02-08 ENCOUNTER — Encounter (HOSPITAL_COMMUNITY)
Admission: RE | Admit: 2017-02-08 | Discharge: 2017-02-08 | Disposition: A | Discharge status: Home / Self Care | Payer: Medicare Other | Source: Ambulatory Visit | Attending: Cardiology | Admitting: Cardiology

## 2017-02-08 DIAGNOSIS — I214 Non-ST elevation (NSTEMI) myocardial infarction: Secondary | ICD-10-CM | POA: Diagnosis not present

## 2017-02-08 NOTE — Progress Notes (Signed)
Daily Session Note  Patient Details  Name: Valerie Nash MRN: [id number] Date of Birth: 09-Feb-1946 Referring Provider:     CARDIAC REHAB PHASE II ORIENTATION from 02/04/2017 in [place]  Referring Provider  Fransico Him MD      Encounter Date: 02/08/2017  Check In:     Session Check In - 02/08/17 1533      Check-In   Location [facility]   Staff Present Su Hilt, MS, ACSM RCEP, Exercise Physiologist;Ashley Armstrong, MS, Exercise Physiologist;Holiday Mcmenamin, RN, BSN   Supervising physician immediately available to respond to emergencies [facility] immediately available   Physician(s) Dr. Posey Pronto   Medication changes reported     No   Fall or balance concerns reported    No   Tobacco Cessation No Change   Warm-up and Cool-down Performed as group-led instruction   Resistance Training Performed Yes   VAD Patient? No     Pain Assessment   Currently in Pain? No/denies   Multiple Pain Sites No      Capillary Blood Glucose: No results found for this or any previous visit (from the past 24 hour(s)).    History  Smoking Status  . Former Smoker  . Packs/day: 0.00  . Years: 29.00  . Types: Cigarettes  . Quit date: 02/17/1995  Smokeless Tobacco  . Never Used    Goals Met:  Exercise tolerated well  Goals Unmet:  Not Applicable  Comments: Pt started cardiac rehab today.  Pt tolerated light exercise without difficulty. VSS, telemetry-sinus , asymptomatic.  Medication list reconciled. Pt denies barriers to medicaiton compliance.  PSYCHOSOCIAL ASSESSMENT:  PHQ-0. Marland Kitchen Pt exhibits positive coping skills, hopeful outlook with supportive family. No psychosocial needs identified at this time, no psychosocial interventions necessary.    Pt enjoys gardening, reading, traveling and dog training.  Pt goals for cardiac rehab are to decrease CAD risk factors to avoid another cardiac event.  Pt encouraged to participate in exercise  and education offerings to achieve this goal.     Pt oriented to exercise equipment and routine.    Understanding verbalized.   Dr. Fransico Him is Medical Director for Cardiac Rehab at [facility].

## 2017-02-10 ENCOUNTER — Encounter (HOSPITAL_COMMUNITY)
Admission: RE | Admit: 2017-02-10 | Discharge: 2017-02-10 | Disposition: A | Discharge status: Home / Self Care | Payer: Medicare Other | Source: Ambulatory Visit | Attending: Cardiology | Admitting: Cardiology

## 2017-02-10 DIAGNOSIS — I214 Non-ST elevation (NSTEMI) myocardial infarction: Secondary | ICD-10-CM

## 2017-02-12 ENCOUNTER — Encounter (HOSPITAL_COMMUNITY): Payer: Medicare Other

## 2017-02-15 ENCOUNTER — Encounter (HOSPITAL_COMMUNITY)
Admission: RE | Admit: 2017-02-15 | Discharge: 2017-02-15 | Disposition: A | Discharge status: Home / Self Care | Payer: Medicare Other | Source: Ambulatory Visit | Attending: Cardiology | Admitting: Cardiology

## 2017-02-15 DIAGNOSIS — I214 Non-ST elevation (NSTEMI) myocardial infarction: Secondary | ICD-10-CM | POA: Diagnosis not present

## 2017-02-17 ENCOUNTER — Encounter (HOSPITAL_COMMUNITY)
Admission: RE | Admit: 2017-02-17 | Discharge: 2017-02-17 | Disposition: A | Discharge status: Home / Self Care | Payer: Medicare Other | Source: Ambulatory Visit | Attending: Cardiology | Admitting: Cardiology

## 2017-02-17 DIAGNOSIS — I214 Non-ST elevation (NSTEMI) myocardial infarction: Secondary | ICD-10-CM

## 2017-02-18 NOTE — Progress Notes (Signed)
On 02/17/17 Reviewed home exercise program with pt.  Discussed mode/frequency of exercise, THRR, RPE scale and weather conditions for exercising outdoors.  Also discussed signs and symptoms and when to call Dr./911.  Pt verbalized understanding.  Cleda Mccreedy, [place] ACSM RCEP 02/18/2017 1642

## 2017-02-19 ENCOUNTER — Encounter (HOSPITAL_COMMUNITY)
Admission: RE | Admit: 2017-02-19 | Discharge: 2017-02-19 | Disposition: A | Discharge status: Home / Self Care | Payer: Medicare Other | Source: Ambulatory Visit | Attending: Cardiology | Admitting: Cardiology

## 2017-02-19 DIAGNOSIS — I214 Non-ST elevation (NSTEMI) myocardial infarction: Secondary | ICD-10-CM | POA: Diagnosis not present

## 2017-02-22 ENCOUNTER — Encounter (HOSPITAL_COMMUNITY)
Admission: RE | Admit: 2017-02-22 | Discharge: 2017-02-22 | Disposition: A | Discharge status: Home / Self Care | Payer: Medicare Other | Source: Ambulatory Visit | Attending: Cardiology | Admitting: Cardiology

## 2017-02-24 ENCOUNTER — Encounter (HOSPITAL_COMMUNITY): Payer: Medicare Other

## 2017-02-26 ENCOUNTER — Encounter (HOSPITAL_COMMUNITY)
Admission: RE | Admit: 2017-02-26 | Discharge: 2017-02-26 | Disposition: A | Discharge status: Home / Self Care | Payer: Medicare Other | Source: Ambulatory Visit | Attending: Cardiology | Admitting: Cardiology

## 2017-02-26 DIAGNOSIS — I214 Non-ST elevation (NSTEMI) myocardial infarction: Secondary | ICD-10-CM

## 2017-03-01 ENCOUNTER — Encounter (HOSPITAL_COMMUNITY): Payer: Medicare Other

## 2017-03-03 ENCOUNTER — Encounter (HOSPITAL_COMMUNITY): Payer: Self-pay | Admitting: *Deleted

## 2017-03-03 ENCOUNTER — Encounter (HOSPITAL_COMMUNITY)
Admission: RE | Admit: 2017-03-03 | Discharge: 2017-03-03 | Disposition: A | Discharge status: Home / Self Care | Payer: Medicare Other | Source: Ambulatory Visit | Attending: Cardiology | Admitting: Cardiology

## 2017-03-03 DIAGNOSIS — I214 Non-ST elevation (NSTEMI) myocardial infarction: Secondary | ICD-10-CM

## 2017-03-03 NOTE — Progress Notes (Signed)
Cardiac Individual Treatment Plan  Patient Details  Name: Valerie Nash MRN: [id number] Date of Birth: 1946-09-14 Referring Provider:     CARDIAC REHAB PHASE II ORIENTATION from 02/04/2017 in [place]  Referring Provider  Fransico Him MD      Initial Encounter Date:    CARDIAC REHAB PHASE II ORIENTATION from 02/04/2017 in [place]  Date  02/04/17  Referring Provider  Fransico Him MD      Visit Diagnosis: 12/13/16 NSTEMI (non-ST elevated myocardial infarction) [facility])  Patient's Home Medications on Admission:  Current Outpatient Prescriptions:  .  acetaminophen (TYLENOL) 500 MG tablet, Take 1,000 mg by mouth daily as needed for headache (pain)., Disp: , Rfl:  .  carvedilol (COREG) 3.125 MG tablet, TAKE 1 TABLET BY MOUTH TWICE DAILY WITH MEALS, Disp: 180 tablet, Rfl: 0 .  cholecalciferol (VITAMIN D) 1000 units tablet, Take 1,000 Units by mouth daily., Disp: , Rfl:  .  Coenzyme Q10 (COQ10) 200 MG CAPS, Take 200 mg by mouth at bedtime., Disp: , Rfl:  .  dicyclomine (BENTYL) 10 MG capsule, Take 10 mg by mouth daily as needed for spasms., Disp: , Rfl:  .  ezetimibe (ZETIA) 10 MG tablet, Take 1 tablet (10 mg total) by mouth daily. (Patient taking differently: Take 10 mg by mouth at bedtime. ), Disp: 30 tablet, Rfl: 11 .  fish oil-omega-3 fatty acids 1000 MG capsule, Take 1,000 mg by mouth at bedtime. , Disp: , Rfl:  .  losartan (COZAAR) 25 MG tablet, Take 1 tablet (25 mg total) by mouth daily., Disp: 90 tablet, Rfl: 3 .  Multiple Vitamin (MULTIVITAMIN WITH MINERALS) TABS, Take 1 tablet by mouth daily., Disp: , Rfl:  .  nitroGLYCERIN (NITROSTAT) 0.4 MG SL tablet, Place 1 tablet (0.4 mg total) under the tongue every 5 (five) minutes x 3 doses as needed for chest pain., Disp: 25 tablet, Rfl: 12 .  OVER THE COUNTER MEDICATION, Take 1 tablet by mouth daily. Biotin with collagen, Disp: , Rfl:  .  OVER THE COUNTER MEDICATION, Take 1  tablet by mouth at bedtime as needed (sleep). REM - sleep aid from [employer] (melatonin, valerian root, magnesium, etc.), Disp: , Rfl:  .  venlafaxine XR (EFFEXOR-XR) 75 MG 24 hr capsule, Take 75 mg by mouth daily., Disp: , Rfl:  .  vitamin B-12 (CYANOCOBALAMIN) 1000 MCG tablet, Take 1,000 mcg by mouth daily., Disp: , Rfl:   Past Medical History: Past Medical History:  Diagnosis Date  . Anxiety   . 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 Cancer ([place])   . Colon polyps 2004   4 adenomatous polyps  . Depression   . Dyslipidemia   . Hypertension   . IBS (irritable bowel syndrome)   . Insomnia   . NSTEMI (non-ST elevated myocardial infarction) ([place])   . Obesity (BMI 30-39.9)   . PAT (paroxysmal atrial tachycardia) ([place])    a. first noted on tele 12/2016 during admission for Takotsubo.  . Pulmonary hypertension ([place])    a. Dx 12/2016 in setting of Takotsubo cardiomyopathy, EF 10-15%.  . RBBB (right bundle branch block with left anterior fascicular block)   . Sinusitis   . Takotsubo cardiomyopathy    a. in 2014 -> EF improved shortly after to 57%. b. recurrent Takotsubo in 12/2016 with EF down to 10-15%, normal coronaries.    Tobacco Use: History  Smoking Status  . Former Smoker  . Packs/day: 0.00  .  Years: 29.00  . Types: Cigarettes  . Quit date: 02/17/1995  Smokeless Tobacco  . Never Used    Labs: Recent Review Flowsheet Data    Labs for ITP Cardiac and Pulmonary Rehab Latest Ref Rng & Units 03/12/2014 12/13/2016 12/14/2016 12/14/2016 12/14/2016   Cholestrol 0 - 200 mg/dL 166 - 209(H) - -   LDLCALC 0 - 99 mg/dL 90 - 135(H) - -   LDLDIRECT mg/dL - - - - -   HDL >40 mg/dL 59.20 - 55 - -   Trlycerides <150 mg/dL 86.0 - 96 - -   Hemoglobin A1c 4.8 - 5.6 % - 5.8(H) - - -   PHART 7.350 - 7.450 - - - 7.432 -   PCO2ART 32.0 - 48.0 mmHg - - - 35.7 -   HCO3 20.0 - 28.0 mmol/L - - - 23.8 24.9   TCO2 0 - 100 mmol/L - - - 25 26   O2SAT % - - - 94.0 69.0      Capillary Blood Glucose: No results  found for: GLUCAP   Exercise Target Goals:    Exercise Program Goal: Individual exercise prescription set with THRR, safety & activity barriers. Participant demonstrates ability to understand and report RPE using BORG scale, to self-measure pulse accurately, and to acknowledge the importance of the exercise prescription.  Exercise Prescription Goal: Starting with aerobic activity 30 plus minutes a day, 3 days per week for initial exercise prescription. Provide home exercise prescription and guidelines that participant acknowledges understanding prior to discharge.  Activity Barriers & Risk Stratification:     Activity Barriers & Cardiac Risk Stratification - 02/04/17 0902      Activity Barriers & Cardiac Risk Stratification   Activity Barriers Deconditioning;Muscular Weakness   Cardiac Risk Stratification High      6 Minute Walk:     6 Minute Walk    Row Name 02/04/17 1406         6 Minute Walk   Phase Initial     Distance 1900 feet     Walk Time 6 minutes     # of Rest Breaks 0     MPH 3.6     METS 3.49     RPE 8     VO2 Peak 12.2     Symptoms No     Resting HR 87 bpm     Resting BP 124/70     Max Ex. HR 114 bpm     Max Ex. BP 124/84     2 Minute Post BP 121/80        Oxygen Initial Assessment:   Oxygen Re-Evaluation:   Oxygen Discharge (Final Oxygen Re-Evaluation):   Initial Exercise Prescription:     Initial Exercise Prescription - 02/04/17 1400      Date of Initial Exercise RX and Referring Provider   Date 02/04/17   Referring Provider Fransico Him MD     Treadmill   MPH 3   Grade 1   Minutes 10   METs 3.71     Bike   Level 0.7   Minutes 10   METs 2.76     NuStep   Level 3   SPM 80   Minutes 10   METs 2     Prescription Details   Frequency (times per week) 3   Duration Progress to 30 minutes of continuous aerobic without signs/symptoms of physical distress     Intensity   THRR 40-80% of Max Heartrate 60-120   Ratings of  Perceived Exertion 11-13   Perceived Dyspnea 0-4     Progression   Progression Continue to progress workloads to maintain intensity without signs/symptoms of physical distress.     Resistance Training   Training Prescription Yes   Weight 3lbs   Reps 10-15      Perform Capillary Blood Glucose checks as needed.  Exercise Prescription Changes:     Exercise Prescription Changes    Row Name 02/18/17 1600             Response to Exercise   Blood Pressure (Admit) 106/78       Blood Pressure (Exercise) 138/84       Blood Pressure (Exit) 127/88       Heart Rate (Admit) 85 bpm       Heart Rate (Exercise) 130 bpm       Heart Rate (Exit) 93 bpm       Rating of Perceived Exertion (Exercise) 11       Duration Progress to 45 minutes of aerobic exercise without signs/symptoms of physical distress       Intensity THRR unchanged         Progression   Progression Continue to progress workloads to maintain intensity without signs/symptoms of physical distress.       Average METs 3.1         Resistance Training   Training Prescription Yes       Weight 3lbs       Reps 10-15         Treadmill   MPH 3       Grade 1       Minutes 10       METs 3.71         Bike   Level 0.7       Minutes 10       METs 2.76         NuStep   Level 3       SPM 80       Minutes 10       METs 2.9         Home Exercise Plan   Plans to continue exercise at Home (comment)       Frequency Add 3 additional days to program exercise sessions.       Initial Home Exercises Provided 02/17/17          Exercise Comments:     Exercise Comments    Row Name 03/03/17 5638           Exercise Comments Pt has been absent for the past week.  She is on vacation in [place] and plans to return soon.           Exercise Goals and Review:     Exercise Goals    Row Name 02/04/17 0901             Exercise Goals   Increase Physical Activity Yes       Intervention Provide advice, education, support  and counseling about physical activity/exercise needs.;Develop an individualized exercise prescription for aerobic and resistive training based on initial evaluation findings, risk stratification, comorbidities and participant's personal goals.       Expected Outcomes Achievement of increased cardiorespiratory fitness and enhanced flexibility, muscular endurance and strength shown through measurements of functional capacity and personal statement of participant.       Increase Strength and Stamina Yes       Intervention Provide advice, education, support and  counseling about physical activity/exercise needs.;Develop an individualized exercise prescription for aerobic and resistive training based on initial evaluation findings, risk stratification, comorbidities and participant's personal goals.       Expected Outcomes Achievement of increased cardiorespiratory fitness and enhanced flexibility, muscular endurance and strength shown through measurements of functional capacity and personal statement of participant.          Exercise Goals Re-Evaluation :    Discharge Exercise Prescription (Final Exercise Prescription Changes):     Exercise Prescription Changes - 02/18/17 1600      Response to Exercise   Blood Pressure (Admit) 106/78   Blood Pressure (Exercise) 138/84   Blood Pressure (Exit) 127/88   Heart Rate (Admit) 85 bpm   Heart Rate (Exercise) 130 bpm   Heart Rate (Exit) 93 bpm   Rating of Perceived Exertion (Exercise) 11   Duration Progress to 45 minutes of aerobic exercise without signs/symptoms of physical distress   Intensity THRR unchanged     Progression   Progression Continue to progress workloads to maintain intensity without signs/symptoms of physical distress.   Average METs 3.1     Resistance Training   Training Prescription Yes   Weight 3lbs   Reps 10-15     Treadmill   MPH 3   Grade 1   Minutes 10   METs 3.71     Bike   Level 0.7   Minutes 10   METs 2.76      NuStep   Level 3   SPM 80   Minutes 10   METs 2.9     Home Exercise Plan   Plans to continue exercise at Home (comment)   Frequency Add 3 additional days to program exercise sessions.   Initial Home Exercises Provided 02/17/17      Nutrition:  Target Goals: Understanding of nutrition guidelines, daily intake of sodium 1500mg , cholesterol 200mg , calories 30% from fat and 7% or less from saturated fats, daily to have 5 or more servings of fruits and vegetables.  Biometrics:     Pre Biometrics - 02/04/17 1411      Pre Biometrics   Waist Circumference 36.5 inches   Hip Circumference 45 inches   Waist to Hip Ratio 0.81 %   Triceps Skinfold 36 mm   % Body Fat 41.6 %   Grip Strength 26 kg   Flexibility 14 in   Single Leg Stand 7.12 seconds       Nutrition Therapy Plan and Nutrition Goals:   Nutrition Discharge: Nutrition Scores:   Nutrition Goals Re-Evaluation:   Nutrition Goals Re-Evaluation:   Nutrition Goals Discharge (Final Nutrition Goals Re-Evaluation):   Psychosocial: Target Goals: Acknowledge presence or absence of significant depression and/or stress, maximize coping skills, provide positive support system. Participant is able to verbalize types and ability to use techniques and skills needed for reducing stress and depression.  Initial Review & Psychosocial Screening:     Initial Psych Review & Screening - 02/04/17 1556      Initial Review   Current issues with Current Anxiety/Panic     Family Dynamics   Good Support System? Yes     Barriers   Psychosocial barriers to participate in program There are no identifiable barriers or psychosocial needs.      Quality of Life Scores:     Quality of Life - 02/04/17 1426      Quality of Life Scores   Health/Function Pre 24.5 %   Socioeconomic Pre 28.33 %   Psych/Spiritual Pre  23.21 %   Family Pre 22.88 %   GLOBAL Pre 24.75 %      PHQ-9: Recent Review Flowsheet Data    Depression  screen [facility] 2/9 02/08/2017   Decreased Interest 0   Down, Depressed, Hopeless 0   PHQ - 2 Score 0     Interpretation of Total Score  Total Score Depression Severity:  1-4 = Minimal depression, 5-9 = Mild depression, 10-14 = Moderate depression, 15-19 = Moderately severe depression, 20-27 = Severe depression   Psychosocial Evaluation and Intervention:     Psychosocial Evaluation - 02/08/17 1656      Psychosocial Evaluation & Interventions   Interventions Encouraged to exercise with the program and follow exercise prescription   Comments no psychosocial needs identified, no interventions necessary.     Expected Outcomes pt will exhibit positive outlook with good coping skills.    Continue Psychosocial Services  No Follow up required      Psychosocial Re-Evaluation:     Psychosocial Re-Evaluation    [place] Name 03/03/17 1750             Psychosocial Re-Evaluation   Current issues with None Identified       Interventions Encouraged to attend Cardiac Rehabilitation for the exercise       Continue Psychosocial Services  No Follow up required          Psychosocial Discharge (Final Psychosocial Re-Evaluation):     Psychosocial Re-Evaluation - 03/03/17 1750      Psychosocial Re-Evaluation   Current issues with None Identified   Interventions Encouraged to attend Cardiac Rehabilitation for the exercise   Continue Psychosocial Services  No Follow up required      Vocational Rehabilitation: Provide vocational rehab assistance to qualifying candidates.   Vocational Rehab Evaluation & Intervention:     Vocational Rehab - 02/04/17 1556      Initial Vocational Rehab Evaluation & Intervention   Assessment shows need for Vocational Rehabilitation No      Education: Education Goals: Education classes will be provided on a weekly basis, covering required topics. Participant will state understanding/return demonstration of topics presented.  Learning Barriers/Preferences:      Learning Barriers/Preferences - 02/04/17 0901      Learning Barriers/Preferences   Learning Barriers Sight  cataract surgery/ contacts   Learning Preferences Written Material;Pictoral      Education Topics: Count Your Pulse:  -Group instruction provided by verbal instruction, demonstration, patient participation and written materials to support subject.  Instructors address importance of being able to find your pulse and how to count your pulse when at home without a heart monitor.  Patients get hands on experience counting their pulse with staff help and individually.   Heart Attack, Angina, and Risk Factor Modification:  -Group instruction provided by verbal instruction, video, and written materials to support subject.  Instructors address signs and symptoms of angina and heart attacks.    Also discuss risk factors for heart disease and how to make changes to improve heart health risk factors.   Functional Fitness:  -Group instruction provided by verbal instruction, demonstration, patient participation, and written materials to support subject.  Instructors address safety measures for doing things around the house.  Discuss how to get up and down off the floor, how to pick things up properly, how to safely get out of a chair without assistance, and balance training.   CARDIAC REHAB PHASE II EXERCISE from 02/26/2017 in [place]  Date  02/26/17  Instruction Review Code  2- meets goals/outcomes      Meditation and Mindfulness:  -Group instruction provided by verbal instruction, patient participation, and written materials to support subject.  Instructor addresses importance of mindfulness and meditation practice to help reduce stress and improve awareness.  Instructor also leads participants through a meditation exercise.    Stretching for Flexibility and Mobility:  -Group instruction provided by verbal instruction, patient participation, and  written materials to support subject.  Instructors lead participants through series of stretches that are designed to increase flexibility thus improving mobility.  These stretches are additional exercise for major muscle groups that are typically performed during regular warm up and cool down.   Hands Only CPR:  -Group verbal, video, and participation provides a basic overview of AHA guidelines for community CPR. Role-play of emergencies allow participants the opportunity to practice calling for help and chest compression technique with discussion of AED use.   Hypertension: -Group verbal and written instruction that provides a basic overview of hypertension including the most recent diagnostic guidelines, risk factor reduction with self-care instructions and medication management.    Nutrition I class: Heart Healthy Eating:  -Group instruction provided by PowerPoint slides, verbal discussion, and written materials to support subject matter. The instructor gives an explanation and review of the Therapeutic Lifestyle Changes diet recommendations, which includes a discussion on lipid goals, dietary fat, sodium, fiber, plant stanol/sterol esters, sugar, and the components of a well-balanced, healthy diet.   Nutrition II class: Lifestyle Skills:  -Group instruction provided by PowerPoint slides, verbal discussion, and written materials to support subject matter. The instructor gives an explanation and review of label reading, grocery shopping for heart health, heart healthy recipe modifications, and ways to make healthier choices when eating out.   Diabetes Question & Answer:  -Group instruction provided by PowerPoint slides, verbal discussion, and written materials to support subject matter. The instructor gives an explanation and review of diabetes co-morbidities, pre- and post-prandial blood glucose goals, pre-exercise blood glucose goals, signs, symptoms, and treatment of hypoglycemia and  hyperglycemia, and foot care basics.   Diabetes Blitz:  -Group instruction provided by PowerPoint slides, verbal discussion, and written materials to support subject matter. The instructor gives an explanation and review of the physiology behind type 1 and type 2 diabetes, diabetes medications and rational behind using different medications, pre- and post-prandial blood glucose recommendations and Hemoglobin A1c goals, diabetes diet, and exercise including blood glucose guidelines for exercising safely.    Portion Distortion:  -Group instruction provided by PowerPoint slides, verbal discussion, written materials, and food models to support subject matter. The instructor gives an explanation of serving size versus portion size, changes in portions sizes over the last 20 years, and what consists of a serving from each food group.   Stress Management:  -Group instruction provided by verbal instruction, video, and written materials to support subject matter.  Instructors review role of stress in heart disease and how to cope with stress positively.     CARDIAC REHAB PHASE II EXERCISE from 02/26/2017 in [place]  Date  02/10/17  Instruction Review Code  2- meets goals/outcomes      Exercising on Your Own:  -Group instruction provided by verbal instruction, power point, and written materials to support subject.  Instructors discuss benefits of exercise, components of exercise, frequency and intensity of exercise, and end points for exercise.  Also discuss use of nitroglycerin and activating EMS.  Review options of places to exercise  outside of rehab.  Review guidelines for sex with heart disease.   Cardiac Drugs I:  -Group instruction provided by verbal instruction and written materials to support subject.  Instructor reviews cardiac drug classes: antiplatelets, anticoagulants, beta blockers, and statins.  Instructor discusses reasons, side effects, and lifestyle  considerations for each drug class.   Cardiac Drugs II:  -Group instruction provided by verbal instruction and written materials to support subject.  Instructor reviews cardiac drug classes: angiotensin converting enzyme inhibitors (ACE-I), angiotensin II receptor blockers (ARBs), nitrates, and calcium channel blockers.  Instructor discusses reasons, side effects, and lifestyle considerations for each drug class.   Anatomy and Physiology of the Circulatory System:  Group verbal and written instruction and models provide basic cardiac anatomy and physiology, with the coronary electrical and arterial systems. Review of: AMI, Angina, Valve disease, Heart Failure, Peripheral Artery Disease, Cardiac Arrhythmia, Pacemakers, and the ICD.   Other Education:  -Group or individual verbal, written, or video instructions that support the educational goals of the cardiac rehab program.   Knowledge Questionnaire Score:     Knowledge Questionnaire Score - 02/04/17 1406      Knowledge Questionnaire Score   Pre Score 21/24      Core Components/Risk Factors/Patient Goals at Admission:     Personal Goals and Risk Factors at Admission - 02/04/17 1413      Core Components/Risk Factors/Patient Goals on Admission    Weight Management Yes;Weight Maintenance;Weight Loss   Intervention Weight Management: Develop a combined nutrition and exercise program designed to reach desired caloric intake, while maintaining appropriate intake of nutrient and fiber, sodium and fats, and appropriate energy expenditure required for the weight goal.;Weight Management: Provide education and appropriate resources to help participant work on and attain dietary goals.;Weight Management/Obesity: Establish reasonable short term and long term weight goals.   Expected Outcomes Short Term: Continue to assess and modify interventions until short term weight is achieved;Long Term: Adherence to nutrition and physical activity/exercise  program aimed toward attainment of established weight goal;Weight Maintenance: Understanding of the daily nutrition guidelines, which includes 25-35% calories from fat, 7% or less cal from saturated fats, less than 200mg  cholesterol, less than 1.5gm of sodium, & 5 or more servings of fruits and vegetables daily;Weight Loss: Understanding of general recommendations for a balanced deficit meal plan, which promotes 1-2 lb weight loss per week and includes a negative energy balance of [phone] kcal/d;Understanding recommendations for meals to include 15-35% energy as protein, 25-35% energy from fat, 35-60% energy from carbohydrates, less than 200mg  of dietary cholesterol, 20-35 gm of total fiber daily;Understanding of distribution of calorie intake throughout the day with the consumption of 4-5 meals/snacks   Hypertension Yes   Intervention Provide education on lifestyle modifcations including regular physical activity/exercise, weight management, moderate sodium restriction and increased consumption of fresh fruit, vegetables, and low fat dairy, alcohol moderation, and smoking cessation.;Monitor prescription use compliance.   Expected Outcomes Short Term: Continued assessment and intervention until BP is < 140/67mm HG in hypertensive participants. < 130/16mm HG in hypertensive participants with diabetes, heart failure or chronic kidney disease.;Long Term: Maintenance of blood pressure at goal levels.   Lipids Yes   Intervention Provide education and support for participant on nutrition & aerobic/resistive exercise along with prescribed medications to achieve LDL 70mg , HDL >40mg .   Expected Outcomes Short Term: Participant states understanding of desired cholesterol values and is compliant with medications prescribed. Participant is following exercise prescription and nutrition guidelines.;Long Term: Cholesterol controlled with medications as prescribed, with  individualized exercise RX and with personalized  nutrition plan. Value goals: LDL < 70mg , HDL > 40 mg.      Core Components/Risk Factors/Patient Goals Review:    Core Components/Risk Factors/Patient Goals at Discharge (Final Review):    ITP Comments:     ITP Comments    Row Name 02/04/17 0859           ITP Comments Medical Director, Dr. Fransico Him          Comments: Jilleen is making expected progress toward personal goals after completing 6 sessions. Recommend continued exercise and life style modification education including  stress management and relaxation techniques to decrease cardiac risk profile. Audree has been out this week due to vacation.Barnet Pall, RN,BSN 03/03/2017 5:52 PM

## 2017-03-05 ENCOUNTER — Encounter (HOSPITAL_COMMUNITY): Admission: RE | Admit: 2017-03-05 | Payer: Medicare Other | Source: Ambulatory Visit

## 2017-03-10 ENCOUNTER — Encounter (HOSPITAL_COMMUNITY)
Admission: RE | Admit: 2017-03-10 | Discharge: 2017-03-10 | Disposition: A | Discharge status: Home / Self Care | Payer: Medicare Other | Source: Ambulatory Visit | Attending: Cardiology | Admitting: Cardiology

## 2017-03-10 VITALS — Wt 168.7 lb

## 2017-03-10 DIAGNOSIS — C44719 Basal cell carcinoma of skin of left lower limb, including hip: Secondary | ICD-10-CM | POA: Diagnosis not present

## 2017-03-10 DIAGNOSIS — I214 Non-ST elevation (NSTEMI) myocardial infarction: Secondary | ICD-10-CM | POA: Diagnosis not present

## 2017-03-12 ENCOUNTER — Encounter (HOSPITAL_COMMUNITY): Payer: Medicare Other

## 2017-03-15 ENCOUNTER — Encounter (HOSPITAL_COMMUNITY): Payer: Medicare Other

## 2017-03-17 ENCOUNTER — Encounter (HOSPITAL_COMMUNITY): Payer: Medicare Other

## 2017-03-19 ENCOUNTER — Encounter (HOSPITAL_COMMUNITY): Payer: Medicare Other

## 2017-03-22 ENCOUNTER — Encounter (HOSPITAL_COMMUNITY): Admission: RE | Admit: 2017-03-22 | Payer: Medicare Other | Source: Ambulatory Visit

## 2017-03-22 ENCOUNTER — Telehealth (HOSPITAL_COMMUNITY): Payer: Self-pay | Admitting: *Deleted

## 2017-03-22 NOTE — Progress Notes (Signed)
Discharge Summary  Patient Details  Name: Valerie Nash MRN: [id number] Date of Birth: 10/21/1945 Referring Provider:     CARDIAC REHAB PHASE II ORIENTATION from 02/04/2017 in [facility]  Referring Provider  Fransico Him MD       Number of Visits: 8  Reason for Discharge:  Early Exit:  Personal  Smoking History:  History  Smoking Status  . Former Smoker  . Packs/day: 0.00  . Years: 29.00  . Types: Cigarettes  . Quit date: 02/17/1995  Smokeless Tobacco  . Never Used    Diagnosis:  12/13/16 NSTEMI (non-ST elevated myocardial infarction) [facility])  ADL UCSD:   Initial Exercise Prescription:     Initial Exercise Prescription - 02/04/17 1400      Date of Initial Exercise RX and Referring Provider   Date 02/04/17   Referring Provider Fransico Him MD     Treadmill   MPH 3   Grade 1   Minutes 10   METs 3.71     Bike   Level 0.7   Minutes 10   METs 2.76     NuStep   Level 3   SPM 80   Minutes 10   METs 2     Prescription Details   Frequency (times per week) 3   Duration Progress to 30 minutes of continuous aerobic without signs/symptoms of physical distress     Intensity   THRR 40-80% of Max Heartrate 60-120   Ratings of Perceived Exertion 11-13   Perceived Dyspnea 0-4     Progression   Progression Continue to progress workloads to maintain intensity without signs/symptoms of physical distress.     Resistance Training   Training Prescription Yes   Weight 3lbs   Reps 10-15      Discharge Exercise Prescription (Final Exercise Prescription Changes):     Exercise Prescription Changes - 04/08/17 1000      Response to Exercise   Blood Pressure (Admit) 120/80   Blood Pressure (Exercise) 138/82   Blood Pressure (Exit) 114/78   Heart Rate (Admit) 73 bpm   Heart Rate (Exercise) 124 bpm   Heart Rate (Exit) 79 bpm   Rating of Perceived Exertion (Exercise) 12   Duration Progress to 45 minutes of aerobic exercise without  signs/symptoms of physical distress   Intensity THRR unchanged     Progression   Progression Continue to progress workloads to maintain intensity without signs/symptoms of physical distress.   Average METs 3.1     Resistance Training   Training Prescription Yes   Weight 3lbs   Reps 10-15     Treadmill   MPH 3   Grade 1   Minutes 10   METs 3.71     Bike   Level 0.7   Minutes 10   METs 2.76     NuStep   Level 3   SPM 80   Minutes 10   METs 2.9     Home Exercise Plan   Plans to continue exercise at Home (comment)   Frequency Add 3 additional days to program exercise sessions.   Initial Home Exercises Provided 02/17/17      Functional Capacity:     6 Minute Walk    Row Name 02/04/17 1406         6 Minute Walk   Phase Initial     Distance 1900 feet     Walk Time 6 minutes     # of Rest Breaks 0  MPH 3.6     METS 3.49     RPE 8     VO2 Peak 12.2     Symptoms No     Resting HR 87 bpm     Resting BP 124/70     Max Ex. HR 114 bpm     Max Ex. BP 124/84     2 Minute Post BP 121/80        Psychological, QOL, Others - Outcomes: PHQ 2/9: Depression screen PHQ 2/9 02/08/2017  Decreased Interest 0  Down, Depressed, Hopeless 0  PHQ - 2 Score 0    Quality of Life:     Quality of Life - 02/04/17 1426      Quality of Life Scores   Health/Function Pre 24.5 %   Socioeconomic Pre 28.33 %   Psych/Spiritual Pre 23.21 %   Family Pre 22.88 %   GLOBAL Pre 24.75 %      Personal Goals: Goals established at orientation with interventions provided to work toward goal.     Personal Goals and Risk Factors at Admission - 02/04/17 1413      Core Components/Risk Factors/Patient Goals on Admission    Weight Management Yes;Weight Maintenance;Weight Loss   Intervention Weight Management: Develop a combined nutrition and exercise program designed to reach desired caloric intake, while maintaining appropriate intake of nutrient and fiber, sodium and fats, and  appropriate energy expenditure required for the weight goal.;Weight Management: Provide education and appropriate resources to help participant work on and attain dietary goals.;Weight Management/Obesity: Establish reasonable short term and long term weight goals.   Expected Outcomes Short Term: Continue to assess and modify interventions until short term weight is achieved;Long Term: Adherence to nutrition and physical activity/exercise program aimed toward attainment of established weight goal;Weight Maintenance: Understanding of the daily nutrition guidelines, which includes 25-35% calories from fat, 7% or less cal from saturated fats, less than 200mg  cholesterol, less than 1.5gm of sodium, & 5 or more servings of fruits and vegetables daily;Weight Loss: Understanding of general recommendations for a balanced deficit meal plan, which promotes 1-2 lb weight loss per week and includes a negative energy balance of [phone] kcal/d;Understanding recommendations for meals to include 15-35% energy as protein, 25-35% energy from fat, 35-60% energy from carbohydrates, less than 200mg  of dietary cholesterol, 20-35 gm of total fiber daily;Understanding of distribution of calorie intake throughout the day with the consumption of 4-5 meals/snacks   Hypertension Yes   Intervention Provide education on lifestyle modifcations including regular physical activity/exercise, weight management, moderate sodium restriction and increased consumption of fresh fruit, vegetables, and low fat dairy, alcohol moderation, and smoking cessation.;Monitor prescription use compliance.   Expected Outcomes Short Term: Continued assessment and intervention until BP is < 140/59mm HG in hypertensive participants. < 130/61mm HG in hypertensive participants with diabetes, heart failure or chronic kidney disease.;Long Term: Maintenance of blood pressure at goal levels.   Lipids Yes   Intervention Provide education and support for participant on  nutrition & aerobic/resistive exercise along with prescribed medications to achieve LDL 70mg , HDL >40mg .   Expected Outcomes Short Term: Participant states understanding of desired cholesterol values and is compliant with medications prescribed. Participant is following exercise prescription and nutrition guidelines.;Long Term: Cholesterol controlled with medications as prescribed, with individualized exercise RX and with personalized nutrition plan. Value goals: LDL < 70mg , HDL > 40 mg.       Personal Goals Discharge:   Nutrition & Weight - Outcomes:     Pre Biometrics -  02/04/17 1411      Pre Biometrics   Waist Circumference 36.5 inches   Hip Circumference 45 inches   Waist to Hip Ratio 0.81 %   Triceps Skinfold 36 mm   % Body Fat 41.6 %   Grip Strength 26 kg   Flexibility 14 in   Single Leg Stand 7.12 seconds         Post Biometrics - 04/08/17 1009       Post  Biometrics   Weight 168 lb 10.4 oz (76.5 kg)      Nutrition:   Nutrition Discharge:   Education Questionnaire Score:     Knowledge Questionnaire Score - 02/04/17 1406      Knowledge Questionnaire Score   Pre Score 21/24      Emrey attended 8 exercise sessions and decided that she would rather exercise on her own and work with a Physiological scientist. Kattaleya's attendance was sporadic due to other obligations. Saje said she enjoyed partcipating in the program when she was able to attend. Ziyanna's last day of attendance was on 03/10/2017.Barnet Pall, RN,BSN 04/19/2017 8:50 AM

## 2017-03-24 ENCOUNTER — Encounter (HOSPITAL_COMMUNITY)
Admission: RE | Admit: 2017-03-24 | Discharge: 2017-03-24 | Disposition: A | Discharge status: Home / Self Care | Payer: Medicare Other | Source: Ambulatory Visit | Attending: Cardiology | Admitting: Cardiology

## 2017-03-24 DIAGNOSIS — I214 Non-ST elevation (NSTEMI) myocardial infarction: Secondary | ICD-10-CM

## 2017-03-26 ENCOUNTER — Encounter (HOSPITAL_COMMUNITY): Payer: Medicare Other

## 2017-03-29 ENCOUNTER — Encounter (HOSPITAL_COMMUNITY): Payer: Medicare Other

## 2017-03-31 ENCOUNTER — Encounter (HOSPITAL_COMMUNITY): Payer: Medicare Other

## 2017-04-01 ENCOUNTER — Encounter: Payer: Self-pay | Admitting: Cardiology

## 2017-04-01 ENCOUNTER — Ambulatory Visit (INDEPENDENT_AMBULATORY_CARE_PROVIDER_SITE_OTHER): Payer: Medicare Other | Admitting: Cardiology

## 2017-04-01 VITALS — BP 118/74 | HR 66 | Ht 63.0 in | Wt 168.0 lb

## 2017-04-01 DIAGNOSIS — E785 Hyperlipidemia, unspecified: Secondary | ICD-10-CM | POA: Diagnosis not present

## 2017-04-01 DIAGNOSIS — I471 Supraventricular tachycardia: Secondary | ICD-10-CM

## 2017-04-01 DIAGNOSIS — I10 Essential (primary) hypertension: Secondary | ICD-10-CM | POA: Diagnosis not present

## 2017-04-01 DIAGNOSIS — I4719 Other supraventricular tachycardia: Secondary | ICD-10-CM

## 2017-04-01 DIAGNOSIS — I5181 Takotsubo syndrome: Secondary | ICD-10-CM | POA: Diagnosis not present

## 2017-04-01 NOTE — Progress Notes (Signed)
Cardiology Office Note    Date:  04/01/2017   ID:  Valerie Nash, DOB February 05, 1946, MRN [id number]  PCP:  Josetta Huddle, MD  Cardiologist:  Fransico Him, MD   Chief Complaint  Patient presents with  . Cardiomyopathy  . Hypertension    History of Present Illness:  Valerie Nash is a 71 y.o. 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  She was admitted 12/2016 with chest pain/elevated enzymes with 2D Echo showing EF 15-20% (previously recovered to 57% in 2014), also showed grade 2 DD, mild AI/MR, mod TR/PR, PASP 66mmHg (severe pulm HTN). She underwent LHC 12/14/16 showing normal coronary arteries and fairly normal filling pressures including a PA pressure of 30 systolic, and findings c/w pulmonary HTN per report. Her presentation was felt again due to Takotsubo CM. She also had intermittent episodes of short bursts of nonsustained atrial tachycardia and was therefore continued on her BB. She developed an ACE cough and was changed to an ARB.    She is here today for followup.  She is doing well today.  She denies any chest pain or pressure, SOB, DOE, PND, orthopnea, LE edema, dizziness, palpitations, claudication or syncope. She is tolerating her meds well without any problems except that recently she has had increased mucous production in the throat at night.  She has been seen by ENT twice and does not have GERD.  She is concerned that it may be the Losartan.  Her cough did resolve off ACE I.     Past Medical History:  Diagnosis Date  . Anxiety   . 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 Cancer ([place])   . Colon polyps 2004   4 adenomatous polyps  . Depression   . Dyslipidemia   . Hypertension   . IBS (irritable bowel syndrome)   . Insomnia   . NSTEMI (non-ST elevated myocardial infarction) ([place])   . Obesity (BMI 30-39.9)   . PAT (paroxysmal atrial tachycardia) ([place])    a. first noted on tele 12/2016  during admission for Takotsubo.  . Pulmonary hypertension ([place])    a. Dx 12/2016 in setting of Takotsubo cardiomyopathy, EF 10-15%.  . RBBB (right bundle branch block with left anterior fascicular block)   . Sinusitis   . Takotsubo cardiomyopathy    a. in 2014 -> EF improved shortly after to 57%. b. recurrent Takotsubo in 12/2016 with EF down to 10-15%, normal coronaries.    Past Surgical History:  Procedure Laterality Date  . ABDOMINAL HYSTERECTOMY    . BREAST LUMPECTOMY    . CESAREAN SECTION    . LEFT HEART CATHETERIZATION WITH CORONARY ANGIOGRAM Bilateral 12/26/2012   Procedure: LEFT HEART CATHETERIZATION WITH CORONARY ANGIOGRAM;  Surgeon: Sueanne Margarita, MD;  Location: [facility] CATH LAB;  Service: Cardiovascular;  Laterality: Bilateral;  . RIGHT/LEFT HEART CATH AND CORONARY ANGIOGRAPHY N/A 12/14/2016   Procedure: Right/Left Heart Cath and Coronary Angiography;  Surgeon: Lorretta Harp, MD;  Location: [place] [facility] CV LAB;  Service: Cardiovascular;  Laterality: N/A;  . SHOULDER SURGERY      Current Medications: Current Meds  Medication Sig  . acetaminophen (TYLENOL) 500 MG tablet Take 1,000 mg by mouth daily as needed for headache (pain).  . carvedilol (COREG) 3.125 MG tablet TAKE 1 TABLET BY MOUTH TWICE DAILY WITH MEALS  . cholecalciferol (VITAMIN D) 1000 units tablet Take 1,000 Units by mouth daily.  . Coenzyme Q10 (COQ10) 200 MG  CAPS Take 200 mg by mouth at bedtime.  . dicyclomine (BENTYL) 10 MG capsule Take 10 mg by mouth daily as needed for spasms.  Marland Kitchen ezetimibe (ZETIA) 10 MG tablet Take 10 mg by mouth at bedtime.  . fish oil-omega-3 fatty acids 1000 MG capsule Take 1,000 mg by mouth at bedtime.   . Multiple Vitamin (MULTIVITAMIN WITH MINERALS) TABS Take 1 tablet by mouth daily.  . nitroGLYCERIN (NITROSTAT) 0.4 MG SL tablet Place 1 tablet (0.4 mg total) under the tongue every 5 (five) minutes x 3 doses as needed for chest pain.  Marland Kitchen OVER THE COUNTER MEDICATION Take 1 tablet by mouth daily.  Biotin with collagen  . OVER THE COUNTER MEDICATION Take 1 tablet by mouth at bedtime as needed (sleep). REM - sleep aid from [employer] (melatonin, valerian root, magnesium, etc.)  . venlafaxine XR (EFFEXOR-XR) 75 MG 24 hr capsule Take 75 mg by mouth daily.  . vitamin B-12 (CYANOCOBALAMIN) 1000 MCG tablet Take 1,000 mcg by mouth daily.  . [DISCONTINUED] ezetimibe (ZETIA) 10 MG tablet Take 1 tablet (10 mg total) by mouth daily. (Patient taking differently: Take 10 mg by mouth at bedtime. )    Allergies:   Ambien [zolpidem tartrate]; Pravastatin; Simvastatin; and Topamax [topiramate]   Social History   Social History  . Marital status: Widowed    Spouse name: N/A  . Number of children: 2  . 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 Smoking status: Former Smoker    Packs/day: 0.00    Years: 29.00    Types: Cigarettes    Quit date: 02/17/1995  . Smokeless tobacco: Never Used  . Alcohol use Yes     Comment: socially  . Drug use: No  . Sexual activity: Not Asked   Other Topics Concern  . None   Social History Narrative  . None     Family History:  The patient's family history includes Breast cancer (age of onset: 62) in her sister; Cervical cancer (age of onset: 54) in her daughter; Colon cancer (age of onset: 72) in her father; Colon polyps in her brother; Congestive Heart Failure in her maternal grandmother and mother; Lung cancer in her maternal grandfather and maternal uncle.   ROS:   Please see the history of present illness.    ROS All other systems reviewed and are negative.  No flowsheet data found.      PHYSICAL EXAM:   VS:  BP 118/74   Pulse 66   Ht 5\' 3"  (1.6 m)   Wt 168 lb (76.2 kg)   LMP  (LMP Unknown)   BMI 29.76 kg/m    GEN: Well nourished, well developed, in no acute distress  HEENT: normal  Neck: no JVD, carotid bruits, or masses Cardiac: RRR; no murmurs, rubs, or gallops,no  edema.  Intact distal pulses bilaterally.  Respiratory:  clear to auscultation bilaterally, normal work of breathing GI: soft, nontender, nondistended, + BS MS: no deformity or atrophy  Skin: warm and dry, no rash Neuro:  Alert and Oriented x 3, Strength and sensation are intact Psych: euthymic mood, full affect  Wt Readings from Last 3 Encounters:  04/01/17 168 lb (76.2 kg)  02/04/17 168 lb 3.4 oz (76.3 kg)  12/31/16 167 lb (75.8 kg)      Studies/Labs Reviewed:   EKG:  EKG is not ordered today.    Recent  Labs: 12/13/2016: B Natriuretic Peptide 461.5; Magnesium 2.1 12/15/2016: BUN 17; Creatinine, Ser 0.82; Potassium 4.5; Sodium 139 12/16/2016: Hemoglobin 12.6; Platelets 266 12/31/2016: TSH 4.270   Lipid Panel    Component Value Date/Time   CHOL 209 (H) 12/14/2016 0441   TRIG 96 12/14/2016 0441   HDL 55 12/14/2016 0441   CHOLHDL 3.8 12/14/2016 0441   VLDL 19 12/14/2016 0441   LDLCALC 135 (H) 12/14/2016 0441   LDLDIRECT 143.3 08/28/2013 1053    Additional studies/ records that were reviewed today include:  none    ASSESSMENT:    1. Takotsubo cardiomyopathy   2. PAT (paroxysmal atrial tachycardia) ([place])   3. Essential hypertension   4. Dyslipidemia      PLAN:  In order of problems listed above:  1. Takotsubo cardiomyopathy - she is doing well with no complaints.  She appears euvolemic on exam and weight is stable. 2D echo showed resolution of DCM and EF now 60-65% with mildly reduced RVF.   She will continue on Carvedilol 3.125mg  BID.  2. Atrial tachycardia - this is suppressed on BB. She will continue on carvedilol. 3. HTN - her BP is adequately controlled on exam today.  She will continue on BB.   I am going to stop her losartan to see if her throat congestion resolves.  She will check her BP daily for a week and call with the results and also let me know if her congestion resolves. 4. Dyslipidemia - she refuses statin.   Medication Adjustments/Labs and Tests  Ordered: Current medicines are reviewed at length with the patient today.  Concerns regarding medicines are outlined above.  Medication changes, Labs and Tests ordered today are listed in the Patient Instructions below.  There are no Patient Instructions on file for this visit.   Signed, Fransico Him, MD  04/01/2017 8:40 AM    [facility] [place], [place],   [address] Phone: [phone]; Fax: [phone]

## 2017-04-01 NOTE — Patient Instructions (Signed)
Medication Instructions:  1) STOP LOSARTAN  Labwork: None  Testing/Procedures: None  Follow-Up: Your physician wants you to follow-up in: 6 months with Dr. Radford Pax. You will receive a reminder letter in the mail two months in advance. If you don't receive a letter, please call our office to schedule the follow-up appointment.   Any Other Special Instructions Will Be Listed Below (If Applicable). Please check your blood pressure for a week and call with results and to let us know if you are feeling better/the mucous in your throat has improved.     If you need a refill on your cardiac medications before your next appointment, please call your pharmacy.

## 2017-04-02 ENCOUNTER — Encounter (HOSPITAL_COMMUNITY): Payer: Medicare Other

## 2017-04-05 ENCOUNTER — Encounter (HOSPITAL_COMMUNITY): Payer: Medicare Other

## 2017-04-07 ENCOUNTER — Encounter (HOSPITAL_COMMUNITY): Payer: Medicare Other

## 2017-04-08 NOTE — Addendum Note (Signed)
Encounter addended by: Meta Hatchet on: 04/08/2017 10:22 AM<BR>    Actions taken: Flowsheet data copied forward, Flowsheet accepted, Visit Navigator Flowsheet section accepted

## 2017-04-09 ENCOUNTER — Encounter (HOSPITAL_COMMUNITY): Payer: Medicare Other

## 2017-04-09 ENCOUNTER — Telehealth: Payer: Self-pay | Admitting: Cardiology

## 2017-04-09 MED ORDER — CARVEDILOL 6.25 MG PO TABS: 6.2500 mg | ORAL_TABLET | Freq: Two times a day (BID) | ORAL | 0 refills | Status: DC

## 2017-04-09 NOTE — Telephone Encounter (Signed)
DBP too high - increase Coreg to 6.25mg  BID and check BP daily for a week and call with results

## 2017-04-09 NOTE — Telephone Encounter (Signed)
Spoke with pt and went over recommendations per Dr. Radford Pax.  Pt verbalized understanding and was in agreement with this plan. Pt will call next week with BP readings.

## 2017-04-09 NOTE — Telephone Encounter (Signed)
°  New Prob   Was advised to call to give BP readings:  Day 1: 124/88 pulse 70 Day 2: 128/95 pulse 85 Day 3: 130/97 pulse 73  Day 4: 110/85 pulse 65 Day 5: 119/91 pulse 81 Day 6: 127/85 pulse 63 Day 7: 128/88 pulse 63  Pt also states she has noted a some decline in mucous.

## 2017-04-09 NOTE — Telephone Encounter (Signed)
Pt calling, as instructed by Dr Radford Pax, to report her current BP and HR readings for the past week, since losartan has been held.  Pt states that Dr Radford Pax held her losartan for a week, for she was complaining of excessive mucous at hour of sleep.  Pt states that Dr Radford Pax advised her to hold this med x 1 week, record BP and HR, and report if her issues with mucous improved or not, at night time. Pt reports the current readings, as listed.  Pt states that she has noticed a significant improvement with mucous build-up at night, since stopping this med.  Informed the pt that Dr Radford Pax is currently out of the office today, but I will route this message to her for further review and recommendation, and someone from triage will follow-up with her shortly thereafter.  Pt verbalized understanding and agrees with this plan.

## 2017-04-12 ENCOUNTER — Encounter (HOSPITAL_COMMUNITY): Payer: Medicare Other

## 2017-04-16 ENCOUNTER — Encounter (HOSPITAL_COMMUNITY): Payer: Medicare Other

## 2017-04-19 ENCOUNTER — Encounter (HOSPITAL_COMMUNITY): Payer: Medicare Other

## 2017-04-20 ENCOUNTER — Telehealth: Payer: Self-pay | Admitting: Cardiology

## 2017-04-20 NOTE — Telephone Encounter (Signed)
BP is good off Losartan.  No change in meds at this time

## 2017-04-20 NOTE — Telephone Encounter (Signed)
Pt called to report BP readings:::  6/30    114/87    HR    95  7/1       115/78            69  7/2        126/84            66  7/3        117/82            65  7/4        108/87            63  7/5         108/78            64  7/6          119/84           64

## 2017-04-21 ENCOUNTER — Encounter (HOSPITAL_COMMUNITY): Payer: Medicare Other

## 2017-04-21 NOTE — Telephone Encounter (Signed)
Informed patient her BP is stable off Losartan and to continue current treatment plan. She was grateful for call and agrees to plan.

## 2017-04-23 ENCOUNTER — Encounter (HOSPITAL_COMMUNITY): Payer: Medicare Other

## 2017-04-26 ENCOUNTER — Encounter (HOSPITAL_COMMUNITY): Payer: Medicare Other

## 2017-04-28 ENCOUNTER — Encounter (HOSPITAL_COMMUNITY): Payer: Medicare Other

## 2017-04-30 ENCOUNTER — Encounter (HOSPITAL_COMMUNITY): Payer: Medicare Other

## 2017-05-03 ENCOUNTER — Encounter (HOSPITAL_COMMUNITY): Payer: Medicare Other

## 2017-05-05 ENCOUNTER — Encounter (HOSPITAL_COMMUNITY): Payer: Medicare Other

## 2017-05-07 ENCOUNTER — Encounter (HOSPITAL_COMMUNITY): Payer: Medicare Other

## 2017-05-10 ENCOUNTER — Encounter (HOSPITAL_COMMUNITY): Payer: Medicare Other

## 2017-05-12 ENCOUNTER — Encounter (HOSPITAL_COMMUNITY): Payer: Medicare Other

## 2017-05-14 ENCOUNTER — Encounter (HOSPITAL_COMMUNITY): Payer: Medicare Other

## 2017-05-17 ENCOUNTER — Encounter (HOSPITAL_COMMUNITY): Payer: Medicare Other

## 2017-05-19 ENCOUNTER — Encounter (HOSPITAL_COMMUNITY): Payer: Medicare Other

## 2017-05-21 ENCOUNTER — Encounter (HOSPITAL_COMMUNITY): Payer: Medicare Other

## 2017-05-24 ENCOUNTER — Encounter (HOSPITAL_COMMUNITY): Payer: Medicare Other

## 2017-05-26 ENCOUNTER — Encounter (HOSPITAL_COMMUNITY): Payer: Medicare Other

## 2017-05-28 ENCOUNTER — Encounter (HOSPITAL_COMMUNITY): Payer: Medicare Other

## 2017-05-31 ENCOUNTER — Encounter (HOSPITAL_COMMUNITY): Payer: Medicare Other

## 2017-06-02 ENCOUNTER — Encounter (HOSPITAL_COMMUNITY): Payer: Medicare Other

## 2017-06-04 ENCOUNTER — Encounter (HOSPITAL_COMMUNITY): Payer: Medicare Other

## 2017-06-17 ENCOUNTER — Telehealth: Payer: Self-pay | Admitting: Cardiology

## 2017-06-17 NOTE — Telephone Encounter (Signed)
Pt c/o BP issue: STAT if pt c/o blurred vision, one-sided weakness or slurred speech  1. What are your last 5 BP readings? 149/99, 149/110  2. Are you having any other symptoms (ex. Dizziness, headache, blurred vision, passed out)?Little Dizziness  3. What is your BP issue? The top number is coming down , bottom numbers are not coming down

## 2017-06-17 NOTE — Telephone Encounter (Signed)
Patient calling back and states that she is concerned that her BP is elevated and has not received a call back with instructions. Patient states that her BP has been elevated the past 2 days. She spoke with a triage RN this morning (see note below) and was made aware that the information would be forwarded for Dr. Radford Pax for review and recommendation.   Patient denies having any chest pain, SOB, HA, vision or speech changes, or any other symptoms at this time. Patient states that she does have some occasional mild dizziness. At 6/21 OV visit losartan was d/c'd due to throat congestion. Patient states that her throat congestion has returned and she has mucous build up even being off of the losartan. Carvedilol was increased to 6.25 BID on 6/29.   Patient's main concern is her BP being elevated. Patient is wanting to know if she should restart Losartan. Made patient aware that the information has been sent to Dr. Radford Pax for review and recommendation and that we would contact her as soon as we hear back. Made Dr. Radford Pax aware.

## 2017-06-17 NOTE — Telephone Encounter (Signed)
Returned the call to the patient. She stated that she has been randomly taking her blood pressure because it had been stable in the past. She had recently been taken off of Losartan and had been feeling good. She decided to check her blood pressure last night and it was 140/88. She woke up this morning at rechecked her blood pressure:  7:00 149/99   HR 65 7:30 149/110  HR 77  And hour after her Carvedilol it was 137/97  She denies chest pain or shortness of breath. She did had complaints of mild dizziness but stated that could be because she is getting nervous. She has been advised to not take her bood pressure again because it could be going up now due to her being nervous. She has been advised to try and relax and not worry about her pressures. Will route to the provider for their recommendation.

## 2017-06-18 MED ORDER — LOSARTAN POTASSIUM 25 MG PO TABS: 25.0000 mg | ORAL_TABLET | Freq: Every day | ORAL | 11 refills | Status: DC

## 2017-06-18 NOTE — Telephone Encounter (Signed)
Restart Losartan 25mg  daily and check BP twice daily for a week before meds and call with results

## 2017-06-18 NOTE — Telephone Encounter (Signed)
Left message to call back  

## 2017-06-18 NOTE — Telephone Encounter (Signed)
Patient verbalized she is "a wreck" because she did not get instructions yesterday and simply asked for instructions because she "doesn't want to talk." Instructed patient to RESTART LOSARTAN 25 mg daily. She will check her BP twice daily for a week (including a reading before medications) and she will call with results. She agreed with treatment plan.

## 2017-07-11 ENCOUNTER — Other Ambulatory Visit: Payer: Self-pay | Admitting: Cardiology

## 2017-07-12 ENCOUNTER — Other Ambulatory Visit: Payer: Self-pay | Admitting: Cardiology

## 2017-07-12 NOTE — Telephone Encounter (Signed)
Valerie Margarita, MD  to [facility] Triage     04/09/17 4:24 PM  Note    DBP too high - increase Coreg to 6.25mg  BID and check BP daily for a week and call with results

## 2017-07-30 DIAGNOSIS — I5181 Takotsubo syndrome: Secondary | ICD-10-CM | POA: Diagnosis not present

## 2017-07-30 DIAGNOSIS — Z0001 Encounter for general adult medical examination with abnormal findings: Secondary | ICD-10-CM | POA: Diagnosis not present

## 2017-07-30 DIAGNOSIS — Z79899 Other long term (current) drug therapy: Secondary | ICD-10-CM | POA: Diagnosis not present

## 2017-07-30 DIAGNOSIS — F419 Anxiety disorder, unspecified: Secondary | ICD-10-CM | POA: Diagnosis not present

## 2017-07-30 DIAGNOSIS — I10 Essential (primary) hypertension: Secondary | ICD-10-CM | POA: Diagnosis not present

## 2017-07-30 DIAGNOSIS — Z1389 Encounter for screening for other disorder: Secondary | ICD-10-CM | POA: Diagnosis not present

## 2017-07-30 DIAGNOSIS — I452 Bifascicular block: Secondary | ICD-10-CM | POA: Diagnosis not present

## 2017-07-30 DIAGNOSIS — Z23 Encounter for immunization: Secondary | ICD-10-CM | POA: Diagnosis not present

## 2017-07-30 DIAGNOSIS — N390 Urinary tract infection, site not specified: Secondary | ICD-10-CM | POA: Diagnosis not present

## 2017-07-30 DIAGNOSIS — E7849 Other hyperlipidemia: Secondary | ICD-10-CM | POA: Diagnosis not present

## 2017-08-05 ENCOUNTER — Telehealth: Payer: Self-pay | Admitting: Cardiology

## 2017-08-05 NOTE — Telephone Encounter (Signed)
Received records from [facility] for appointment on 08/19/17 with Dr Percival Spanish.  Records put with Dr Hochrein's schedule for 08/19/17. lp

## 2017-08-18 NOTE — Progress Notes (Signed)
Cardiology Office Note   Date:  08/19/2017   ID:  ASALEE BARRETTE, DOB [birth year], MRN [id number]  PCP:  Josetta Huddle, MD  Cardiologist:   Minus Breeding, MD    Chief Complaint  Patient presents with  . Cardiomyopathy      History of Present Illness: Valerie Nash is a 71 y.o. 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  She was admitted 12/2016 with elevated enzymes with 2D echo showing EF 15-20% (previously recovered to 57% in 2014), also showed grade 2 DD, mild AI/MR, mod TR/PR, PASP 56mmHg (severe pulm HTN). She underwent LHC 12/14/16 showing normal coronary arteries and fairly normal filling pressures including a PA pressure of 30 systolic, and findings c/w pulmonary HTN per report. Her presentation was felt again due to Takotsubo CM. She also had intermittent episodes of short bursts of nonsustained atrial tachycardia and was therefore continued on her BB. She developed an ACE cough and was changed to an ARB.    She is here today for followup.  She had previously seen Dr. Radford Pax.  She did have a problem with her blood pressure going up since she was last seen.  She was restarted on Cozaar.  Since then she says she is done well.  She does some senior exercises 3 or 4 times a week.  She walks daily.  She denies any cardiovascular symptoms. The patient denies any new symptoms such as chest discomfort, neck or arm discomfort. There has been no new shortness of breath, PND or orthopnea. There have been no reported palpitations, presyncope or syncope.  Of note she makes it clear to me that she never really had chest pain or dyspnea she just did not feel right on both occasions when she had her acute events.  He cannot quantify or qualify this further.  Past Medical History:  Diagnosis Date  . Anxiety   . 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 Colon polyps 2004   4 adenomatous polyps  . Depression    . Dyslipidemia   . Hypertension   . IBS (irritable bowel syndrome)   . Insomnia   . NSTEMI (non-ST elevated myocardial infarction) ([place])   . Obesity (BMI 30-39.9)   . PAT (paroxysmal atrial tachycardia) ([place])    a. first noted on tele 12/2016 during admission for Takotsubo.  . Pulmonary hypertension ([place])    a. Dx 12/2016 in setting of Takotsubo cardiomyopathy, EF 10-15%.  . RBBB (right bundle branch block with left anterior fascicular block)   . Sinusitis   . Takotsubo cardiomyopathy    a. in 2014 -> EF improved shortly after to 57%. b. recurrent Takotsubo in 12/2016 with EF down to 10-15%, normal coronaries.    Past Surgical History:  Procedure Laterality Date  . ABDOMINAL HYSTERECTOMY    . BREAST LUMPECTOMY    . CESAREAN SECTION    . SHOULDER SURGERY       Current Outpatient Medications  Medication Sig Dispense Refill  . acetaminophen (TYLENOL) 500 MG tablet Take 1,000 mg by mouth daily as needed for headache (pain).    . carvedilol (COREG) 6.25 MG tablet TAKE 1 TABLET BY MOUTH TWICE DAILY 180 tablet 0  . cholecalciferol (VITAMIN D) 1000 units tablet Take 1,000 Units by mouth daily.    . Coenzyme Q10 (COQ10) 200 MG CAPS Take 200 mg by mouth at bedtime.    . dicyclomine (BENTYL) 10 MG  capsule Take 10 mg by mouth daily as needed for spasms.    Marland Kitchen ezetimibe (ZETIA) 10 MG tablet Take 10 mg by mouth at bedtime.    . fish oil-omega-3 fatty acids 1000 MG capsule Take 1,000 mg by mouth at bedtime.     Marland Kitchen losartan (COZAAR) 25 MG tablet Take 1 tablet (25 mg total) by mouth daily. 30 tablet 11  . Multiple Vitamin (MULTIVITAMIN WITH MINERALS) TABS Take 1 tablet by mouth daily.    . nitroGLYCERIN (NITROSTAT) 0.4 MG SL tablet Place 1 tablet (0.4 mg total) under the tongue every 5 (five) minutes x 3 doses as needed for chest pain. 25 tablet 12  . OVER THE COUNTER MEDICATION Take 1 tablet by mouth daily. Biotin with collagen    . OVER THE COUNTER MEDICATION Take 1 tablet by mouth at bedtime as  needed (sleep). REM - sleep aid from [facility] (melatonin, valerian root, magnesium, etc.)    . venlafaxine XR (EFFEXOR-XR) 75 MG 24 hr capsule Take 75 mg by mouth daily.    . vitamin B-12 (CYANOCOBALAMIN) 1000 MCG tablet Take 1,000 mcg by mouth daily.     No current facility-administered medications for this visit.     Allergies:   Ace inhibitors; Ambien [zolpidem tartrate]; Pravastatin; Simvastatin; and Topamax [topiramate]    ROS:  Please see the history of present illness.   Otherwise, review of systems are positive for none.   All other systems are reviewed and negative.    PHYSICAL EXAM: VS:  BP 130/82 (BP Location: Right Arm, Cuff Size: Normal)   Pulse 62   Ht 5\' 3"  (1.6 m)   Wt 171 lb (77.6 kg)   LMP  (LMP Unknown)   SpO2 97%   BMI 30.29 kg/m  , BMI Body mass index is 30.29 kg/m.  GENERAL:  Well appearing NECK:  No jugular venous distention, waveform within normal limits, carotid upstroke brisk and symmetric, no bruits, no thyromegaly LUNGS:  Clear to auscultation bilaterally CHEST:  Unremarkable HEART:  PMI not displaced or sustained,S1 and S2 within normal limits, no S3, no S4, no clicks, no rubs, no murmurs ABD:  Flat, positive bowel sounds normal in frequency in pitch, no bruits, no rebound, no guarding, no midline pulsatile mass, no hepatomegaly, no splenomegaly EXT:  2 plus pulses throughout, no edema, no cyanosis no clubbing    EKG:  EKG is ordered today. The ekg ordered today demonstrates sinus rhythm, rate 62, left axis deviation, right bundle branch block, no acute ST-T wave changes, no change from previous.   Recent Labs: 12/13/2016: B Natriuretic Peptide 461.5; Magnesium 2.1 12/15/2016: BUN 17; Creatinine, Ser 0.82; Potassium 4.5; Sodium 139 12/16/2016: Hemoglobin 12.6; Platelets 266 12/31/2016: TSH 4.270    Lipid Panel    Component Value Date/Time   CHOL 209 (H) 12/14/2016 0441   TRIG 96 12/14/2016 0441   HDL 55 12/14/2016 0441   CHOLHDL 3.8 12/14/2016  0441   VLDL 19 12/14/2016 0441   LDLCALC 135 (H) 12/14/2016 0441   LDLDIRECT 143.3 08/28/2013 1053      Wt Readings from Last 3 Encounters:  08/19/17 171 lb (77.6 kg)  04/01/17 168 lb (76.2 kg)  04/08/17 168 lb 10.4 oz (76.5 kg)      Other studies Reviewed: Additional studies/ records that were reviewed today include: Office records primary care.  . Review of the above records demonstrates:  Please see elsewhere in the note.     ASSESSMENT AND PLAN:    TAKOTSUBO  CM: Her ejection fraction recovered.  She has no symptoms currently.  There is no known therapy that would necessarily prevent this.  We talked about managing the emotional stress although there was not necessarily a trigger particularly with a second episode.  The first episode might of been physical as she was exercising in a pool.  However, I would not suggest any limitations at this point.  ATRIAL TACH: Her heart rate is well controlled in fact slightly low.  No change in therapy is indicated.  HTN: Blood pressures currently well controlled.  No change in therapy.  DYSLIPIDEMIA: She does have an elevated LDL of 135 with an HDL of 62.  She has no coronary disease.  There is no need to change in therapy.  BIFASICULAR BLOCK: She is a chronic bifascicular block.  She has not had any syncope or episodes related to this.  This point I do not think further imaging is indicated.  She would let me know if she has any future symptoms of lightheadedness or presyncope or syncope.    Current medicines are reviewed at length with the patient today.  The patient does not have concerns regarding medicines.  The following changes have been made:  no change  Labs/ tests ordered today include: none  Orders Placed This Encounter  Procedures  . EKG 12-Lead     Disposition:   FU with me in 4 months.      Signed, Minus Breeding, MD  08/19/2017 1:04 PM    [place]

## 2017-08-19 ENCOUNTER — Encounter: Payer: Self-pay | Admitting: Cardiology

## 2017-08-19 ENCOUNTER — Ambulatory Visit (INDEPENDENT_AMBULATORY_CARE_PROVIDER_SITE_OTHER): Payer: Medicare Other | Admitting: Cardiology

## 2017-08-19 VITALS — BP 130/82 | HR 62 | Ht 63.0 in | Wt 171.0 lb

## 2017-08-19 DIAGNOSIS — E785 Hyperlipidemia, unspecified: Secondary | ICD-10-CM

## 2017-08-19 DIAGNOSIS — R9431 Abnormal electrocardiogram [ECG] [EKG]: Secondary | ICD-10-CM | POA: Diagnosis not present

## 2017-08-19 DIAGNOSIS — I5181 Takotsubo syndrome: Secondary | ICD-10-CM | POA: Diagnosis not present

## 2017-08-19 DIAGNOSIS — I10 Essential (primary) hypertension: Secondary | ICD-10-CM | POA: Diagnosis not present

## 2017-08-19 NOTE — Patient Instructions (Signed)

## 2017-09-14 DIAGNOSIS — Z853 Personal history of malignant neoplasm of breast: Secondary | ICD-10-CM | POA: Diagnosis not present

## 2017-09-14 DIAGNOSIS — Z1231 Encounter for screening mammogram for malignant neoplasm of breast: Secondary | ICD-10-CM | POA: Diagnosis not present

## 2017-10-16 ENCOUNTER — Other Ambulatory Visit: Payer: Self-pay | Admitting: Cardiology

## 2017-11-09 ENCOUNTER — Telehealth: Payer: Self-pay | Admitting: Cardiology

## 2017-11-09 MED ORDER — LOSARTAN POTASSIUM 25 MG PO TABS: 25.0000 mg | ORAL_TABLET | Freq: Two times a day (BID) | ORAL | 3 refills | Status: DC

## 2017-11-09 NOTE — Telephone Encounter (Signed)
Spoke with pt, for the last 3-4 days her bp has been elevated. Yesterday, mid-day, she took an extra carvedilol and losartan. It really did not make a difference. Her pulse is running 70-80's, her normal is in the 60's. She is not having any other problems but a dull headache. Will forward for dr hochrein's review and advise.

## 2017-11-09 NOTE — Telephone Encounter (Signed)
We could go up to 25 bid of the Cozaar.  Keep an eye.  BPs are mildly elevated and we can continue to titrate ARB and possibly beta blocker in the future.

## 2017-11-09 NOTE — Telephone Encounter (Signed)
Spoke with pt, aware of dr hochrein's recommendations.  New script sent to the pharmacy  

## 2017-11-09 NOTE — Telephone Encounter (Signed)
New Message   Pt c/o BP issue:  1. What are your last 5 BP readings? 143/97, 167/94, 161/97 2. Are you having any other symptoms (ex. Dizziness, headache, blurred vision, passed out)? Slight headache 3. What is your medication issue? Patient states that she was advised by Dr. Percival Spanish to contact him with changes concerning her BP. She states that it has been up and down the last 3 days. Please call to discus.Marland Kitchen

## 2017-11-11 ENCOUNTER — Telehealth: Payer: Self-pay | Admitting: Cardiology

## 2017-11-11 NOTE — Telephone Encounter (Signed)
New Message   Pt c/o BP issue:  1. What are your last 5 BP readings? 133/99, 157/95, 138/100 2. Are you having any other symptoms (ex. Dizziness, headache, blurred vision, passed out)? Slight headache that she has had for two days  3. What is your medication issue? Patient called in on 11/09/2017 about her BP. She states that she was told to add losartan 25mg  but it still is not helping her bp. Please call to discuss.

## 2017-11-11 NOTE — Telephone Encounter (Signed)
Spoke with pt, patient has taken the losartan 25 mg twice daily for 2 days and she is concerned about her bp. She continues to have a slight headache. Should she wait a little longer or do we need to make more changes? Will forward to dr hochrein to review and advise.

## 2017-11-11 NOTE — Telephone Encounter (Signed)
Continue current therapy for now

## 2017-11-11 NOTE — Telephone Encounter (Signed)
Do we have BP readings.

## 2017-11-11 NOTE — Telephone Encounter (Signed)
Spoke with pt, aware of dr hochrein's recommendations. ?

## 2017-11-16 ENCOUNTER — Ambulatory Visit (INDEPENDENT_AMBULATORY_CARE_PROVIDER_SITE_OTHER): Payer: Medicare Other | Admitting: Cardiology

## 2017-11-16 ENCOUNTER — Encounter: Payer: Self-pay | Admitting: Cardiology

## 2017-11-16 VITALS — BP 114/82 | HR 80 | Ht 63.0 in | Wt 169.8 lb

## 2017-11-16 DIAGNOSIS — I471 Supraventricular tachycardia: Secondary | ICD-10-CM

## 2017-11-16 DIAGNOSIS — I10 Essential (primary) hypertension: Secondary | ICD-10-CM

## 2017-11-16 NOTE — Patient Instructions (Signed)
Medication Instructions:  Continue current medications  If you need a refill on your cardiac medications before your next appointment, please call your pharmacy.  Labwork: None Ordered   Testing/Procedures: None Ordered  Follow-Up: Your physician wants you to follow-up in: 1 Year. You should receive a reminder letter in the mail two months in advance. If you do not receive a letter, please call our office [phone].    Thank you for choosing [facility]!!      

## 2017-11-16 NOTE — Progress Notes (Signed)
Cardiology Office Note   Date:  11/16/2017   ID:  Valerie Nash, DOB October 28, 1945, MRN [id number]  PCP:  Josetta Huddle, MD  Cardiologist:   Minus Breeding, MD    Chief Complaint  Patient presents with  . Hypertension      History of Present Illness: Valerie Nash is a 72 y.o. 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  She was admitted 12/2016 with elevated enzymes with 2D echo showing EF 15-20% (previously recovered to 57% in 2014), also showed grade 2 DD, mild AI/MR, mod TR/PR, PASP 52mmHg (severe pulm HTN). She underwent LHC 12/14/16 showing normal coronary arteries and fairly normal filling pressures including a PA pressure of 30 systolic, and findings c/w pulmonary HTN per report. Her presentation was felt again due to Takotsubo CM. She also had intermittent episodes of short bursts of nonsustained atrial tachycardia and was therefore continued on her BB. She developed an ACE cough and was changed to an ARB.    She called to be added to the schedule last week as her diastolic BPs were very elevated.  However, today she got this checked her her cuff seems to be wrong.  It is an old cuff.  She otherwise has felt well.  The patient denies any new symptoms such as chest discomfort, neck or arm discomfort. There has been no new shortness of breath, PND or orthopnea. There have been no reported palpitations, presyncope or syncope.   Past Medical History:  Diagnosis Date  . Anxiety   . 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 Colon polyps 2004   4 adenomatous polyps  . Depression   . Dyslipidemia   . Hypertension   . IBS (irritable bowel syndrome)   . Insomnia   . NSTEMI (non-ST elevated myocardial infarction) ([place])   . Obesity (BMI 30-39.9)   . PAT (paroxysmal atrial tachycardia) ([place])    a. first noted on tele 12/2016 during admission for Takotsubo.  . Pulmonary hypertension ([place])    a. Dx  12/2016 in setting of Takotsubo cardiomyopathy, EF 10-15%.  . RBBB (right bundle branch block with left anterior fascicular block)   . Sinusitis   . Takotsubo cardiomyopathy    a. in 2014 -> EF improved shortly after to 57%. b. recurrent Takotsubo in 12/2016 with EF down to 10-15%, normal coronaries.    Past Surgical History:  Procedure Laterality Date  . ABDOMINAL HYSTERECTOMY    . BREAST LUMPECTOMY    . CESAREAN SECTION    . LEFT HEART CATHETERIZATION WITH CORONARY ANGIOGRAM Bilateral 12/26/2012   Procedure: LEFT HEART CATHETERIZATION WITH CORONARY ANGIOGRAM;  Surgeon: Sueanne Margarita, MD;  Location: [facility];  Service: Cardiovascular;  Laterality: Bilateral;  . RIGHT/LEFT HEART CATH AND CORONARY ANGIOGRAPHY N/A 12/14/2016   Procedure: Right/Left Heart Cath and Coronary Angiography;  Surgeon: Lorretta Harp, MD;  Location: [facility] CV [facility];  Service: Cardiovascular;  Laterality: N/A;  . SHOULDER SURGERY       Current Outpatient Medications  Medication Sig Dispense Refill  . acetaminophen (TYLENOL) 500 MG tablet Take 1,000 mg by mouth daily as needed for headache (pain).    . carvedilol (COREG) 6.25 MG tablet TAKE 1 TABLET BY MOUTH TWICE DAILY 180 tablet 1  . cholecalciferol (VITAMIN D) 1000 units tablet Take 1,000 Units by mouth daily.    . Coenzyme Q10 (COQ10) 200 MG CAPS Take 200 mg by mouth  at bedtime.    . dicyclomine (BENTYL) 10 MG capsule Take 10 mg by mouth daily as needed for spasms.    Marland Kitchen ezetimibe (ZETIA) 10 MG tablet Take 10 mg by mouth at bedtime.    . fish oil-omega-3 fatty acids 1000 MG capsule Take 1,000 mg by mouth at bedtime.     Marland Kitchen losartan (COZAAR) 25 MG tablet Take 1 tablet (25 mg total) by mouth 2 (two) times daily. 180 tablet 3  . Multiple Vitamin (MULTIVITAMIN WITH MINERALS) TABS Take 1 tablet by mouth daily.    . nitroGLYCERIN (NITROSTAT) 0.4 MG SL tablet Place 1 tablet (0.4 mg total) under the tongue every 5 (five) minutes x 3 doses as needed for chest pain.  25 tablet 12  . OVER THE COUNTER MEDICATION Take 1 tablet by mouth daily. Biotin with collagen    . OVER THE COUNTER MEDICATION Take 1 tablet by mouth at bedtime as needed (sleep). REM - sleep aid from [employer] (melatonin, valerian root, magnesium, etc.)    . venlafaxine XR (EFFEXOR-XR) 75 MG 24 hr capsule Take 75 mg by mouth daily.    . vitamin B-12 (CYANOCOBALAMIN) 1000 MCG tablet Take 1,000 mcg by mouth daily.     No current facility-administered medications for this visit.     Allergies:   Ace inhibitors; Ambien [zolpidem tartrate]; Pravastatin; Simvastatin; and Topamax [topiramate]    ROS:  Please see the history of present illness.   Otherwise, review of systems are positive for none.   All other systems are reviewed and negative.    PHYSICAL EXAM: VS:  BP 114/82 (BP Location: Left Arm, Patient Position: Sitting, Cuff Size: Normal)   Pulse 80   Ht 5\' 3"  (1.6 m)   Wt 169 lb 12.8 oz (77 kg)   LMP  (LMP Unknown)   SpO2 92%   BMI 30.08 kg/m  , BMI Body mass index is 30.08 kg/m.  GENERAL:  Well appearing NECK:  No jugular venous distention, waveform within normal limits, carotid upstroke brisk and symmetric, no bruits, no thyromegaly LUNGS:  Clear to auscultation bilaterally CHEST:  Unremarkable HEART:  PMI not displaced or sustained,S1 and S2 within normal limits, no S3, no S4, no clicks, no rubs, no murmurs ABD:  Flat, positive bowel sounds normal in frequency in pitch, no bruits, no rebound, no guarding, no midline pulsatile mass, no hepatomegaly, no splenomegaly EXT:  2 plus pulses throughout, no edema, no cyanosis no clubbing    EKG:  EKG is not ordered today.    Recent Labs: 12/13/2016: B Natriuretic Peptide 461.5; Magnesium 2.1 12/15/2016: BUN 17; Creatinine, Ser 0.82; Potassium 4.5; Sodium 139 12/16/2016: Hemoglobin 12.6; Platelets 266 12/31/2016: TSH 4.270    Lipid Panel    Component Value Date/Time   CHOL 209 (H) 12/14/2016 0441   TRIG 96 12/14/2016 0441   HDL  55 12/14/2016 0441   CHOLHDL 3.8 12/14/2016 0441   VLDL 19 12/14/2016 0441   LDLCALC 135 (H) 12/14/2016 0441   LDLDIRECT 143.3 08/28/2013 1053      Wt Readings from Last 3 Encounters:  11/16/17 169 lb 12.8 oz (77 kg)  08/19/17 171 lb (77.6 kg)  04/01/17 168 lb (76.2 kg)      Other studies Reviewed: Additional studies/ records that were reviewed today include: None Review of the above records demonstrates:     ASSESSMENT AND PLAN:    TAKOTSUBO CM: Her ejection fraction recovered.   No change in therapy.   ATRIAL TACH: Her heart  rate is well controlled.  She does not feel palpitations.  No change in therapy.   HTN: Blood pressures currently well controlled.  She will continue meds as listed.  She will get a new cuff.  DYSLIPIDEMIA: She does have an elevated LDL of 135 with an HDL of 62.   No change in therapy.  BIFASICULAR BLOCK: She has chronic bifascicular block but no symptoms related to this and no syncope.  No further evaluation is planned.     Current medicines are reviewed at length with the patient today.  The patient does not have concerns regarding medicines.  The following changes have been made:  no change  Labs/ tests ordered today include: none  No orders of the defined types were placed in this encounter.    Disposition:   FU with me in 6   Signed, Minus Breeding, MD  11/16/2017 5:11 PM    [facility]

## 2018-01-21 DIAGNOSIS — R05 Cough: Secondary | ICD-10-CM | POA: Diagnosis not present

## 2018-01-21 DIAGNOSIS — J324 Chronic pansinusitis: Secondary | ICD-10-CM | POA: Diagnosis not present

## 2018-01-21 DIAGNOSIS — J342 Deviated nasal septum: Secondary | ICD-10-CM | POA: Diagnosis not present

## 2018-01-21 DIAGNOSIS — J310 Chronic rhinitis: Secondary | ICD-10-CM | POA: Diagnosis not present

## 2018-01-23 DIAGNOSIS — J342 Deviated nasal septum: Secondary | ICD-10-CM | POA: Insufficient documentation

## 2018-02-25 DIAGNOSIS — H524 Presbyopia: Secondary | ICD-10-CM | POA: Diagnosis not present

## 2018-02-25 DIAGNOSIS — H52203 Unspecified astigmatism, bilateral: Secondary | ICD-10-CM | POA: Diagnosis not present

## 2018-02-25 DIAGNOSIS — H02834 Dermatochalasis of left upper eyelid: Secondary | ICD-10-CM | POA: Diagnosis not present

## 2018-02-25 DIAGNOSIS — H04123 Dry eye syndrome of bilateral lacrimal glands: Secondary | ICD-10-CM | POA: Diagnosis not present

## 2018-02-25 DIAGNOSIS — H43813 Vitreous degeneration, bilateral: Secondary | ICD-10-CM | POA: Diagnosis not present

## 2018-02-25 DIAGNOSIS — H1859 Other hereditary corneal dystrophies: Secondary | ICD-10-CM | POA: Diagnosis not present

## 2018-02-25 DIAGNOSIS — H02831 Dermatochalasis of right upper eyelid: Secondary | ICD-10-CM | POA: Diagnosis not present

## 2018-02-25 DIAGNOSIS — H353132 Nonexudative age-related macular degeneration, bilateral, intermediate dry stage: Secondary | ICD-10-CM | POA: Diagnosis not present

## 2018-02-25 DIAGNOSIS — Z961 Presence of intraocular lens: Secondary | ICD-10-CM | POA: Diagnosis not present

## 2018-02-25 DIAGNOSIS — H16223 Keratoconjunctivitis sicca, not specified as Sjogren's, bilateral: Secondary | ICD-10-CM | POA: Diagnosis not present

## 2018-02-25 DIAGNOSIS — H5203 Hypermetropia, bilateral: Secondary | ICD-10-CM | POA: Diagnosis not present

## 2018-03-22 DIAGNOSIS — M25522 Pain in left elbow: Secondary | ICD-10-CM | POA: Diagnosis not present

## 2018-03-23 DIAGNOSIS — R05 Cough: Secondary | ICD-10-CM | POA: Diagnosis not present

## 2018-03-23 DIAGNOSIS — J329 Chronic sinusitis, unspecified: Secondary | ICD-10-CM | POA: Diagnosis not present

## 2018-03-23 DIAGNOSIS — J3489 Other specified disorders of nose and nasal sinuses: Secondary | ICD-10-CM | POA: Diagnosis not present

## 2018-03-23 DIAGNOSIS — J343 Hypertrophy of nasal turbinates: Secondary | ICD-10-CM | POA: Diagnosis not present

## 2018-03-23 DIAGNOSIS — J342 Deviated nasal septum: Secondary | ICD-10-CM | POA: Diagnosis not present

## 2018-03-23 DIAGNOSIS — J310 Chronic rhinitis: Secondary | ICD-10-CM | POA: Diagnosis not present

## 2018-04-21 DIAGNOSIS — M7702 Medial epicondylitis, left elbow: Secondary | ICD-10-CM | POA: Diagnosis not present

## 2018-04-28 ENCOUNTER — Other Ambulatory Visit: Payer: Self-pay | Admitting: Cardiology

## 2018-06-02 ENCOUNTER — Other Ambulatory Visit: Payer: Self-pay | Admitting: Cardiology

## 2018-06-14 ENCOUNTER — Other Ambulatory Visit: Payer: Self-pay | Admitting: Cardiology

## 2018-06-20 ENCOUNTER — Other Ambulatory Visit: Payer: Self-pay | Admitting: Cardiology

## 2018-06-20 DIAGNOSIS — M7702 Medial epicondylitis, left elbow: Secondary | ICD-10-CM | POA: Diagnosis not present

## 2018-07-18 DIAGNOSIS — Z23 Encounter for immunization: Secondary | ICD-10-CM | POA: Diagnosis not present

## 2018-07-26 DIAGNOSIS — J343 Hypertrophy of nasal turbinates: Secondary | ICD-10-CM | POA: Diagnosis not present

## 2018-07-26 DIAGNOSIS — J342 Deviated nasal septum: Secondary | ICD-10-CM | POA: Diagnosis not present

## 2018-07-26 DIAGNOSIS — J310 Chronic rhinitis: Secondary | ICD-10-CM | POA: Diagnosis not present

## 2018-07-29 ENCOUNTER — Other Ambulatory Visit: Payer: Self-pay | Admitting: Otolaryngology

## 2018-08-08 DIAGNOSIS — N390 Urinary tract infection, site not specified: Secondary | ICD-10-CM | POA: Diagnosis not present

## 2018-08-08 DIAGNOSIS — J342 Deviated nasal septum: Secondary | ICD-10-CM | POA: Diagnosis not present

## 2018-08-08 DIAGNOSIS — Z0001 Encounter for general adult medical examination with abnormal findings: Secondary | ICD-10-CM | POA: Diagnosis not present

## 2018-08-08 DIAGNOSIS — F419 Anxiety disorder, unspecified: Secondary | ICD-10-CM | POA: Diagnosis not present

## 2018-08-08 DIAGNOSIS — Z23 Encounter for immunization: Secondary | ICD-10-CM | POA: Diagnosis not present

## 2018-08-08 DIAGNOSIS — I452 Bifascicular block: Secondary | ICD-10-CM | POA: Diagnosis not present

## 2018-08-08 DIAGNOSIS — E785 Hyperlipidemia, unspecified: Secondary | ICD-10-CM | POA: Diagnosis not present

## 2018-08-08 DIAGNOSIS — Z1389 Encounter for screening for other disorder: Secondary | ICD-10-CM | POA: Diagnosis not present

## 2018-08-08 DIAGNOSIS — Z79899 Other long term (current) drug therapy: Secondary | ICD-10-CM | POA: Diagnosis not present

## 2018-08-08 DIAGNOSIS — I10 Essential (primary) hypertension: Secondary | ICD-10-CM | POA: Diagnosis not present

## 2018-08-08 DIAGNOSIS — I5181 Takotsubo syndrome: Secondary | ICD-10-CM | POA: Diagnosis not present

## 2018-08-16 ENCOUNTER — Other Ambulatory Visit: Payer: Self-pay

## 2018-08-16 ENCOUNTER — Encounter (HOSPITAL_COMMUNITY): Payer: Self-pay

## 2018-08-16 ENCOUNTER — Encounter (HOSPITAL_COMMUNITY)
Admission: RE | Admit: 2018-08-16 | Discharge: 2018-08-16 | Disposition: A | Discharge status: Home / Self Care | Payer: Medicare Other | Source: Ambulatory Visit | Attending: Otolaryngology | Admitting: Otolaryngology

## 2018-08-16 DIAGNOSIS — Z01818 Encounter for other preprocedural examination: Secondary | ICD-10-CM | POA: Diagnosis not present

## 2018-08-16 DIAGNOSIS — J342 Deviated nasal septum: Secondary | ICD-10-CM | POA: Insufficient documentation

## 2018-08-16 DIAGNOSIS — J343 Hypertrophy of nasal turbinates: Secondary | ICD-10-CM | POA: Diagnosis not present

## 2018-08-16 LAB — BASIC METABOLIC PANEL
Anion gap: 7 (ref 5–15)
BUN: 10 mg/dL (ref 8–23)
CO2: 28 mmol/L (ref 22–32)
Calcium: 9.2 mg/dL (ref 8.9–10.3)
Chloride: 104 mmol/L (ref 98–111)
Creatinine, Ser: 0.96 mg/dL (ref 0.44–1.00)
GFR calc Af Amer: >60 mL/min (ref >60)
GFR calc non Af Amer: 58 mL/min — ABNORMAL LOW (ref >60)
Glucose, Bld: 95 mg/dL (ref 70–99)
Potassium: 3.5 mmol/L (ref 3.5–5.1)
Sodium: 139 mmol/L (ref 135–145)

## 2018-08-16 LAB — CBC
HCT: 44.2 % (ref 36.0–46.0)
Hemoglobin: 13.7 g/dL (ref 12.0–15.0)
MCH: 27.3 pg (ref 26.0–34.0)
MCHC: 31 g/dL (ref 30.0–36.0)
MCV: 88 fL (ref 80.0–100.0)
Platelets: 297 10*3/uL (ref 150–400)
RBC: 5.02 MIL/uL (ref 3.87–5.11)
RDW: 12.9 % (ref 11.5–15.5)
WBC: 12.4 10*3/uL — ABNORMAL HIGH (ref 4.0–10.5)
nRBC: 0 % (ref 0.0–0.2)

## 2018-08-16 NOTE — Pre-Procedure Instructions (Signed)
Winna Golla Nabor  08/16/2018      [id number] [address], [place] [address] AT [facility] [address] & [address] [place] [address] Phone: [phone] Fax: [phone]    Your procedure is scheduled on Wednesday, November 13th.  Report to [facility] Admitting at 9:00 A.M.  Call this number if you have problems the morning of surgery:  [phone]   Remember:  Do not eat or drink after midnight.    Take these medicines the morning of surgery with A SIP OF WATER  carvedilol (COREG) venlafaxine XR (EFFEXOR-XR)  acetaminophen (TYLENOL) -as needed Polyethyl Glycol-Propyl Glycol (LUBRICANT EYE DROPS)-as needed nitroGLYCERIN (NITROSTAT) -if needed; please let nurse know if you had to use this.   7 days prior to surgery STOP taking any Aspirin(unless otherwise instructed by your surgeon), Aleve, Naproxen, Ibuprofen, Motrin, Advil, Goody's, BC's, all herbal medications, fish oil, and all vitamins     Do not wear jewelry, make-up or nail polish.  Do not wear lotions, powders, or perfumes, or deodorant.  Do not shave 48 hours prior to surgery.    Do not bring valuables to the hospital.  [facility] is not responsible for any belongings or valuables.  Eyeglasses, contacts, hearing aids, dentures or bridgework may not be worn into surgery.  Leave your suitcase in the car.  After surgery it may be brought to your room.  For patients admitted to the hospital, discharge time will be determined by your treatment team.  Patients discharged the day of surgery will not be allowed to drive home.   Special instructions:   [facility]- Preparing For Surgery  Before surgery, you can play an important role. Because skin is not sterile, your skin needs to be as free of germs as possible. You can reduce the number of germs on your skin by washing with CHG (chlorahexidine gluconate) Soap before surgery.  CHG is an antiseptic cleaner which kills  germs and bonds with the skin to continue killing germs even after washing.    Oral Hygiene is also important to reduce your risk of infection.  Remember - BRUSH YOUR TEETH THE MORNING OF SURGERY WITH YOUR REGULAR TOOTHPASTE  Please do not use if you have an allergy to CHG or antibacterial soaps. If your skin becomes reddened/irritated stop using the CHG.  Do not shave (including legs and underarms) for at least 48 hours prior to first CHG shower. It is OK to shave your face.  Please follow these instructions carefully.   1. Shower the NIGHT BEFORE SURGERY and the MORNING OF SURGERY with CHG.   2. If you chose to wash your hair, wash your hair first as usual with your normal shampoo.  3. After you shampoo, rinse your hair and body thoroughly to remove the shampoo.  4. Use CHG as you would any other liquid soap. You can apply CHG directly to the skin and wash gently with a scrungie or a clean washcloth.   5. Apply the CHG Soap to your body ONLY FROM THE NECK DOWN.  Do not use on open wounds or open sores. Avoid contact with your eyes, ears, mouth and genitals (private parts). Wash Face and genitals (private parts)  with your normal soap.  6. Wash thoroughly, paying special attention to the area where your surgery will be performed.  7. Thoroughly rinse your body with warm water from the neck down.  8. DO NOT shower/wash  with your normal soap after using and rinsing off the CHG Soap.  9. Pat yourself dry with a CLEAN TOWEL.  10. Wear CLEAN PAJAMAS to bed the night before surgery, wear comfortable clothes the morning of surgery  11. Place CLEAN SHEETS on your bed the night of your first shower and DO NOT SLEEP WITH PETS.    Day of Surgery:  Do not apply any deodorants/lotions.  Please wear clean clothes to the hospital/surgery center.   Remember to brush your teeth WITH YOUR REGULAR TOOTHPASTE.   Please read over the following fact sheets that you were given.

## 2018-08-16 NOTE — Progress Notes (Signed)
PCP - Dr. Josetta Huddle Cardiologist - Dr. Minus Breeding   Chest x-ray - N/A EKG - 08/19/17 Stress Test - denies ECHO - 01/21/17 Cardiac Cath - 12/14/16  Sleep Study - denies  Aspirin Instructions: N/A  Anesthesia review: Yes, cardiac history   Patient denies shortness of breath, fever, cough and chest pain at PAT appointment   Patient verbalized understanding of instructions that were given to them at the PAT appointment. Patient was also instructed that they will need to review over the PAT instructions again at home before surgery.

## 2018-08-17 NOTE — Progress Notes (Signed)
Anesthesia Chart Review:  Case:  [id number] Date/Time:  08/24/18 1045   Procedure:  NASAL SEPTOPLASTY WITH TURBINATE REDUCTION (Bilateral )   Anesthesia type:  General   Pre-op diagnosis:  Deviated nasal septum, nasal turbinate hypertrophy   Location:  [facility] OR ROOM 09 / [place] [facility]   Surgeon:  Jerrell Belfast, MD      DISCUSSION: 72 yo female former smoker. 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 She wasadmitted 12/2016 with elevated enzymes with 2D echo showing EF 15-20% (previously recovered to 57% in 2014), also showed grade 2 DD, mild AI/MR, mod TR/PR, PASP 41mmHg (severe pulm HTN). She underwent LHC 12/14/16 showing normal coronary arteries and fairly normal filling pressures including a PA pressure of 30 systolic, and findings c/w pulmonary HTN per report. Her presentation was felt again due to Takotsubo CM. She also had intermittent episodes of short bursts of nonsustained atrial tachycardia and was therefore continued on her BB.Cardiac function again fully recovered with echo 01/21/2017 showing resolution of DCM and EF now 60-65% with mildly reduced RVF.    Last seen by Dr. Percival Spanish 11/16/2017. At that time she was doing well. Per his note "The patient denies any new symptoms such as chest discomfort, neck or arm discomfort. There has been no new shortness of breath, PND or orthopnea. There have been no reported palpitations, presyncope or syncope." His recommendation was to continue medical therapy.  Anticipate she can proceed as planned barring acute status change.  VS: BP (!) 151/80   Pulse 79   Temp 36.8 C   Resp 18   Ht 5' 3.75" (1.619 m)   Wt 74.7 kg   LMP  (LMP Unknown)   SpO2 97%   BMI 28.48 kg/m   PROVIDERS: Josetta Huddle, MD is PCP  Minus Breeding, MD is Cardiologist  LABS: Labs reviewed: Acceptable for surgery. (all labs ordered are listed, but only abnormal results are displayed)  Labs  Reviewed  BASIC METABOLIC PANEL - Abnormal; Notable for the following components:      Result Value   GFR calc non Af Amer 58 (*)    All other components within normal limits  CBC - Abnormal; Notable for the following components:   WBC 12.4 (*)    All other components within normal limits     IMAGES: CTA Chest 12/14/2016: IMPRESSION: No definite evidence of pulmonary embolus. No acute abnormality seen in the chest.  EKG: 08/19/2017: NSR. Rate 62. RBBB. LAFB.  CV: TTE 01/21/2017: Study Conclusions  - Left ventricle: The cavity size was normal. Wall thickness was   normal. Systolic function was normal. The estimated ejection   fraction was in the range of 60% to 65%. Normal GLPSS at -22%.   Wall motion was normal; there were no regional wall motion   abnormalities. Left ventricular diastolic function parameters   were normal. - Mitral valve: Mildly thickened leaflets . There was trivial   regurgitation. - Left atrium: The atrium was normal in size. - Right ventricle: The cavity size was normal. Systolic function is   mildly reduced. - Right atrium: The atrium was normal in size. - Tricuspid valve: There was trivial regurgitation. - Pulmonary arteries: PA peak pressure: 25 mm Hg (S). - Inferior vena cava: The vessel was normal in size. The   respirophasic diameter changes were in the normal range (= 50%),   consistent with normal central venous pressure.  Impressions:  - Compared with the prior echo on 12/13/2016,  the LVEF has improved   from 15-20% up to 60-65% with normal diastolic parameters.  Cath 12/14/2016:  IMPRESSION:Valerie Nash as normal coronary arteries and fairly normal filling pressures including a PA pressure of 30 systolic. 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 I performed a right femoral angiogram and then deployed a [facility] closure device successfully achieving hemostasis. The patient left the lab in stable condition. Continued medical  therapy will be recommended.   Past Medical History:  Diagnosis Date  . Anxiety   . 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 Colon polyps 2004   4 adenomatous polyps  . Depression   . Dyslipidemia   . Hypertension   . IBS (irritable bowel syndrome)   . Insomnia   . NSTEMI (non-ST elevated myocardial infarction) ([place])   . Obesity (BMI 30-39.9)   . PAT (paroxysmal atrial tachycardia) ([place])    a. first noted on tele 12/2016 during admission for Takotsubo.  . Pulmonary hypertension ([place])    a. Dx 12/2016 in setting of Takotsubo cardiomyopathy, EF 10-15%.  . RBBB (right bundle branch block with left anterior fascicular block)   . Sinusitis   . Takotsubo cardiomyopathy    a. in 2014 -> EF improved shortly after to 57%. b. recurrent Takotsubo in 12/2016 with EF down to 10-15%, normal coronaries.    Past Surgical History:  Procedure Laterality Date  . ABDOMINAL HYSTERECTOMY    . BREAST LUMPECTOMY    . CESAREAN SECTION    . LEFT HEART CATHETERIZATION WITH CORONARY ANGIOGRAM Bilateral 12/26/2012   Procedure: LEFT HEART CATHETERIZATION WITH CORONARY ANGIOGRAM;  Surgeon: Sueanne Margarita, MD;  Location: [place] CATH LAB;  Service: Cardiovascular;  Laterality: Bilateral;  . RIGHT/LEFT HEART CATH AND CORONARY ANGIOGRAPHY N/A 12/14/2016   Procedure: Right/Left Heart Cath and Coronary Angiography;  Surgeon: Lorretta Harp, MD;  Location: [place] CV LAB;  Service: Cardiovascular;  Laterality: N/A;  . SHOULDER SURGERY      MEDICATIONS: . acetaminophen (TYLENOL) 500 MG tablet  . carvedilol (COREG) 6.25 MG tablet  . Cholecalciferol (VITAMIN D-3) 5000 units TABS  . Coenzyme Q10 (COQ10 PO)  . dicyclomine (BENTYL) 10 MG capsule  . ezetimibe (ZETIA) 10 MG tablet  . losartan (COZAAR) 25 MG tablet  . Multiple Vitamin (MULTIVITAMIN WITH MINERALS) TABS  . Multiple Vitamins-Minerals (PRESERVISION AREDS 2 PO)  . nitroGLYCERIN (NITROSTAT) 0.4 MG SL tablet  . Omega-3 Fatty Acids (FISH OIL) 1200 MG CAPS  .  Polyethyl Glycol-Propyl Glycol (LUBRICANT EYE DROPS) 0.4-0.3 % SOLN  . venlafaxine XR (EFFEXOR-XR) 75 MG 24 hr capsule   No current facility-administered medications for this encounter.     Wynonia Musty [facility] Short Stay Center/Anesthesiology Phone [phone] 08/17/2018 3:38 PM

## 2018-08-24 ENCOUNTER — Ambulatory Visit (HOSPITAL_COMMUNITY)
Admission: RE | Admit: 2018-08-24 | Discharge: 2018-08-24 | Disposition: A | Discharge status: Home / Self Care | Payer: Medicare Other | Source: Ambulatory Visit | Attending: Otolaryngology | Admitting: Otolaryngology

## 2018-08-24 ENCOUNTER — Ambulatory Visit (HOSPITAL_COMMUNITY): Payer: Medicare Other | Admitting: Certified Registered Nurse Anesthetist

## 2018-08-24 ENCOUNTER — Ambulatory Visit (HOSPITAL_COMMUNITY): Payer: Medicare Other | Admitting: Physician Assistant

## 2018-08-24 ENCOUNTER — Encounter (HOSPITAL_COMMUNITY)
Admission: RE | Disposition: A | Discharge status: Home / Self Care | Payer: Self-pay | Source: Ambulatory Visit | Attending: Otolaryngology

## 2018-08-24 ENCOUNTER — Encounter (HOSPITAL_COMMUNITY): Payer: Self-pay

## 2018-08-24 DIAGNOSIS — K589 Irritable bowel syndrome without diarrhea: Secondary | ICD-10-CM | POA: Insufficient documentation

## 2018-08-24 DIAGNOSIS — J342 Deviated nasal septum: Secondary | ICD-10-CM | POA: Diagnosis not present

## 2018-08-24 DIAGNOSIS — Z853 Personal history of malignant neoplasm of breast: Secondary | ICD-10-CM | POA: Diagnosis not present

## 2018-08-24 DIAGNOSIS — I252 Old myocardial infarction: Secondary | ICD-10-CM | POA: Insufficient documentation

## 2018-08-24 DIAGNOSIS — J3489 Other specified disorders of nose and nasal sinuses: Secondary | ICD-10-CM | POA: Diagnosis not present

## 2018-08-24 DIAGNOSIS — I509 Heart failure, unspecified: Secondary | ICD-10-CM | POA: Diagnosis not present

## 2018-08-24 DIAGNOSIS — Z79899 Other long term (current) drug therapy: Secondary | ICD-10-CM | POA: Diagnosis not present

## 2018-08-24 DIAGNOSIS — F419 Anxiety disorder, unspecified: Secondary | ICD-10-CM | POA: Diagnosis not present

## 2018-08-24 DIAGNOSIS — I110 Hypertensive heart disease with heart failure: Secondary | ICD-10-CM | POA: Diagnosis not present

## 2018-08-24 DIAGNOSIS — J343 Hypertrophy of nasal turbinates: Secondary | ICD-10-CM | POA: Insufficient documentation

## 2018-08-24 DIAGNOSIS — E785 Hyperlipidemia, unspecified: Secondary | ICD-10-CM | POA: Diagnosis not present

## 2018-08-24 DIAGNOSIS — Z87891 Personal history of nicotine dependence: Secondary | ICD-10-CM | POA: Insufficient documentation

## 2018-08-24 DIAGNOSIS — F329 Major depressive disorder, single episode, unspecified: Secondary | ICD-10-CM | POA: Insufficient documentation

## 2018-08-24 HISTORY — PX: NASAL SEPTOPLASTY W/ TURBINOPLASTY: SHX2070

## 2018-08-24 SURGERY — SEPTOPLASTY, NOSE, WITH NASAL TURBINATE REDUCTION
Anesthesia: General | Site: Nose | Laterality: Bilateral

## 2018-08-24 MED ORDER — OXYMETAZOLINE HCL 0.05 % NA SOLN
1.0000 | Freq: Two times a day (BID) | NASAL | Status: DC
  Administered 2018-08-24: 2 via NASAL

## 2018-08-24 MED ORDER — FENTANYL CITRATE (PF) 250 MCG/5ML IJ SOLN
INTRAMUSCULAR | Status: AC
  Filled 2018-08-24: qty 5

## 2018-08-24 MED ORDER — OXYMETAZOLINE HCL 0.05 % NA SOLN
NASAL | Status: AC
  Filled 2018-08-24: qty 15

## 2018-08-24 MED ORDER — OXYCODONE HCL 5 MG PO TABS: 5.0000 mg | ORAL_TABLET | Freq: Once | ORAL | Status: DC | PRN

## 2018-08-24 MED ORDER — CHLORHEXIDINE GLUCONATE CLOTH 2 % EX PADS: 6.0000 | MEDICATED_PAD | Freq: Once | CUTANEOUS | Status: DC

## 2018-08-24 MED ORDER — DEXAMETHASONE SODIUM PHOSPHATE 10 MG/ML IJ SOLN
INTRAMUSCULAR | Status: DC | PRN
  Administered 2018-08-24: 10 mg via INTRAVENOUS

## 2018-08-24 MED ORDER — FENTANYL CITRATE (PF) 100 MCG/2ML IJ SOLN: 25.0000 ug | INTRAMUSCULAR | Status: DC | PRN

## 2018-08-24 MED ORDER — ONDANSETRON HCL 4 MG/2ML IJ SOLN: 4.0000 mg | Freq: Once | INTRAMUSCULAR | Status: DC | PRN

## 2018-08-24 MED ORDER — DEXAMETHASONE SODIUM PHOSPHATE 10 MG/ML IJ SOLN: 10.0000 mg | Freq: Once | INTRAMUSCULAR | Status: DC

## 2018-08-24 MED ORDER — HYDROCODONE-ACETAMINOPHEN 5-325 MG PO TABS: 1.0000 | ORAL_TABLET | Freq: Four times a day (QID) | ORAL | 0 refills | Status: AC | PRN

## 2018-08-24 MED ORDER — PROPOFOL 10 MG/ML IV BOLUS
INTRAVENOUS | Status: DC | PRN
  Administered 2018-08-24: 100 mg via INTRAVENOUS

## 2018-08-24 MED ORDER — MUPIROCIN CALCIUM 2 % EX CREA
TOPICAL_CREAM | CUTANEOUS | Status: AC
  Filled 2018-08-24: qty 15

## 2018-08-24 MED ORDER — MIDAZOLAM HCL 2 MG/2ML IJ SOLN
INTRAMUSCULAR | Status: DC | PRN
  Administered 2018-08-24: 2 mg via INTRAVENOUS

## 2018-08-24 MED ORDER — LACTATED RINGERS IV SOLN
INTRAVENOUS | Status: DC | PRN
  Administered 2018-08-24: 11:00:00 via INTRAVENOUS

## 2018-08-24 MED ORDER — LIDOCAINE 2% (20 MG/ML) 5 ML SYRINGE
INTRAMUSCULAR | Status: DC | PRN
  Administered 2018-08-24: 100 mg via INTRAVENOUS

## 2018-08-24 MED ORDER — ROCURONIUM BROMIDE 50 MG/5ML IV SOSY
PREFILLED_SYRINGE | INTRAVENOUS | Status: AC
  Filled 2018-08-24: qty 5

## 2018-08-24 MED ORDER — DEXAMETHASONE SODIUM PHOSPHATE 10 MG/ML IJ SOLN
INTRAMUSCULAR | Status: AC
  Filled 2018-08-24: qty 1

## 2018-08-24 MED ORDER — EPHEDRINE SULFATE 50 MG/ML IJ SOLN
INTRAMUSCULAR | Status: DC | PRN
  Administered 2018-08-24 (×2): 5 mg via INTRAVENOUS

## 2018-08-24 MED ORDER — LIDOCAINE-EPINEPHRINE 1 %-1:100000 IJ SOLN
INTRAMUSCULAR | Status: AC
  Filled 2018-08-24: qty 1

## 2018-08-24 MED ORDER — LIDOCAINE 2% (20 MG/ML) 5 ML SYRINGE
INTRAMUSCULAR | Status: AC
  Filled 2018-08-24: qty 5

## 2018-08-24 MED ORDER — ROCURONIUM BROMIDE 50 MG/5ML IV SOSY
PREFILLED_SYRINGE | INTRAVENOUS | Status: DC | PRN
  Administered 2018-08-24: 40 mg via INTRAVENOUS

## 2018-08-24 MED ORDER — ONDANSETRON HCL 4 MG/2ML IJ SOLN
INTRAMUSCULAR | Status: DC | PRN
  Administered 2018-08-24: 4 mg via INTRAVENOUS

## 2018-08-24 MED ORDER — CEFAZOLIN SODIUM-DEXTROSE 2-4 GM/100ML-% IV SOLN
2.0000 g | INTRAVENOUS | Status: AC
  Administered 2018-08-24: 2 g via INTRAVENOUS

## 2018-08-24 MED ORDER — OXYMETAZOLINE HCL 0.05 % NA SOLN
NASAL | Status: DC | PRN
  Administered 2018-08-24: 1 via TOPICAL

## 2018-08-24 MED ORDER — 0.9 % SODIUM CHLORIDE (POUR BTL) OPTIME
TOPICAL | Status: DC | PRN
  Administered 2018-08-24: 1000 mL

## 2018-08-24 MED ORDER — MUPIROCIN 2 % EX OINT
TOPICAL_OINTMENT | CUTANEOUS | Status: DC | PRN
  Administered 2018-08-24: 1 via NASAL

## 2018-08-24 MED ORDER — OXYCODONE HCL 5 MG/5ML PO SOLN: 5.0000 mg | Freq: Once | ORAL | Status: DC | PRN

## 2018-08-24 MED ORDER — ONDANSETRON HCL 4 MG/2ML IJ SOLN
INTRAMUSCULAR | Status: AC
  Filled 2018-08-24: qty 2

## 2018-08-24 MED ORDER — SUCCINYLCHOLINE CHLORIDE 200 MG/10ML IV SOSY
PREFILLED_SYRINGE | INTRAVENOUS | Status: AC
  Filled 2018-08-24: qty 10

## 2018-08-24 MED ORDER — FENTANYL CITRATE (PF) 250 MCG/5ML IJ SOLN
INTRAMUSCULAR | Status: DC | PRN
  Administered 2018-08-24: 100 ug via INTRAVENOUS

## 2018-08-24 MED ORDER — MIDAZOLAM HCL 2 MG/2ML IJ SOLN
INTRAMUSCULAR | Status: AC
  Filled 2018-08-24: qty 2

## 2018-08-24 MED ORDER — CEPHALEXIN 500 MG PO CAPS: 500.0000 mg | ORAL_CAPSULE | Freq: Three times a day (TID) | ORAL | 1 refills | Status: DC

## 2018-08-24 MED ORDER — CEFAZOLIN SODIUM-DEXTROSE 2-4 GM/100ML-% IV SOLN
INTRAVENOUS | Status: AC
  Filled 2018-08-24: qty 100

## 2018-08-24 MED ORDER — LIDOCAINE-EPINEPHRINE 1 %-1:100000 IJ SOLN
INTRAMUSCULAR | Status: DC | PRN
  Administered 2018-08-24: 6 mL

## 2018-08-24 MED ORDER — SUGAMMADEX SODIUM 200 MG/2ML IV SOLN
INTRAVENOUS | Status: DC | PRN
  Administered 2018-08-24: 149.4 mg via INTRAVENOUS

## 2018-08-24 SURGICAL SUPPLY — 26 items
BLADE SURG 15 STRL LF DISP TIS (BLADE) ×1 IMPLANT
BLADE SURG 15 STRL SS (BLADE) ×2
CANISTER SUCT 3000ML PPV (MISCELLANEOUS) ×2 IMPLANT
COAGULATOR SUCT 8FR VV (MISCELLANEOUS) IMPLANT
COVER WAND RF STERILE (DRAPES) ×2 IMPLANT
DRAPE HALF SHEET 40X57 (DRAPES) IMPLANT
ELECT REM PT RETURN 9FT ADLT (ELECTROSURGICAL)
ELECTRODE REM PT RTRN 9FT ADLT (ELECTROSURGICAL) IMPLANT
GAUZE SPONGE 2X2 8PLY STRL LF (GAUZE/BANDAGES/DRESSINGS) ×1 IMPLANT
GLOVE BIOGEL M 7.0 STRL (GLOVE) ×4 IMPLANT
GOWN STRL REUS W/ TWL LRG LVL3 (GOWN DISPOSABLE) ×2 IMPLANT
GOWN STRL REUS W/TWL LRG LVL3 (GOWN DISPOSABLE) ×4
KIT BASIN OR (CUSTOM PROCEDURE TRAY) ×2 IMPLANT
KIT TURNOVER KIT B (KITS) ×2 IMPLANT
NEEDLE HYPO 25GX1X1/2 BEV (NEEDLE) ×2 IMPLANT
NS IRRIG 1000ML POUR BTL (IV SOLUTION) ×2 IMPLANT
PAD ARMBOARD 7.5X6 YLW CONV (MISCELLANEOUS) ×2 IMPLANT
SPLINT NASAL DOYLE BI-VL (GAUZE/BANDAGES/DRESSINGS) ×2 IMPLANT
SPONGE GAUZE 2X2 STER 10/PKG (GAUZE/BANDAGES/DRESSINGS) ×1
SPONGE NEURO XRAY DETECT 1X3 (DISPOSABLE) ×2 IMPLANT
SUT ETHILON 3 0 PS 1 (SUTURE) ×2 IMPLANT
SUT PLAIN 4 0 ~~LOC~~ 1 (SUTURE) ×2 IMPLANT
TOWEL OR 17X24 6PK STRL BLUE (TOWEL DISPOSABLE) ×2 IMPLANT
TRAY ENT MC OR (CUSTOM PROCEDURE TRAY) ×2 IMPLANT
TUBE SALEM SUMP 16 FR W/ARV (TUBING) ×2 IMPLANT
TUBING EXTENTION W/L.L. (IV SETS) ×2 IMPLANT

## 2018-08-24 NOTE — H&P (Signed)
Valerie Nash is an 72 y.o. female.   Chief Complaint: Deviated Septum HPI: Hx of prog nasal obstruction  Past Medical History:  Diagnosis Date  . Anxiety   . 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 Colon polyps 2004   4 adenomatous polyps  . Depression   . Dyslipidemia   . Hypertension   . IBS (irritable bowel syndrome)   . Insomnia   . NSTEMI (non-ST elevated myocardial infarction) ([place])   . Obesity (BMI 30-39.9)   . PAT (paroxysmal atrial tachycardia) ([place])    a. first noted on tele 12/2016 during admission for Takotsubo.  . Pulmonary hypertension ([place])    a. Dx 12/2016 in setting of Takotsubo cardiomyopathy, EF 10-15%.  . RBBB (right bundle branch block with left anterior fascicular block)   . Sinusitis   . Takotsubo cardiomyopathy    a. in 2014 -> EF improved shortly after to 57%. b. recurrent Takotsubo in 12/2016 with EF down to 10-15%, normal coronaries.    Past Surgical History:  Procedure Laterality Date  . ABDOMINAL HYSTERECTOMY    . BREAST LUMPECTOMY    . CESAREAN SECTION    . LEFT HEART CATHETERIZATION WITH CORONARY ANGIOGRAM Bilateral 12/26/2012   Procedure: LEFT HEART CATHETERIZATION WITH CORONARY ANGIOGRAM;  Surgeon: Sueanne Margarita, MD;  Location: [place] CATH LAB;  Service: Cardiovascular;  Laterality: Bilateral;  . RIGHT/LEFT HEART CATH AND CORONARY ANGIOGRAPHY N/A 12/14/2016   Procedure: Right/Left Heart Cath and Coronary Angiography;  Surgeon: Lorretta Harp, MD;  Location: [place] CV LAB;  Service: Cardiovascular;  Laterality: N/A;  . SHOULDER SURGERY      Family History  Problem Relation Age of Onset  . Congestive Heart Failure Mother   . Colon cancer Father 27  . Breast cancer Sister 58  . Colon polyps Brother        2 total  . Lung cancer Maternal Uncle        3ppd smoker  . Congestive Heart Failure Maternal Grandmother   . Lung cancer Maternal Grandfather   . Cervical cancer Daughter 59   Social History:  reports that she quit smoking about 23 years  ago. Her smoking use included cigarettes. She smoked 0.00 packs per day for 29.00 years. She has never used smokeless tobacco. She reports that she drinks alcohol. She reports that she does not use drugs.  Allergies:  Allergies  Allergen Reactions  . Ace Inhibitors Cough  . Pravastatin Other (See Comments)    Muscle aches   . Ambien [Zolpidem Tartrate] Other (See Comments)    Nighttime overeating  . Topamax [Topiramate] Swelling    Tongue swelling  . Simvastatin Other (See Comments)    Memory impairment    Medications Prior to Admission  Medication Sig Dispense Refill  . carvedilol (COREG) 6.25 MG tablet TAKE ONE TABLET BY MOUTH TWICE DAILY (Patient taking differently: Take 6.25 mg by mouth 2 (two) times daily. ) 180 tablet 3  . Cholecalciferol (VITAMIN D-3) 5000 units TABS Take 5,000 Units by mouth daily.    . Coenzyme Q10 (COQ10 PO) Take 1 capsule by mouth every evening.    . ezetimibe (ZETIA) 10 MG tablet Take 10 mg by mouth at bedtime.    Marland Kitchen losartan (COZAAR) 25 MG tablet Take 1 tablet (25 mg total) by mouth 2 (two) times daily. 180 tablet 3  . nitroGLYCERIN (NITROSTAT) 0.4 MG SL tablet Place 1 tablet (0.4 mg total) under the tongue every 5 (five) minutes x 3  doses as needed for chest pain. 25 tablet 12  . Omega-3 Fatty Acids (FISH OIL) 1200 MG CAPS Take 1,200 mg by mouth at bedtime.    Vladimir Faster Glycol-Propyl Glycol (LUBRICANT EYE DROPS) 0.4-0.3 % SOLN Place 1 drop into both eyes 3 (three) times daily as needed (for dry/irritated eyes.).    Marland Kitchen venlafaxine XR (EFFEXOR-XR) 75 MG 24 hr capsule Take 75 mg by mouth daily.    Marland Kitchen acetaminophen (TYLENOL) 500 MG tablet Take 1,000 mg by mouth daily as needed for headache (pain).    Marland Kitchen dicyclomine (BENTYL) 10 MG capsule Take 10 mg by mouth daily as needed (for abdominal spasms.).     Marland Kitchen Multiple Vitamin (MULTIVITAMIN WITH MINERALS) TABS Take 1 tablet by mouth daily.    . Multiple Vitamins-Minerals (PRESERVISION AREDS 2 PO) Take 1 tablet by mouth  2 (two) times daily.      No results found for this or any previous visit (from the past 48 hour(s)). No results found.  Review of Systems  Constitutional: Negative.   HENT: Positive for congestion.   Respiratory: Negative.   Cardiovascular: Negative.     Blood pressure (!) 154/61, pulse 63, temperature 98.1 F (36.7 C), temperature source Oral, resp. rate 18, height 5' 3.75" (1.619 m), weight 74.7 kg, SpO2 97 %. Physical Exam  Constitutional: She appears well-developed and well-nourished.  HENT:  Deviated septum  Neck: Normal range of motion. Neck supple.  Cardiovascular: Normal rate.  Respiratory: Effort normal.     Assessment/Plan Adm for OP septoplasty and IT reduction  Jerrell Belfast, MD 08/24/2018, 10:54 AM

## 2018-08-24 NOTE — Transfer of Care (Signed)
Immediate Anesthesia Transfer of Care Note  Patient: Valerie Nash  Procedure(s) Performed: NASAL SEPTOPLASTY WITH TURBINATE REDUCTION (Bilateral Nose)  Patient Location: PACU  Anesthesia Type:General  Level of Consciousness: awake, alert  and patient cooperative  Airway & Oxygen Therapy: Patient Spontanous Breathing and Patient connected to face mask oxygen  Post-op Assessment: Report given to RN, Post -op Vital signs reviewed and stable and Patient moving all extremities  Post vital signs: Reviewed and stable  Last Vitals:  Vitals Value Taken Time  BP 144/79 08/24/2018 12:30 PM  Temp    Pulse 70 08/24/2018 12:31 PM  Resp 13 08/24/2018 12:31 PM  SpO2 100 % 08/24/2018 12:31 PM  Vitals shown include unvalidated device data.  Last Pain:  Vitals:   08/24/18 0934  TempSrc:   PainSc: 0-No pain         Complications: No apparent anesthesia complications

## 2018-08-24 NOTE — Op Note (Signed)
Operative Note: SEPTOPLASTY AND INFERIOR TURBINATE REDUCTION  Patient: Valerie Nash record number: [id number]  Date:08/24/2018  Pre-operative Indications: 1. Deviated nasal septum with nasal airway obstruction     2.  Bilateral inferior turbinate hypertrophy  Postoperative Indications: Same  Surgical Procedure: 1.  Nasal Septoplasty    2.  Bilateral Inferior Turbinate Reduction  Anesthesia: GET  Surgeon: Delsa Bern, M.D.  Complications: None  EBL: 50 cc  Findings: Severely deviated nasal septum with airway obstruction and bilateral inferior turbinate hypertrophy.   Brief History: The patient is a 72 y.o. female with a history of progressive nasal airway obstruction. The patient has been on medical therapy to reduce nasal mucosal edema including saline nasal spray and topical nasal steroids. Despite appropriate medical therapy the patient continues to have ongoing symptoms. Given the patient's history and findings, the above surgical procedures were recommended, risks and benefits were discussed in detail with the patient may understand and agree with our plan for surgery which is scheduled at [place] under general anesthesia as an outpatient.  Surgical Procedure: The patient is brought to the operating room on 08/24/2018 and placed in supine position on the operating table. General endotracheal anesthesia was established without difficulty. When the patient was adequately anesthetized, surgical timeout was performed and correct identification of the patient and the surgical procedure. The patient's nose was then injected with  6 cc of 1% lidocaine 1:100,000 dilution epinephrine which was injected in a submucosal fashion. The patient's nose was then packed with Afrin-soaked cottonoid pledgets were left in place for approximately 10 minutes lateral vasoconstriction and hemostasis. .  With the patient prepped draped and prepared for surgery, nasal septoplasty was  begun.  A left anterior hemitransfixion incision was created and a mucoperichondrial flap was elevated from anterior to posterior on the left-hand side. The anterior cartilaginous septum was crossed at the midline and a mucoperichondrial flap was elevated on the patient's right.  Swivel knife was then used to resect the anterior and mid cartilaginous portion of the nasal septum.  Resected cartilage was morcellized and returned to the mucoperichondrial pocket at the occlusion of the surgical procedure.  Dissection was then carried out from anterior to posterior removing deviated bone and cartilage including a large septal spur the overlying mucosa was preserved.  With the septum brought to good midline position, the morselized cartilage was returned to the mucoperichondrial pocket and the soft tissue/mucosal flaps were reapproximated with interrupted 4-0 gut suture on a Keith needle in a horizontal mattressing fashion.  Anterior hemitransfixion incision was closed with the same stitch.  Bilateral Doyle nasal septal splints were then placed after the application of Bactroban ointment and sutured in position with a 3-0 Ethilon suture.  Attention was then turned to the inferior turbinates, bilateral inferior turbinate intramural cautery was performed with cautery setting at 64 W.  2 submucosal passes were made in each inferior turbinate.  After completing cautery, anterior vertical incisions were created and overlying soft tissue was elevated, a small amount of turbinate bone was resected.  The turbinates were then outfractured to create a more patent nasal passageway.  Surgical sponge count was correct. An oral gastric tube was passed and the stomach contents were aspirated. Patient was awakened from anesthetic and transferred from the operating room to the recovery room in stable condition. There were no complications and blood loss was 50cc.   Delsa Bern, M.D. [facility] ENT 08/24/2018

## 2018-08-24 NOTE — Anesthesia Procedure Notes (Signed)
Procedure Name: Intubation Date/Time: 08/24/2018 11:33 AM Performed by: Kathryne Hitch, CRNA Pre-anesthesia Checklist: Patient identified, Emergency Drugs available, Suction available, Patient being monitored and Timeout performed Patient Re-evaluated:Patient Re-evaluated prior to induction Oxygen Delivery Method: Circle system utilized Preoxygenation: Pre-oxygenation with 100% oxygen Induction Type: IV induction Ventilation: Mask ventilation without difficulty Laryngoscope Size: Miller and 2 Grade View: Grade I Number of attempts: 1 Placement Confirmation: ETT inserted through vocal cords under direct vision,  positive ETCO2 and breath sounds checked- equal and bilateral Secured at: 22 cm Tube secured with: Tape Dental Injury: Teeth and Oropharynx as per pre-operative assessment

## 2018-08-24 NOTE — Anesthesia Preprocedure Evaluation (Addendum)
Anesthesia Evaluation  Patient identified by MRN, date of birth, ID band Patient awake    Reviewed: Allergy & Precautions, NPO status , Patient's Chart, lab work & pertinent test results, reviewed documented beta blocker date and time   History of Anesthesia Complications Negative for: history of anesthetic complications  Airway Mallampati: II  TM Distance: >3 FB Neck ROM: Full    Dental no notable dental hx.    Pulmonary neg pulmonary ROS, former smoker,    Pulmonary exam normal        Cardiovascular hypertension, Pt. on home beta blockers and Pt. on medications +CHF  Normal cardiovascular exam+ dysrhythmias (bifascicular block)   Hx of Takotsubo cardiomyopathy x2 (2014 & 2018) with EF as low as 10-15%. Most recent echo (01/21/17) shows fully recovered EF at 60-65% with no significant valvular abnormality and mildly reduced RV systolic function. LHC in 2018 showed normal coronary arteries. She was seen by Cardiology in February at which time she was doing well with no concerns.   Neuro/Psych PSYCHIATRIC DISORDERS Anxiety Depression negative neurological ROS     GI/Hepatic negative GI ROS, Neg liver ROS,   Endo/Other  negative endocrine ROS  Renal/GU negative Renal ROS  negative genitourinary   Musculoskeletal negative musculoskeletal ROS (+)   Abdominal   Peds  Hematology negative hematology ROS (+)   Anesthesia Other Findings   Reproductive/Obstetrics                            Anesthesia Physical Anesthesia Plan  ASA: III  Anesthesia Plan: General   Post-op Pain Management:    Induction: Intravenous  PONV Risk Score and Plan: 3 and Ondansetron, Dexamethasone, Midazolam and Treatment may vary due to age or medical condition  Airway Management Planned: Oral ETT  Additional Equipment: None  Intra-op Plan:   Post-operative Plan: Extubation in OR  Informed Consent: I have  reviewed the patients History and Physical, chart, labs and discussed the procedure including the risks, benefits and alternatives for the proposed anesthesia with the patient or authorized representative who has indicated his/her understanding and acceptance.     Plan Discussed with:   Anesthesia Plan Comments:        Anesthesia Quick Evaluation

## 2018-08-25 ENCOUNTER — Encounter (HOSPITAL_COMMUNITY): Payer: Self-pay | Admitting: Otolaryngology

## 2018-08-25 NOTE — Anesthesia Postprocedure Evaluation (Signed)
Anesthesia Post Note  Patient: Valerie Nash  Procedure(s) Performed: NASAL SEPTOPLASTY WITH TURBINATE REDUCTION (Bilateral Nose)     Patient location during evaluation: PACU Anesthesia Type: General Level of consciousness: awake and alert Pain management: pain level controlled Vital Signs Assessment: post-procedure vital signs reviewed and stable Respiratory status: spontaneous breathing, nonlabored ventilation and respiratory function stable Cardiovascular status: blood pressure returned to baseline and stable Postop Assessment: no apparent nausea or vomiting Anesthetic complications: no    Last Vitals:  Vitals:   08/24/18 1310 08/24/18 1334  BP: (!) 143/95 (!) 151/73  Pulse: 63 61  Resp: 15 16  Temp:    SpO2: 97% 98%    Last Pain:  Vitals:   08/24/18 1310  TempSrc:   PainSc: 0-No pain                 Lidia Collum

## 2018-10-24 DIAGNOSIS — Z853 Personal history of malignant neoplasm of breast: Secondary | ICD-10-CM | POA: Diagnosis not present

## 2018-10-24 DIAGNOSIS — Z1231 Encounter for screening mammogram for malignant neoplasm of breast: Secondary | ICD-10-CM | POA: Diagnosis not present

## 2018-10-25 DIAGNOSIS — H04123 Dry eye syndrome of bilateral lacrimal glands: Secondary | ICD-10-CM | POA: Diagnosis not present

## 2018-10-25 DIAGNOSIS — H353132 Nonexudative age-related macular degeneration, bilateral, intermediate dry stage: Secondary | ICD-10-CM | POA: Diagnosis not present

## 2018-10-25 DIAGNOSIS — H35363 Drusen (degenerative) of macula, bilateral: Secondary | ICD-10-CM | POA: Diagnosis not present

## 2018-10-25 DIAGNOSIS — H1859 Other hereditary corneal dystrophies: Secondary | ICD-10-CM | POA: Diagnosis not present

## 2018-10-25 DIAGNOSIS — H16223 Keratoconjunctivitis sicca, not specified as Sjogren's, bilateral: Secondary | ICD-10-CM | POA: Diagnosis not present

## 2018-11-01 DIAGNOSIS — N390 Urinary tract infection, site not specified: Secondary | ICD-10-CM | POA: Diagnosis not present

## 2018-11-14 DIAGNOSIS — R319 Hematuria, unspecified: Secondary | ICD-10-CM | POA: Diagnosis not present

## 2018-11-14 DIAGNOSIS — N390 Urinary tract infection, site not specified: Secondary | ICD-10-CM | POA: Diagnosis not present

## 2018-11-18 ENCOUNTER — Other Ambulatory Visit: Payer: Self-pay | Admitting: Cardiology

## 2018-11-20 NOTE — Progress Notes (Signed)
Cardiology Office Note   Date:  11/22/2018   ID:  Valerie Nash, DOB 17-Jul-1946, MRN [id number]  PCP:  Josetta Huddle, MD  Cardiologist:   Minus Breeding, MD    Chief Complaint  Patient presents with  . Cardiomyopathy      History of Present Illness: Valerie Nash is a 73 y.o. 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  She was admitted 12/2016 with elevated enzymes with 2D echo showing EF 15-20% (previously recovered to 57% in 2014), also showed grade 2 DD, mild AI/MR, mod TR/PR, PASP 78mmHg (severe pulm HTN). She underwent LHC 12/14/16 showing normal coronary arteries and fairly normal filling pressures including a PA pressure of 30 systolic, and findings c/w pulmonary HTN per report. Her presentation was felt again due to Takotsubo CM. She also had intermittent episodes of short bursts of nonsustained atrial tachycardia and was therefore continued on her BB. She developed an ACE cough and was changed to an ARB.    Since I last saw her she is done well.  She is doing water aerobics and other exercises. She does AHOY.  The patient denies any new symptoms such as chest discomfort, neck or arm discomfort. There has been no new shortness of breath, PND or orthopnea. There have been no reported palpitations, presyncope or syncope.   Past Medical History:  Diagnosis Date  . Anxiety   . 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 Colon polyps 2004   4 adenomatous polyps  . Depression   . Dyslipidemia   . Hypertension   . IBS (irritable bowel syndrome)   . Insomnia   . NSTEMI (non-ST elevated myocardial infarction) ([place])   . Obesity (BMI 30-39.9)   . PAT (paroxysmal atrial tachycardia) ([place])    a. first noted on tele 12/2016 during admission for Takotsubo.  . Pulmonary hypertension ([place])    a. Dx 12/2016 in setting of Takotsubo cardiomyopathy, EF 10-15%.  . RBBB (right bundle branch block with  left anterior fascicular block)   . Sinusitis   . Takotsubo cardiomyopathy    a. in 2014 -> EF improved shortly after to 57%. b. recurrent Takotsubo in 12/2016 with EF down to 10-15%, normal coronaries.    Past Surgical History:  Procedure Laterality Date  . ABDOMINAL HYSTERECTOMY    . BREAST LUMPECTOMY    . CESAREAN SECTION    . LEFT HEART CATHETERIZATION WITH CORONARY ANGIOGRAM Bilateral 12/26/2012   Procedure: LEFT HEART CATHETERIZATION WITH CORONARY ANGIOGRAM;  Surgeon: Sueanne Margarita, MD;  Location: [place] [facility] CATH [facility];  Service: Cardiovascular;  Laterality: Bilateral;  . NASAL SEPTOPLASTY W/ TURBINOPLASTY Bilateral 08/24/2018   Procedure: NASAL SEPTOPLASTY WITH TURBINATE REDUCTION;  Surgeon: Jerrell Belfast, MD;  Location: [facility];  Service: ENT;  Laterality: Bilateral;  . RIGHT/LEFT HEART CATH AND CORONARY ANGIOGRAPHY N/A 12/14/2016   Procedure: Right/Left Heart Cath and Coronary Angiography;  Surgeon: Lorretta Harp, MD;  Location: [place] CV LAB;  Service: Cardiovascular;  Laterality: N/A;  . SHOULDER SURGERY       Current Outpatient Medications  Medication Sig Dispense Refill  . acetaminophen (TYLENOL) 500 MG tablet Take 1,000 mg by mouth daily as needed for headache (pain).    . carvedilol (COREG) 6.25 MG tablet TAKE ONE TABLET BY MOUTH TWICE DAILY (Patient taking differently: Take 6.25 mg by mouth 2 (two) times daily. ) 180 tablet 3  . Cholecalciferol (VITAMIN D-3) 5000 units  TABS Take 5,000 Units by mouth daily.    . Coenzyme Q10 (COQ10 PO) Take 1 capsule by mouth every evening.    . dicyclomine (BENTYL) 10 MG capsule Take 10 mg by mouth daily as needed (for abdominal spasms.).     Marland Kitchen ezetimibe (ZETIA) 10 MG tablet Take 10 mg by mouth at bedtime.    Marland Kitchen losartan (COZAAR) 25 MG tablet TAKE 1 TABLET(25 MG) BY MOUTH TWICE DAILY 180 tablet 3  . Multiple Vitamin (MULTIVITAMIN WITH MINERALS) TABS Take 1 tablet by mouth daily.    . Multiple Vitamins-Minerals (PRESERVISION AREDS 2 PO) Take  1 tablet by mouth 2 (two) times daily.    . nitroGLYCERIN (NITROSTAT) 0.4 MG SL tablet Place 1 tablet (0.4 mg total) under the tongue every 5 (five) minutes x 3 doses as needed for chest pain. 25 tablet 12  . Omega-3 Fatty Acids (FISH OIL) 1200 MG CAPS Take 1,200 mg by mouth at bedtime.    Vladimir Faster Glycol-Propyl Glycol (LUBRICANT EYE DROPS) 0.4-0.3 % SOLN Place 1 drop into both eyes 3 (three) times daily as needed (for dry/irritated eyes.).    Marland Kitchen venlafaxine XR (EFFEXOR-XR) 75 MG 24 hr capsule Take 75 mg by mouth daily.     No current facility-administered medications for this visit.     Allergies:   Ace inhibitors; Pravastatin; Ambien [zolpidem tartrate]; Topamax [topiramate]; and Simvastatin    ROS:  Please see the history of present illness.   Otherwise, review of systems are positive for none.   All other systems are reviewed and negative.    PHYSICAL EXAM: VS:  BP 124/86   Pulse 74   Ht 5' 3.75" (1.619 m)   Wt 166 lb 9.6 oz (75.6 kg)   LMP  (LMP Unknown)   SpO2 99%   BMI 28.82 kg/m  , BMI Body mass index is 28.82 kg/m.  GENERAL:  Well appearing NECK:  No jugular venous distention, waveform within normal limits, carotid upstroke brisk and symmetric, no bruits, no thyromegaly LUNGS:  Clear to auscultation bilaterally CHEST:  Unremarkable HEART:  PMI not displaced or sustained,S1 and S2 within normal limits, no S3, no S4, no clicks, no rubs, no murmurs ABD:  Flat, positive bowel sounds normal in frequency in pitch, no bruits, no rebound, no guarding, no midline pulsatile mass, no hepatomegaly, no splenomegaly EXT:  2 plus pulses throughout, no edema, no cyanosis no clubbing   EKG:  EKG is not ordered today. EKG 08/24/2018 normal sinus rhythm, rate 62, right bundle branch block, left anterior fascicular block, no change from previous.   Recent Labs: 08/16/2018: BUN 10; Creatinine, Ser 0.96; Hemoglobin 13.7; Platelets 297; Potassium 3.5; Sodium 139    Lipid Panel      Component Value Date/Time   CHOL 209 (H) 12/14/2016 0441   TRIG 96 12/14/2016 0441   HDL 55 12/14/2016 0441   CHOLHDL 3.8 12/14/2016 0441   VLDL 19 12/14/2016 0441   LDLCALC 135 (H) 12/14/2016 0441   LDLDIRECT 143.3 08/28/2013 1053      Wt Readings from Last 3 Encounters:  11/22/18 166 lb 9.6 oz (75.6 kg)  08/24/18 164 lb 9.6 oz (74.7 kg)  08/16/18 164 lb 9.6 oz (74.7 kg)      Other studies Reviewed: Additional studies/ records that were reviewed today include: Labs Review of the above records demonstrates:  See elsewhere   ASSESSMENT AND PLAN:   TAKOTSUBO CM: Her ejection fraction recovered to normal on follow up echo in 2018.  No change in therapy.  I would not suspect any change in her ejection fraction based on absence of symptoms and no change in exam.  No further imaging is indicated.  HTN:    Her blood pressures well controlled and she will continue the meds as listed.  BIFASICULAR BLOCK:   She has no symptoms related to this.  We did discuss that if she can ever get syncope we will need to know about this as this could be related to a bradycardia arrhythmia.  For now no change in therapy.  Current medicines are reviewed at length with the patient today.  The patient does not have concerns regarding medicines.  The following changes have been made:  None  Labs/ tests ordered today include:  None  No orders of the defined types were placed in this encounter.    Disposition:   FU with me in 18 months.    Signed, Minus Breeding, MD  11/22/2018 5:31 PM    [place]

## 2018-11-22 ENCOUNTER — Encounter: Payer: Self-pay | Admitting: Cardiology

## 2018-11-22 ENCOUNTER — Ambulatory Visit (INDEPENDENT_AMBULATORY_CARE_PROVIDER_SITE_OTHER): Payer: Medicare Other | Admitting: Cardiology

## 2018-11-22 VITALS — BP 124/86 | HR 74 | Ht 63.75 in | Wt 166.6 lb

## 2018-11-22 DIAGNOSIS — I10 Essential (primary) hypertension: Secondary | ICD-10-CM | POA: Diagnosis not present

## 2018-11-22 DIAGNOSIS — I5181 Takotsubo syndrome: Secondary | ICD-10-CM

## 2018-11-22 DIAGNOSIS — R9431 Abnormal electrocardiogram [ECG] [EKG]: Secondary | ICD-10-CM

## 2018-11-22 NOTE — Patient Instructions (Signed)
Medication Instructions:  Continue current medications  If you need a refill on your cardiac medications before your next appointment, please call your pharmacy.  Labwork: None Ordered   Take the provided lab slips with you to the lab for your blood draw.   When you have your labs (blood work) drawn today and your tests are completely normal, you will receive your results only by MyChart Message (if you have MyChart) -OR-  A paper copy in the mail.  If you have any lab test that is abnormal or we need to change your treatment, we will call you to review these results.  Testing/Procedures: None ordered  Follow-Up: You will need a follow up appointment in 18 months.  Please call our office 2 months in advance to schedule this appointment.  You may see Dr Percival Spanish or one of the following Advanced Practice Providers on your designated Care Team:   Rosaria Ferries, PA-C . Jory Sims, DNP, ANP    At [facility], you and your health needs are our priority.  As part of our continuing mission to provide you with exceptional heart care, we have created designated Provider Care Teams.  These Care Teams include your primary Cardiologist (physician) and Advanced Practice Providers (APPs -  Physician Assistants and Nurse Practitioners) who all work together to provide you with the care you need, when you need it.  Thank you for choosing [facility] at [facility]!!

## 2019-01-12 DIAGNOSIS — H0014 Chalazion left upper eyelid: Secondary | ICD-10-CM | POA: Diagnosis not present

## 2019-01-12 DIAGNOSIS — H0100A Unspecified blepharitis right eye, upper and lower eyelids: Secondary | ICD-10-CM | POA: Diagnosis not present

## 2019-01-12 DIAGNOSIS — H0100B Unspecified blepharitis left eye, upper and lower eyelids: Secondary | ICD-10-CM | POA: Diagnosis not present

## 2019-02-08 IMAGING — CT CT ANGIO CHEST
2 of 8 series · 19 of 36 positions shown · IV contrast (isovue)
Comparison: Radiographs of same day.

CLINICAL DATA: Chest pain.

EXAM:
CT ANGIOGRAPHY CHEST WITH CONTRAST
TECHNIQUE: Multidetector CT imaging of the chest was performed using the
standard protocol during bolus administration of intravenous
contrast. Multiplanar CT image reconstructions and MIPs were
obtained to evaluate the vascular anatomy.
CONTRAST:  100 mL of Isovue 370 intravenously.

[Series 6: pe thins · axial · 0.64mm/px · z∈[+1109,+1361]mm · 18 of 282 slices shown]
[im 15/282  lung]
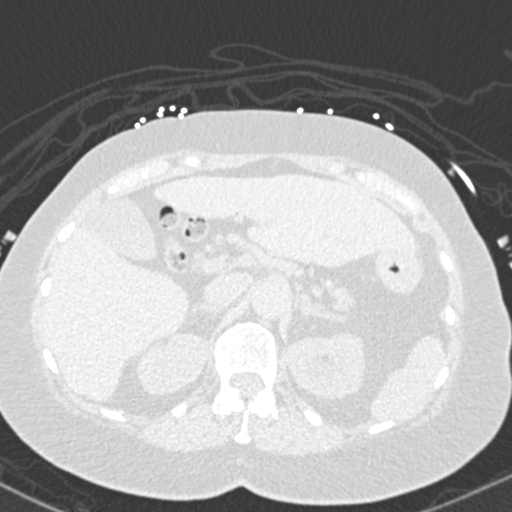
[im 30/282  mediastinal]
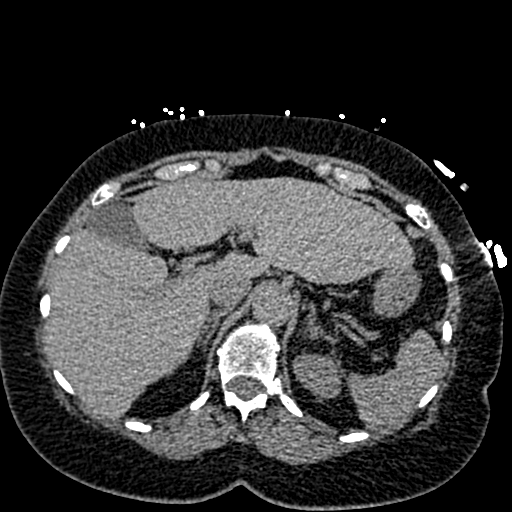
[im 45/282  lung]
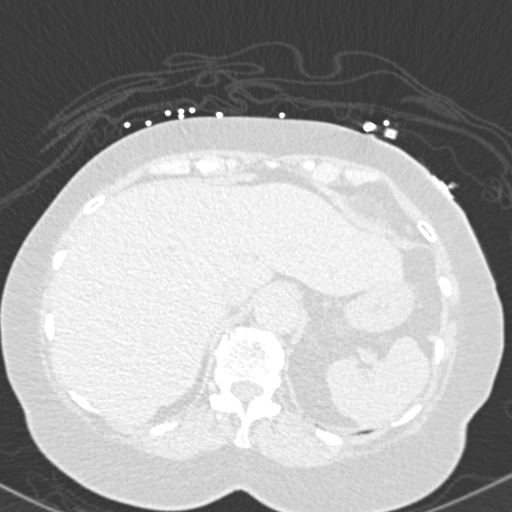
[im 60/282  mediastinal]
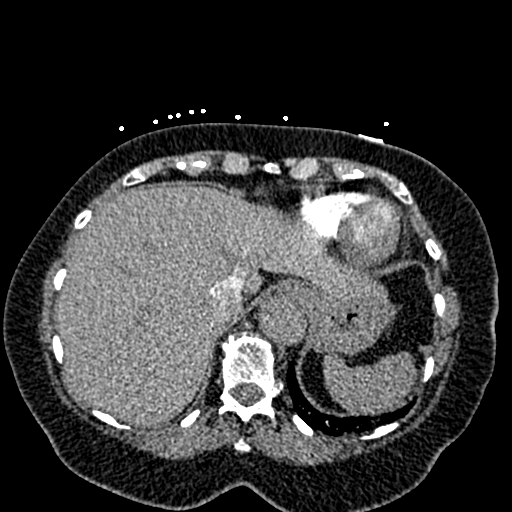
[im 74/282  lung]
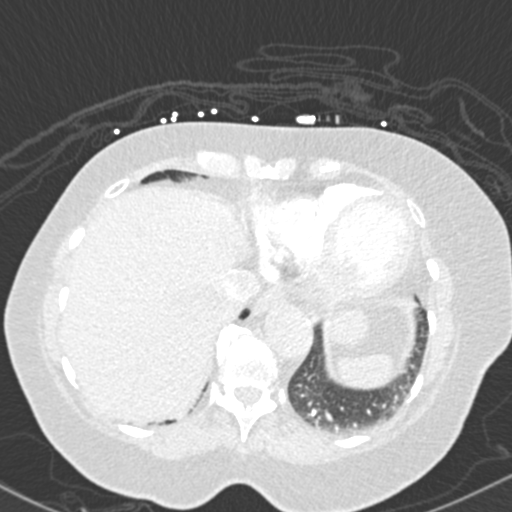
[im 89/282  mediastinal]
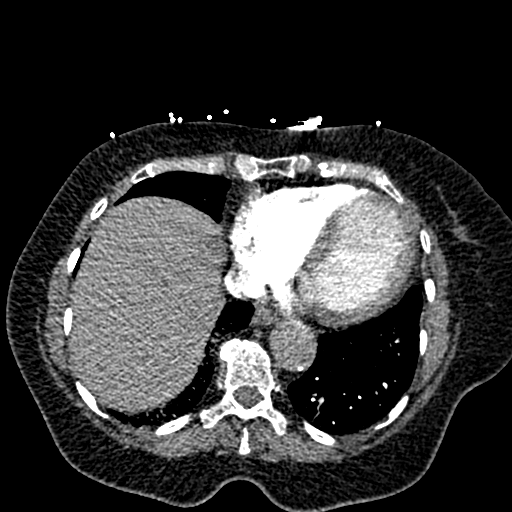
[im 104/282  lung]
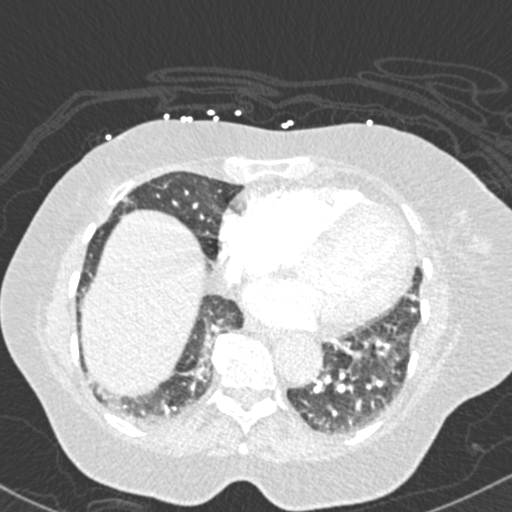
[im 119/282  mediastinal]
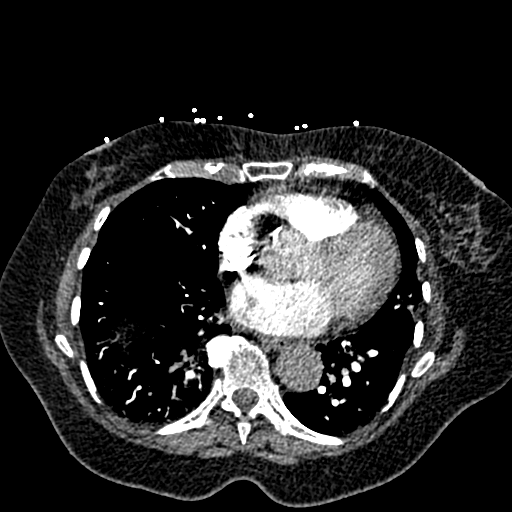
[im 134/282  lung]
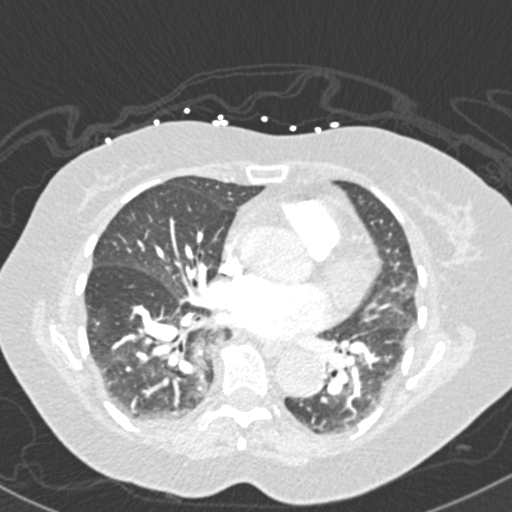
[im 148/282  mediastinal]
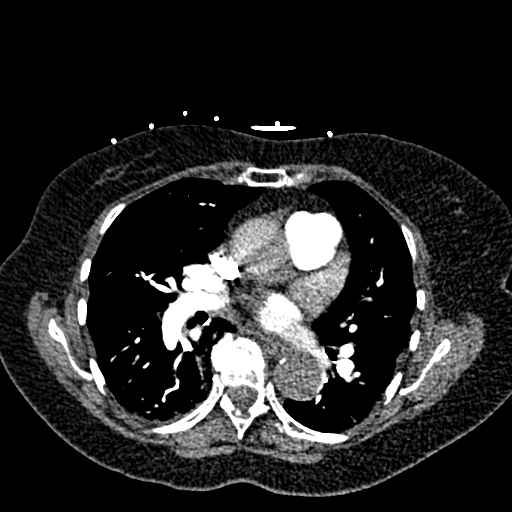
[im 163/282  lung]
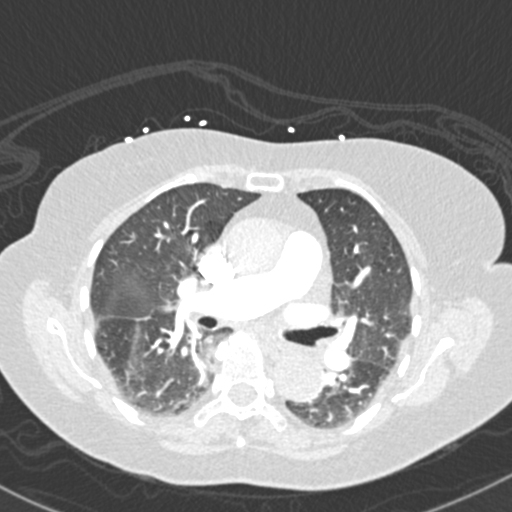
[im 178/282  mediastinal]
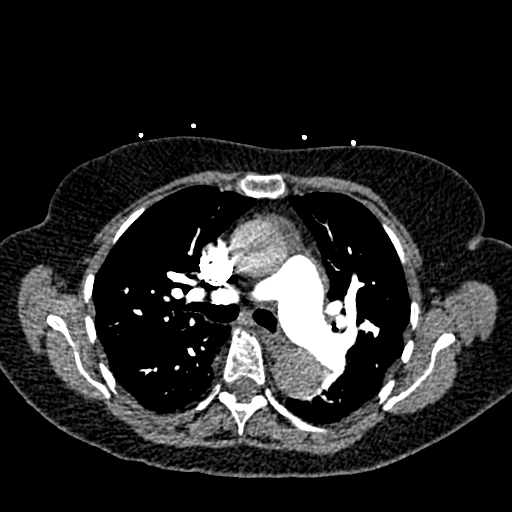
[im 193/282  lung]
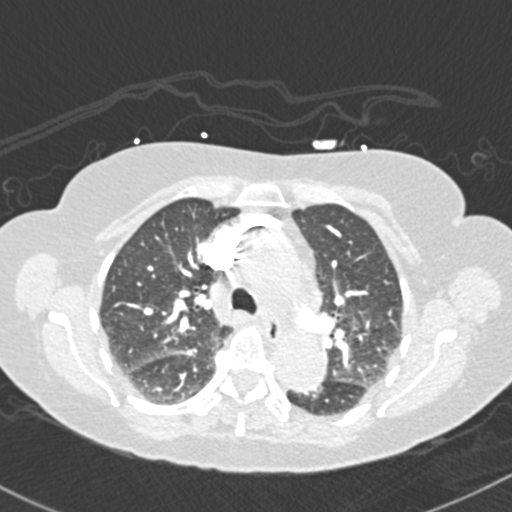
[im 208/282  mediastinal]
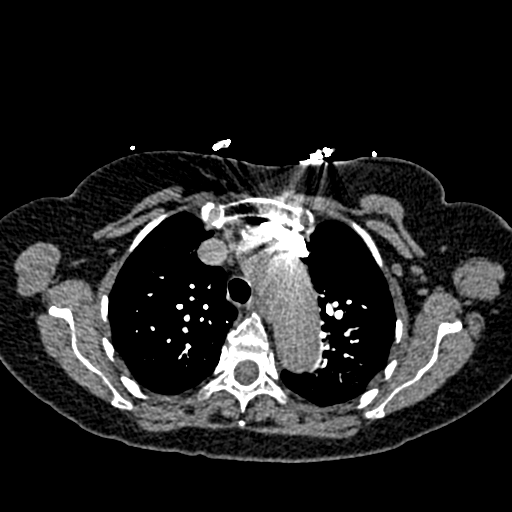
[im 222/282  lung]
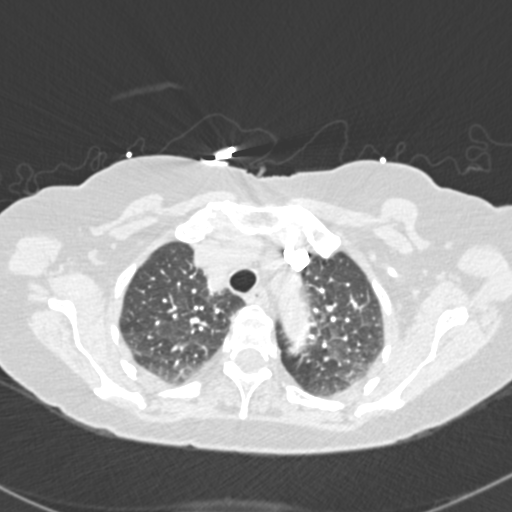
[im 237/282  mediastinal]
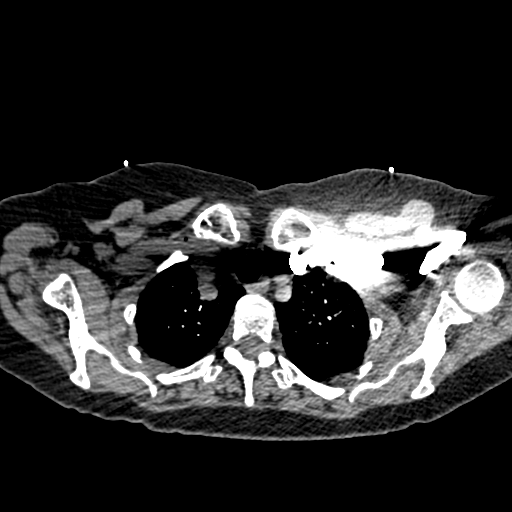
[im 252/282  lung]
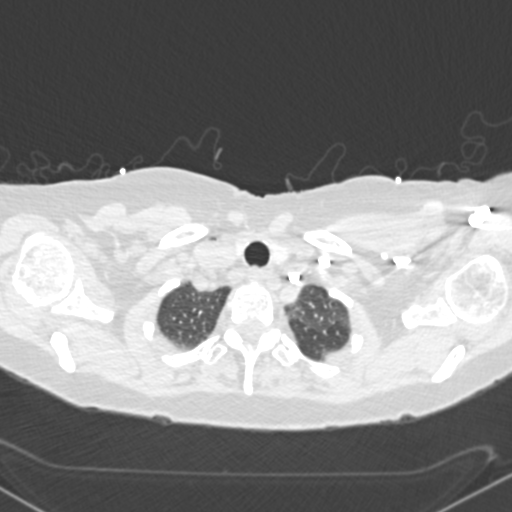
[im 267/282  mediastinal]
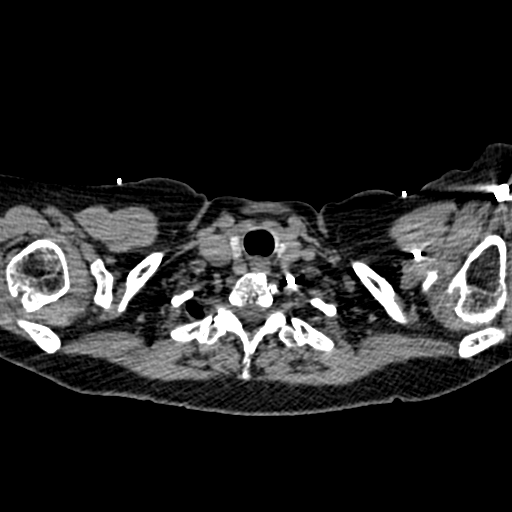

[Series 7: pe coronal mpr · coronal · 0.54mm/px · 1 of 110 slices shown]
[im 55/110  mediastinal]
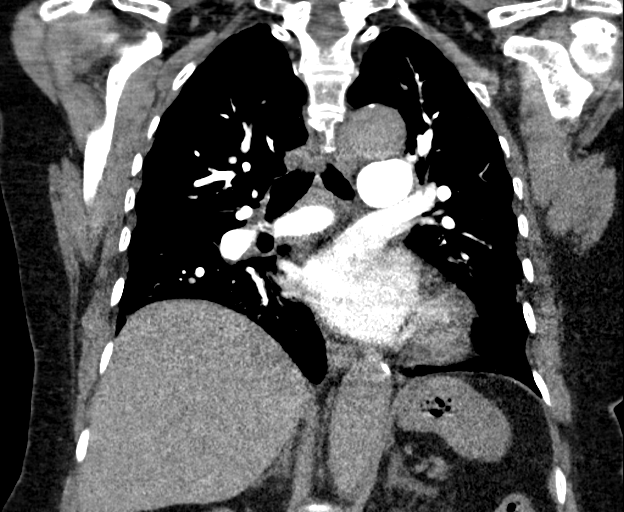

[19 of 36 positions shown; findings below may reference images not displayed]

FINDINGS: Cardiovascular: Satisfactory opacification of the pulmonary arteries
to the segmental level. No evidence of pulmonary embolism. Normal
heart size. No pericardial effusion.

Mediastinum/Nodes: No enlarged mediastinal, hilar, or axillary lymph
nodes. Thyroid gland, trachea, and esophagus demonstrate no
significant findings.

Lungs/Pleura: Lungs are clear. No pleural effusion or pneumothorax.

Upper Abdomen: No acute abnormality.

Musculoskeletal: No chest wall abnormality. No acute or significant
osseous findings.

Review of the MIP images confirms the above findings.
IMPRESSION: No definite evidence of pulmonary embolus. No acute abnormality seen
in the chest.

## 2019-03-07 DIAGNOSIS — Z20828 Contact with and (suspected) exposure to other viral communicable diseases: Secondary | ICD-10-CM | POA: Diagnosis not present

## 2019-04-27 DIAGNOSIS — H0100A Unspecified blepharitis right eye, upper and lower eyelids: Secondary | ICD-10-CM | POA: Diagnosis not present

## 2019-04-27 DIAGNOSIS — H43813 Vitreous degeneration, bilateral: Secondary | ICD-10-CM | POA: Diagnosis not present

## 2019-04-27 DIAGNOSIS — H1859 Other hereditary corneal dystrophies: Secondary | ICD-10-CM | POA: Diagnosis not present

## 2019-04-27 DIAGNOSIS — H353132 Nonexudative age-related macular degeneration, bilateral, intermediate dry stage: Secondary | ICD-10-CM | POA: Diagnosis not present

## 2019-04-27 DIAGNOSIS — H02834 Dermatochalasis of left upper eyelid: Secondary | ICD-10-CM | POA: Diagnosis not present

## 2019-04-27 DIAGNOSIS — H02831 Dermatochalasis of right upper eyelid: Secondary | ICD-10-CM | POA: Diagnosis not present

## 2019-04-27 DIAGNOSIS — H35363 Drusen (degenerative) of macula, bilateral: Secondary | ICD-10-CM | POA: Diagnosis not present

## 2019-04-27 DIAGNOSIS — H5203 Hypermetropia, bilateral: Secondary | ICD-10-CM | POA: Diagnosis not present

## 2019-04-27 DIAGNOSIS — H0100B Unspecified blepharitis left eye, upper and lower eyelids: Secondary | ICD-10-CM | POA: Diagnosis not present

## 2019-04-27 DIAGNOSIS — H52203 Unspecified astigmatism, bilateral: Secondary | ICD-10-CM | POA: Diagnosis not present

## 2019-04-27 DIAGNOSIS — Z961 Presence of intraocular lens: Secondary | ICD-10-CM | POA: Diagnosis not present

## 2019-04-27 DIAGNOSIS — H16223 Keratoconjunctivitis sicca, not specified as Sjogren's, bilateral: Secondary | ICD-10-CM | POA: Diagnosis not present

## 2019-06-26 ENCOUNTER — Other Ambulatory Visit: Payer: Self-pay

## 2019-06-26 MED ORDER — CARVEDILOL 6.25 MG PO TABS: 6.2500 mg | ORAL_TABLET | Freq: Two times a day (BID) | ORAL | 3 refills | Status: DC

## 2019-07-11 DIAGNOSIS — Z23 Encounter for immunization: Secondary | ICD-10-CM | POA: Diagnosis not present

## 2019-07-28 ENCOUNTER — Telehealth: Payer: Self-pay | Admitting: Cardiology

## 2019-07-28 DIAGNOSIS — I10 Essential (primary) hypertension: Secondary | ICD-10-CM

## 2019-07-28 MED ORDER — LOSARTAN POTASSIUM 25 MG PO TABS: ORAL_TABLET | ORAL | 3 refills | Status: DC

## 2019-07-28 NOTE — Telephone Encounter (Signed)
Agree 

## 2019-07-28 NOTE — Telephone Encounter (Signed)
° ° °  Pt c/o BP issue: STAT if pt c/o blurred vision, one-sided weakness or slurred speech  1. What are your last 5 BP readings? 125/95 HR 79, 151/105  2. Are you having any other symptoms (ex. Dizziness, headache, blurred vision, passed out)? Dizziness, headache  3. What is your BP issue?

## 2019-07-28 NOTE — Telephone Encounter (Signed)
Called patient- advised of message from PharmD.  Changed RX in system- ordered BMET.  Patient aware to come back in 1 month.   Patient will monitor BP and call back with any issues.

## 2019-07-28 NOTE — Telephone Encounter (Signed)
Current losartan dose if 25mg  twice daily.  Please increase losartan dose to 25mg  every morning and 50mg  every evening. Stay hydrated and continue to monitor BP twice daily for next 2-3 weeks.   Repeat BMET in 4 weeks after increasing losartan dose.

## 2019-07-28 NOTE — Telephone Encounter (Signed)
Patient states for the past few days she has had some dizziness, and headaches.  123/90 151/105 on 12th.  Patient took extra losartan on Wednesday- and BP dropped to 105/89 HR 95  This morning 125/92 this morning HR 79.   Patient denies chest pain, SOB or swelling in hands/feet.  Patient denies changes to any other medications, or diet changes (no increased sodium)  Advised patient I would route to MD and PharmD for any recommendations. Patient is just concerned with her previous history she never had symptoms before.

## 2019-08-01 ENCOUNTER — Other Ambulatory Visit: Payer: Self-pay

## 2019-08-01 MED ORDER — LOSARTAN POTASSIUM 25 MG PO TABS: ORAL_TABLET | ORAL | 3 refills | Status: DC

## 2019-08-16 ENCOUNTER — Other Ambulatory Visit: Payer: Self-pay | Admitting: *Deleted

## 2019-08-16 MED ORDER — LOSARTAN POTASSIUM 50 MG PO TABS: 50.0000 mg | ORAL_TABLET | Freq: Every day | ORAL | 1 refills | Status: DC

## 2019-08-16 MED ORDER — LOSARTAN POTASSIUM 25 MG PO TABS: 25.0000 mg | ORAL_TABLET | Freq: Every day | ORAL | 1 refills | Status: DC

## 2019-08-16 NOTE — Addendum Note (Signed)
Addended by: Ricci Barker on: 08/16/2019 04:30 PM   Modules accepted: Orders

## 2019-08-16 NOTE — Telephone Encounter (Signed)
Follow Up  Patient is calling in stating that she is unable to get the prescription due to insurance. Patient is wanting a call back to discuss.

## 2019-08-16 NOTE — Telephone Encounter (Signed)
The patient called in stating that due to insurance she would have to have two different prescriptions sent in for the Losartan:  One for Losartan 25 mg in the morning and one for Losartan 50 mg in the evening.   The new prescriptions have been sent in.

## 2019-08-25 DIAGNOSIS — I10 Essential (primary) hypertension: Secondary | ICD-10-CM | POA: Diagnosis not present

## 2019-08-25 LAB — BASIC METABOLIC PANEL
BUN/Creatinine Ratio: 17 (ref 12–28)
BUN: 12 mg/dL (ref 8–27)
CO2: 26 mmol/L (ref 20–29)
Calcium: 9.6 mg/dL (ref 8.7–10.3)
Chloride: 104 mmol/L (ref 96–106)
Creatinine, Ser: 0.7 mg/dL (ref 0.57–1.00)
GFR calc Af Amer: 99 mL/min/{1.73_m2} (ref >59)
GFR calc non Af Amer: 86 mL/min/{1.73_m2} (ref >59)
Glucose: 90 mg/dL (ref 65–99)
Potassium: 4.5 mmol/L (ref 3.5–5.2)
Sodium: 143 mmol/L (ref 134–144)

## 2019-08-29 ENCOUNTER — Telehealth: Payer: Self-pay | Admitting: Cardiology

## 2019-08-29 DIAGNOSIS — Z79899 Other long term (current) drug therapy: Secondary | ICD-10-CM | POA: Diagnosis not present

## 2019-08-29 DIAGNOSIS — Z0001 Encounter for general adult medical examination with abnormal findings: Secondary | ICD-10-CM | POA: Diagnosis not present

## 2019-08-29 DIAGNOSIS — I2510 Atherosclerotic heart disease of native coronary artery without angina pectoris: Secondary | ICD-10-CM | POA: Diagnosis not present

## 2019-08-29 DIAGNOSIS — E785 Hyperlipidemia, unspecified: Secondary | ICD-10-CM | POA: Diagnosis not present

## 2019-08-29 DIAGNOSIS — J342 Deviated nasal septum: Secondary | ICD-10-CM | POA: Diagnosis not present

## 2019-08-29 DIAGNOSIS — Z1389 Encounter for screening for other disorder: Secondary | ICD-10-CM | POA: Diagnosis not present

## 2019-08-29 DIAGNOSIS — F419 Anxiety disorder, unspecified: Secondary | ICD-10-CM | POA: Diagnosis not present

## 2019-08-29 DIAGNOSIS — M79652 Pain in left thigh: Secondary | ICD-10-CM | POA: Diagnosis not present

## 2019-08-29 DIAGNOSIS — N390 Urinary tract infection, site not specified: Secondary | ICD-10-CM | POA: Diagnosis not present

## 2019-08-29 DIAGNOSIS — I5181 Takotsubo syndrome: Secondary | ICD-10-CM | POA: Diagnosis not present

## 2019-08-29 DIAGNOSIS — I10 Essential (primary) hypertension: Secondary | ICD-10-CM | POA: Diagnosis not present

## 2019-08-29 DIAGNOSIS — I452 Bifascicular block: Secondary | ICD-10-CM | POA: Diagnosis not present

## 2019-08-29 NOTE — Telephone Encounter (Signed)
Patient is returning call about lab results 

## 2019-08-29 NOTE — Telephone Encounter (Signed)
Advised patient of lab results and verified Rx for Losartan 25 mg and 50 mg at pharmacy for pick up.

## 2019-10-30 DIAGNOSIS — Z1231 Encounter for screening mammogram for malignant neoplasm of breast: Secondary | ICD-10-CM | POA: Diagnosis not present

## 2019-11-03 DIAGNOSIS — H3554 Dystrophies primarily involving the retinal pigment epithelium: Secondary | ICD-10-CM | POA: Diagnosis not present

## 2019-11-03 DIAGNOSIS — H353132 Nonexudative age-related macular degeneration, bilateral, intermediate dry stage: Secondary | ICD-10-CM | POA: Diagnosis not present

## 2019-11-03 DIAGNOSIS — H35363 Drusen (degenerative) of macula, bilateral: Secondary | ICD-10-CM | POA: Diagnosis not present

## 2019-11-03 DIAGNOSIS — H16223 Keratoconjunctivitis sicca, not specified as Sjogren's, bilateral: Secondary | ICD-10-CM | POA: Diagnosis not present

## 2019-11-03 DIAGNOSIS — H18593 Other hereditary corneal dystrophies, bilateral: Secondary | ICD-10-CM | POA: Diagnosis not present

## 2019-11-06 ENCOUNTER — Ambulatory Visit: Payer: Medicare Other | Attending: Internal Medicine

## 2019-11-06 DIAGNOSIS — Z23 Encounter for immunization: Secondary | ICD-10-CM

## 2019-11-06 NOTE — Progress Notes (Signed)
   Covid-19 Vaccination Clinic  Name:  Valerie Nash    MRN: [id number] DOB: 1946-08-28  11/06/2019  Valerie Nash was observed post Covid-19 immunization for 15 minutes without incidence. She was provided with Vaccine Information Sheet and instruction to access the V-Safe system.   Valerie Nash was instructed to call 911 with any severe reactions post vaccine: Marland Kitchen Difficulty breathing  . Swelling of your face and throat  . A fast heartbeat  . A bad rash all over your body  . Dizziness and weakness    Immunizations Administered    Name Date Dose VIS Date Route   Pfizer COVID-19 Vaccine 11/06/2019 11:03 AM 0.3 mL 09/22/2019 Intramuscular   Manufacturer: [place]   Lot: [id number]   [place]: [id number]

## 2019-11-27 ENCOUNTER — Ambulatory Visit: Payer: Medicare Other | Attending: Internal Medicine

## 2019-11-27 ENCOUNTER — Ambulatory Visit: Payer: Medicare Other

## 2019-11-27 DIAGNOSIS — Z23 Encounter for immunization: Secondary | ICD-10-CM | POA: Insufficient documentation

## 2019-11-27 NOTE — Progress Notes (Signed)
   Covid-19 Vaccination Clinic  Name:  Valerie Nash    MRN: [id number] DOB: 05-26-1946  11/27/2019  [name] was observed post Covid-19 immunization for 15 minutes without incidence. She was provided with Vaccine Information Sheet and instruction to access the V-Safe system.   [name] was instructed to call 911 with any severe reactions post vaccine: Marland Kitchen Difficulty breathing  . Swelling of your face and throat  . A fast heartbeat  . A bad rash all over your body  . Dizziness and weakness    Immunizations Administered    Name Date Dose VIS Date Route   Pfizer COVID-19 Vaccine 11/27/2019 11:05 AM 0.3 mL 09/22/2019 Intramuscular   Manufacturer: [place]   Lot: [id number]   [place]: [id number]

## 2019-11-29 ENCOUNTER — Ambulatory Visit: Payer: Medicare Other

## 2019-12-14 DIAGNOSIS — M545 Low back pain: Secondary | ICD-10-CM | POA: Diagnosis not present

## 2020-01-08 DIAGNOSIS — M5136 Other intervertebral disc degeneration, lumbar region: Secondary | ICD-10-CM | POA: Diagnosis not present

## 2020-02-01 DIAGNOSIS — M5136 Other intervertebral disc degeneration, lumbar region: Secondary | ICD-10-CM | POA: Diagnosis not present

## 2020-02-22 DIAGNOSIS — M5136 Other intervertebral disc degeneration, lumbar region: Secondary | ICD-10-CM | POA: Diagnosis not present

## 2020-03-19 DIAGNOSIS — I10 Essential (primary) hypertension: Secondary | ICD-10-CM | POA: Diagnosis not present

## 2020-03-28 DIAGNOSIS — M5136 Other intervertebral disc degeneration, lumbar region: Secondary | ICD-10-CM | POA: Diagnosis not present

## 2020-04-10 DIAGNOSIS — Z7189 Other specified counseling: Secondary | ICD-10-CM | POA: Insufficient documentation

## 2020-04-10 NOTE — Progress Notes (Signed)
Cardiology Office Note   Date:  04/11/2020   ID:  Valerie Nash, DOB 12/06/1945, MRN [id number]  PCP:  Josetta Huddle, MD  Cardiologist:   Minus Breeding, MD    Chief Complaint  Patient presents with  . Palpitations      History of Present Illness: Valerie Nash is a 74 y.o. 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  She was admitted 12/2016 with elevated enzymes with 2D echo showing EF 15-20% (previously recovered to 57% in 2014), also showed grade 2 DD, mild AI/MR, mod TR/PR, PASP 52mmHg (severe pulm HTN). She underwent LHC 12/14/16 showing normal coronary arteries and fairly normal filling pressures including a PA pressure of 30 systolic, and findings c/w pulmonary HTN per report. Her presentation was felt again due to Takotsubo CM. She also had intermittent episodes of short bursts of nonsustained atrial tachycardia and was therefore continued on her BB. She developed an ACE cough and was changed to an ARB.    Her most recent echo in April demonstrated an EF of 60 to 65%.  There was some mild right ventricular dysfunction but no evidence of pulmonary hypertension.  Since I last saw her she has had some low blood pressures.  It turns out she was started on HCTZ because blood pressures were up a little bit.  She was then getting the systolics as low as 98.  She would feel dizzy with this.  She is not had any frank syncope though.  She is able to do activities and exercise and not bring on any symptoms.  She has lost 15 pounds by intermittent fasting.  She denies any new shortness of breath, PND or orthopnea.  She has some rare palpitations.  She is not having any chest pressure, neck or arm discomfort.  She has none of the nausea that was the sign of her Takotsubo's previously.   Past Medical History:  Diagnosis Date  . Anxiety   . 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 Colon polyps 2004   4  adenomatous polyps  . Depression   . Dyslipidemia   . Hypertension   . IBS (irritable bowel syndrome)   . Insomnia   . NSTEMI (non-ST elevated myocardial infarction) ([place])   . Obesity (BMI 30-39.9)   . PAT (paroxysmal atrial tachycardia) ([place])    a. first noted on tele 12/2016 during admission for Takotsubo.  . Pulmonary hypertension ([place])    a. Dx 12/2016 in setting of Takotsubo cardiomyopathy, EF 10-15%.  . RBBB (right bundle branch block with left anterior fascicular block)   . Sinusitis   . Takotsubo cardiomyopathy    a. in 2014 -> EF improved shortly after to 57%. b. recurrent Takotsubo in 12/2016 with EF down to 10-15%, normal coronaries.    Past Surgical History:  Procedure Laterality Date  . ABDOMINAL HYSTERECTOMY    . BREAST LUMPECTOMY    . CESAREAN SECTION    . LEFT HEART CATHETERIZATION WITH CORONARY ANGIOGRAM Bilateral 12/26/2012   Procedure: LEFT HEART CATHETERIZATION WITH CORONARY ANGIOGRAM;  Surgeon: Sueanne Margarita, MD;  Location: [place] CATH LAB;  Service: Cardiovascular;  Laterality: Bilateral;  . NASAL SEPTOPLASTY W/ TURBINOPLASTY Bilateral 08/24/2018   Procedure: NASAL SEPTOPLASTY WITH TURBINATE REDUCTION;  Surgeon: Jerrell Belfast, MD;  Location: [place];  Service: ENT;  Laterality: Bilateral;  . RIGHT/LEFT HEART CATH AND CORONARY ANGIOGRAPHY N/A 12/14/2016   Procedure: Right/Left Heart Cath  and Coronary Angiography;  Surgeon: Lorretta Harp, MD;  Location: [place] CV LAB;  Service: Cardiovascular;  Laterality: N/A;  . SHOULDER SURGERY       Current Outpatient Medications  Medication Sig Dispense Refill  . acetaminophen (TYLENOL) 500 MG tablet Take 1,000 mg by mouth daily as needed for headache (pain).    . carvedilol (COREG) 6.25 MG tablet Take 1 tablet (6.25 mg total) by mouth 2 (two) times daily. 180 tablet 3  . Cholecalciferol (VITAMIN D-3) 5000 units TABS Take 5,000 Units by mouth daily.    Marland Kitchen dicyclomine (BENTYL) 10 MG capsule Take 10 mg by mouth daily as  needed (for abdominal spasms.).     Marland Kitchen ezetimibe (ZETIA) 10 MG tablet Take 10 mg by mouth at bedtime.    Marland Kitchen losartan (COZAAR) 25 MG tablet Take 1 tablet (25 mg total) by mouth daily with breakfast. 30 tablet 1  . losartan (COZAAR) 50 MG tablet Take 1 tablet (50 mg total) by mouth daily with supper. 30 tablet 1  . Multiple Vitamin (MULTIVITAMIN WITH MINERALS) TABS Take 1 tablet by mouth daily.    . nitroGLYCERIN (NITROSTAT) 0.4 MG SL tablet Place 1 tablet (0.4 mg total) under the tongue every 5 (five) minutes x 3 doses as needed for chest pain. 25 tablet 12  . Polyethyl Glycol-Propyl Glycol (LUBRICANT EYE DROPS) 0.4-0.3 % SOLN Place 1 drop into both eyes 3 (three) times daily as needed (for dry/irritated eyes.).    Marland Kitchen venlafaxine XR (EFFEXOR-XR) 75 MG 24 hr capsule Take 75 mg by mouth daily.     No current facility-administered medications for this visit.    Allergies:   Ace inhibitors, Pravastatin, Ambien [zolpidem tartrate], Topamax [topiramate], and Simvastatin    ROS:  Please see the history of present illness.   Otherwise, review of systems are positive for none.   All other systems are reviewed and negative.    PHYSICAL EXAM: VS:  BP 110/80 (BP Location: Right Arm, Patient Position: Sitting, Cuff Size: Normal)   Pulse (!) 55   Temp (!) 97.2 F (36.2 C)   Ht 5\' 3"  (1.6 m)   Wt 153 lb (69.4 kg)   LMP  (LMP Unknown)   BMI 27.10 kg/m  , BMI Body mass index is 27.1 kg/m.  GENERAL:  Well appearing NECK:  No jugular venous distention, waveform within normal limits, carotid upstroke brisk and symmetric, no bruits, no thyromegaly LUNGS:  Clear to auscultation bilaterally CHEST:  Unremarkable HEART:  PMI not displaced or sustained,S1 and S2 within normal limits, no S3, no S4, no clicks, no rubs, no murmurs ABD:  Flat, positive bowel sounds normal in frequency in pitch, no bruits, no rebound, no guarding, no midline pulsatile mass, no hepatomegaly, no splenomegaly EXT:  2 plus pulses  throughout, no edema, no cyanosis no clubbing   EKG:  EKG is ordered today. EKG 08/24/2018 normal sinus rhythm, rate 55, right bundle branch block, left anterior fascicular block, no change from previous.   Recent Labs: 08/25/2019: BUN 12; Creatinine, Ser 0.70; Potassium 4.5; Sodium 143    Lipid Panel    Component Value Date/Time   CHOL 209 (H) 12/14/2016 0441   TRIG 96 12/14/2016 0441   HDL 55 12/14/2016 0441   CHOLHDL 3.8 12/14/2016 0441   VLDL 19 12/14/2016 0441   LDLCALC 135 (H) 12/14/2016 0441   LDLDIRECT 143.3 08/28/2013 1053      Wt Readings from Last 3 Encounters:  04/11/20 153 lb (69.4 kg)  11/22/18 166 lb 9.6 oz (75.6 kg)  08/24/18 164 lb 9.6 oz (74.7 kg)      Other studies Reviewed: Additional studies/ records that were reviewed today include: Labs  Review of the above records demonstrates: See elsewhere   ASSESSMENT AND PLAN:   TAKOTSUBO CM: Her ejection fraction recovered to normal on follow up echo in 2018.  I would not suspect that her ejection fraction is reduced based on absence of symptoms and no change in exam.  No further imaging at this point.   HTN:    Her blood pressures are actually running low.  She is stopping the HCTZ which she did a few days ago.  We will see if her blood pressure starts to come up and if not I will need to cut back on her Cozaar first.    BIFASICULAR BLOCK:   She is not had any syncope related to this.  We went through again the potential for symptomatic bradycardia arrhythmias and she would let me know if she has anything of presyncope or syncope other complaints. hange in therapy.  COVID EDUCATION: She has had her vaccine.  Current medicines are reviewed at length with the patient today.  The patient does not have concerns regarding medicines.  The following changes have been made:  None  Labs/ tests ordered today include:  Non  Orders Placed This Encounter  Procedures  . EKG 12-Lead     Disposition:   FU with  me in 12 months.    Signed, Minus Breeding, MD  04/11/2020 11:13 AM    [place]

## 2020-04-11 ENCOUNTER — Other Ambulatory Visit: Payer: Self-pay

## 2020-04-11 ENCOUNTER — Encounter: Payer: Self-pay | Admitting: Cardiology

## 2020-04-11 ENCOUNTER — Ambulatory Visit (INDEPENDENT_AMBULATORY_CARE_PROVIDER_SITE_OTHER): Payer: Medicare Other | Admitting: Cardiology

## 2020-04-11 VITALS — BP 110/80 | HR 55 | Temp 97.2°F | Ht 63.0 in | Wt 153.0 lb

## 2020-04-11 DIAGNOSIS — I10 Essential (primary) hypertension: Secondary | ICD-10-CM | POA: Diagnosis not present

## 2020-04-11 DIAGNOSIS — I5181 Takotsubo syndrome: Secondary | ICD-10-CM

## 2020-04-11 DIAGNOSIS — Z7189 Other specified counseling: Secondary | ICD-10-CM

## 2020-04-11 DIAGNOSIS — R9431 Abnormal electrocardiogram [ECG] [EKG]: Secondary | ICD-10-CM

## 2020-04-11 NOTE — Patient Instructions (Signed)
Medication Instructions:  STOP YOUR HCTZ *If you need a refill on your cardiac medications before your next appointment, please call your pharmacy*   Follow-Up: At [facility], you and your health needs are our priority.  As part of our continuing mission to provide you with exceptional heart care, we have created designated Provider Care Teams.  These Care Teams include your primary Cardiologist (physician) and Advanced Practice Providers (APPs -  Physician Assistants and Nurse Practitioners) who all work together to provide you with the care you need, when you need it.  We recommend signing up for the patient portal called "MyChart".  Sign up information is provided on this After Visit Summary.  MyChart is used to connect with patients for Virtual Visits (Telemedicine).  Patients are able to view lab/test results, encounter notes, upcoming appointments, etc.  Non-urgent messages can be sent to your provider as well.   To learn more about what you can do with MyChart, go to [email or web address].    Your next appointment:   12 month(s)  The format for your next appointment:   In Person  Provider:   Minus Breeding, MD

## 2020-05-09 DIAGNOSIS — H02831 Dermatochalasis of right upper eyelid: Secondary | ICD-10-CM | POA: Diagnosis not present

## 2020-05-09 DIAGNOSIS — H02834 Dermatochalasis of left upper eyelid: Secondary | ICD-10-CM | POA: Diagnosis not present

## 2020-05-09 DIAGNOSIS — H0100B Unspecified blepharitis left eye, upper and lower eyelids: Secondary | ICD-10-CM | POA: Diagnosis not present

## 2020-05-09 DIAGNOSIS — H5203 Hypermetropia, bilateral: Secondary | ICD-10-CM | POA: Diagnosis not present

## 2020-05-09 DIAGNOSIS — H353132 Nonexudative age-related macular degeneration, bilateral, intermediate dry stage: Secondary | ICD-10-CM | POA: Diagnosis not present

## 2020-05-09 DIAGNOSIS — H0100A Unspecified blepharitis right eye, upper and lower eyelids: Secondary | ICD-10-CM | POA: Diagnosis not present

## 2020-05-09 DIAGNOSIS — H524 Presbyopia: Secondary | ICD-10-CM | POA: Diagnosis not present

## 2020-05-09 DIAGNOSIS — H52203 Unspecified astigmatism, bilateral: Secondary | ICD-10-CM | POA: Diagnosis not present

## 2020-05-09 DIAGNOSIS — H16223 Keratoconjunctivitis sicca, not specified as Sjogren's, bilateral: Secondary | ICD-10-CM | POA: Diagnosis not present

## 2020-05-09 DIAGNOSIS — Z961 Presence of intraocular lens: Secondary | ICD-10-CM | POA: Diagnosis not present

## 2020-05-09 DIAGNOSIS — H35363 Drusen (degenerative) of macula, bilateral: Secondary | ICD-10-CM | POA: Diagnosis not present

## 2020-05-09 DIAGNOSIS — H43813 Vitreous degeneration, bilateral: Secondary | ICD-10-CM | POA: Diagnosis not present

## 2020-05-14 DIAGNOSIS — R350 Frequency of micturition: Secondary | ICD-10-CM | POA: Diagnosis not present

## 2020-05-14 DIAGNOSIS — R319 Hematuria, unspecified: Secondary | ICD-10-CM | POA: Diagnosis not present

## 2020-05-16 DIAGNOSIS — M5136 Other intervertebral disc degeneration, lumbar region: Secondary | ICD-10-CM | POA: Diagnosis not present

## 2020-06-06 DIAGNOSIS — M5136 Other intervertebral disc degeneration, lumbar region: Secondary | ICD-10-CM | POA: Diagnosis not present

## 2020-06-19 ENCOUNTER — Other Ambulatory Visit: Payer: Self-pay | Admitting: Cardiology

## 2020-07-01 DIAGNOSIS — C44719 Basal cell carcinoma of skin of left lower limb, including hip: Secondary | ICD-10-CM | POA: Diagnosis not present

## 2020-07-01 DIAGNOSIS — L821 Other seborrheic keratosis: Secondary | ICD-10-CM | POA: Diagnosis not present

## 2020-07-01 DIAGNOSIS — D1801 Hemangioma of skin and subcutaneous tissue: Secondary | ICD-10-CM | POA: Diagnosis not present

## 2020-07-01 DIAGNOSIS — D225 Melanocytic nevi of trunk: Secondary | ICD-10-CM | POA: Diagnosis not present

## 2020-07-01 DIAGNOSIS — L570 Actinic keratosis: Secondary | ICD-10-CM | POA: Diagnosis not present

## 2020-07-01 DIAGNOSIS — L814 Other melanin hyperpigmentation: Secondary | ICD-10-CM | POA: Diagnosis not present

## 2020-07-01 DIAGNOSIS — Z85828 Personal history of other malignant neoplasm of skin: Secondary | ICD-10-CM | POA: Diagnosis not present

## 2020-07-01 DIAGNOSIS — C44712 Basal cell carcinoma of skin of right lower limb, including hip: Secondary | ICD-10-CM | POA: Diagnosis not present

## 2020-07-08 DIAGNOSIS — Z23 Encounter for immunization: Secondary | ICD-10-CM | POA: Diagnosis not present

## 2020-08-01 DIAGNOSIS — Z23 Encounter for immunization: Secondary | ICD-10-CM | POA: Diagnosis not present

## 2020-08-09 ENCOUNTER — Other Ambulatory Visit: Payer: Medicare Other

## 2020-08-09 ENCOUNTER — Other Ambulatory Visit: Payer: Self-pay

## 2020-08-09 DIAGNOSIS — Z20822 Contact with and (suspected) exposure to covid-19: Secondary | ICD-10-CM | POA: Diagnosis not present

## 2020-08-10 LAB — SARS-COV-2, NAA 2 DAY TAT

## 2020-08-10 LAB — NOVEL CORONAVIRUS, NAA: SARS-CoV-2, NAA: NOT DETECTED

## 2020-09-18 DIAGNOSIS — Z79899 Other long term (current) drug therapy: Secondary | ICD-10-CM | POA: Diagnosis not present

## 2020-09-18 DIAGNOSIS — Z0000 Encounter for general adult medical examination without abnormal findings: Secondary | ICD-10-CM | POA: Diagnosis not present

## 2020-09-18 DIAGNOSIS — I10 Essential (primary) hypertension: Secondary | ICD-10-CM | POA: Diagnosis not present

## 2020-09-18 DIAGNOSIS — I5181 Takotsubo syndrome: Secondary | ICD-10-CM | POA: Diagnosis not present

## 2020-09-18 DIAGNOSIS — R7309 Other abnormal glucose: Secondary | ICD-10-CM | POA: Diagnosis not present

## 2020-09-18 DIAGNOSIS — E785 Hyperlipidemia, unspecified: Secondary | ICD-10-CM | POA: Diagnosis not present

## 2020-09-18 DIAGNOSIS — Z1389 Encounter for screening for other disorder: Secondary | ICD-10-CM | POA: Diagnosis not present

## 2020-09-18 DIAGNOSIS — I452 Bifascicular block: Secondary | ICD-10-CM | POA: Diagnosis not present

## 2020-09-18 DIAGNOSIS — F419 Anxiety disorder, unspecified: Secondary | ICD-10-CM | POA: Diagnosis not present

## 2020-09-18 DIAGNOSIS — G4700 Insomnia, unspecified: Secondary | ICD-10-CM | POA: Diagnosis not present

## 2020-09-18 DIAGNOSIS — I2510 Atherosclerotic heart disease of native coronary artery without angina pectoris: Secondary | ICD-10-CM | POA: Diagnosis not present

## 2020-09-26 DIAGNOSIS — M5416 Radiculopathy, lumbar region: Secondary | ICD-10-CM | POA: Diagnosis not present

## 2020-10-10 DIAGNOSIS — M5416 Radiculopathy, lumbar region: Secondary | ICD-10-CM | POA: Diagnosis not present

## 2020-11-20 ENCOUNTER — Encounter: Payer: Self-pay | Admitting: Gastroenterology

## 2020-12-02 DIAGNOSIS — R3129 Other microscopic hematuria: Secondary | ICD-10-CM | POA: Diagnosis not present

## 2020-12-02 DIAGNOSIS — R7303 Prediabetes: Secondary | ICD-10-CM | POA: Diagnosis not present

## 2020-12-02 DIAGNOSIS — R350 Frequency of micturition: Secondary | ICD-10-CM | POA: Diagnosis not present

## 2020-12-06 DIAGNOSIS — R3129 Other microscopic hematuria: Secondary | ICD-10-CM | POA: Diagnosis not present

## 2020-12-06 DIAGNOSIS — R319 Hematuria, unspecified: Secondary | ICD-10-CM | POA: Diagnosis not present

## 2021-01-03 DIAGNOSIS — I10 Essential (primary) hypertension: Secondary | ICD-10-CM | POA: Diagnosis not present

## 2021-01-03 DIAGNOSIS — R3129 Other microscopic hematuria: Secondary | ICD-10-CM | POA: Diagnosis not present

## 2021-01-03 DIAGNOSIS — R350 Frequency of micturition: Secondary | ICD-10-CM | POA: Diagnosis not present

## 2021-01-03 DIAGNOSIS — R7303 Prediabetes: Secondary | ICD-10-CM | POA: Diagnosis not present

## 2021-02-05 ENCOUNTER — Encounter: Payer: Self-pay | Admitting: Gastroenterology

## 2021-02-20 ENCOUNTER — Other Ambulatory Visit: Payer: Self-pay

## 2021-02-20 ENCOUNTER — Ambulatory Visit (AMBULATORY_SURGERY_CENTER): Payer: Self-pay | Admitting: *Deleted

## 2021-02-20 VITALS — Ht 63.0 in | Wt 151.0 lb

## 2021-02-20 DIAGNOSIS — Z8371 Family history of colonic polyps: Secondary | ICD-10-CM

## 2021-02-20 DIAGNOSIS — Z83719 Family history of colon polyps, unspecified: Secondary | ICD-10-CM

## 2021-02-20 DIAGNOSIS — Z800 Family history of malignant neoplasm of digestive organs: Secondary | ICD-10-CM

## 2021-02-20 MED ORDER — SUTAB 1479-225-188 MG PO TABS: 1.0000 | ORAL_TABLET | ORAL | 0 refills | Status: DC

## 2021-02-20 NOTE — Progress Notes (Signed)
Patient is here in-person for PV. Patient denies any allergies to eggs or soy. Patient denies any problems with anesthesia/sedation. Patient denies any oxygen use at home. Patient denies taking any diet/weight loss medications or blood thinners. Patient is not being treated for MRSA or C-diff. Patient is aware of our care-partner policy and OZYYQ-82 safety protocol. EMMI education assigned to the patient for the procedure, sent to [place].   Patient is COVID-19 vaccinated, per patient.   Pt request Sutab/pills. She was unable to drink all of the prep last colon. Prep Prescription coupon given to the patient.

## 2021-02-21 DIAGNOSIS — H353132 Nonexudative age-related macular degeneration, bilateral, intermediate dry stage: Secondary | ICD-10-CM | POA: Diagnosis not present

## 2021-02-21 DIAGNOSIS — H16223 Keratoconjunctivitis sicca, not specified as Sjogren's, bilateral: Secondary | ICD-10-CM | POA: Diagnosis not present

## 2021-02-21 DIAGNOSIS — H3554 Dystrophies primarily involving the retinal pigment epithelium: Secondary | ICD-10-CM | POA: Diagnosis not present

## 2021-02-21 DIAGNOSIS — H35363 Drusen (degenerative) of macula, bilateral: Secondary | ICD-10-CM | POA: Diagnosis not present

## 2021-03-06 ENCOUNTER — Encounter: Payer: Self-pay | Admitting: Gastroenterology

## 2021-03-06 ENCOUNTER — Other Ambulatory Visit: Payer: Self-pay

## 2021-03-06 ENCOUNTER — Ambulatory Visit (AMBULATORY_SURGERY_CENTER): Payer: Medicare Other | Admitting: Gastroenterology

## 2021-03-06 VITALS — BP 134/59 | HR 64 | Temp 96.2°F | Resp 16 | Ht 63.0 in | Wt 151.0 lb

## 2021-03-06 DIAGNOSIS — D124 Benign neoplasm of descending colon: Secondary | ICD-10-CM | POA: Diagnosis not present

## 2021-03-06 DIAGNOSIS — I10 Essential (primary) hypertension: Secondary | ICD-10-CM | POA: Diagnosis not present

## 2021-03-06 DIAGNOSIS — Z800 Family history of malignant neoplasm of digestive organs: Secondary | ICD-10-CM

## 2021-03-06 DIAGNOSIS — Z86010 Personal history of colonic polyps: Secondary | ICD-10-CM

## 2021-03-06 DIAGNOSIS — E785 Hyperlipidemia, unspecified: Secondary | ICD-10-CM | POA: Diagnosis not present

## 2021-03-06 DIAGNOSIS — Z8371 Family history of colonic polyps: Secondary | ICD-10-CM

## 2021-03-06 DIAGNOSIS — Z83719 Family history of colon polyps, unspecified: Secondary | ICD-10-CM

## 2021-03-06 DIAGNOSIS — Z860101 Personal history of adenomatous and serrated colon polyps: Secondary | ICD-10-CM

## 2021-03-06 DIAGNOSIS — I2510 Atherosclerotic heart disease of native coronary artery without angina pectoris: Secondary | ICD-10-CM | POA: Diagnosis not present

## 2021-03-06 MED ORDER — SODIUM CHLORIDE 0.9 % IV SOLN: 500.0000 mL | Freq: Once | INTRAVENOUS | Status: DC

## 2021-03-06 MED ORDER — DICYCLOMINE HCL 10 MG PO CAPS: 10.0000 mg | ORAL_CAPSULE | Freq: Two times a day (BID) | ORAL | 11 refills | Status: AC | PRN

## 2021-03-06 NOTE — Progress Notes (Signed)
Called to room to assist during endoscopic procedure.  Patient ID and intended procedure confirmed with present staff. Received instructions for my participation in the procedure from the performing physician.  

## 2021-03-06 NOTE — Progress Notes (Signed)
Report to PACU, RN, vss, BBS= Clear.  

## 2021-03-06 NOTE — Patient Instructions (Signed)
Discharge instructions given. Handouts on polyps and Diverticulosis. Resume previous medications. YOU HAD AN ENDOSCOPIC PROCEDURE TODAY AT [facility]:   Refer to the procedure report that was given to you for any specific questions about what was found during the examination.  If the procedure report does not answer your questions, please call your gastroenterologist to clarify.  If you requested that your care partner not be given the details of your procedure findings, then the procedure report has been included in a sealed envelope for you to review at your convenience later.  YOU SHOULD EXPECT: Some feelings of bloating in the abdomen. Passage of more gas than usual.  Walking can help get rid of the air that was put into your GI tract during the procedure and reduce the bloating. If you had a lower endoscopy (such as a colonoscopy or flexible sigmoidoscopy) you may notice spotting of blood in your stool or on the toilet paper. If you underwent a bowel prep for your procedure, you may not have a normal bowel movement for a few days.  Please Note:  You might notice some irritation and congestion in your nose or some drainage.  This is from the oxygen used during your procedure.  There is no need for concern and it should clear up in a day or so.  SYMPTOMS TO REPORT IMMEDIATELY:   Following lower endoscopy (colonoscopy or flexible sigmoidoscopy):  Excessive amounts of blood in the stool  Significant tenderness or worsening of abdominal pains  Swelling of the abdomen that is new, acute  Fever of 100F or higher   For urgent or emergent issues, a gastroenterologist can be reached at any hour by calling [phone]. Do not use MyChart messaging for urgent concerns.    DIET:  We do recommend a small meal at first, but then you may proceed to your regular diet.  Drink plenty of fluids but you should avoid alcoholic beverages for 24 hours.  ACTIVITY:  You should plan to take  it easy for the rest of today and you should NOT DRIVE or use heavy machinery until tomorrow (because of the sedation medicines used during the test).    FOLLOW UP: Our staff will call the number listed on your records 48-72 hours following your procedure to check on you and address any questions or concerns that you may have regarding the information given to you following your procedure. If we do not reach you, we will leave a message.  We will attempt to reach you two times.  During this call, we will ask if you have developed any symptoms of COVID 19. If you develop any symptoms (ie: fever, flu-like symptoms, shortness of breath, cough etc.) before then, please call [phone].  If you test positive for Covid 19 in the 2 weeks post procedure, please call and report this information to [place].    If any biopsies were taken you will be contacted by phone or by letter within the next 1-3 weeks.  Please call us at [phone] if you have not heard about the biopsies in 3 weeks.    SIGNATURES/CONFIDENTIALITY: You and/or your care partner have signed paperwork which will be entered into your electronic medical record.  These signatures attest to the fact that that the information above on your After Visit Summary has been reviewed and is understood.  Full responsibility of the confidentiality of this discharge information lies with you and/or your care-partner.

## 2021-03-06 NOTE — Op Note (Signed)
[facility] Patient Name: Valerie Nash Procedure Date: 03/06/2021 9:23 AM MRN: [id number] Endoscopist: Ladene Artist , MD Age: 75 Referring MD:  Date of Birth: 1946-01-31 Gender: Female Account #: [id number] Procedure:                Colonoscopy Indications:              Screening in patient at increased risk: Family                            history of 1st-degree relative with colorectal                            cancer and a 1st-degree relative with colon polyp.                            Personal history of adenomatous colon polyps in                            2004. Medicines:                Monitored Anesthesia Care Procedure:                Pre-Anesthesia Assessment:                           - Prior to the procedure, a History and Physical                            was performed, and patient medications and                            allergies were reviewed. The patient's tolerance of                            previous anesthesia was also reviewed. The risks                            and benefits of the procedure and the sedation                            options and risks were discussed with the patient.                            All questions were answered, and informed consent                            was obtained. Prior Anticoagulants: The patient has                            taken no previous anticoagulant or antiplatelet                            agents. ASA Grade Assessment: III - A patient with  severe systemic disease. After reviewing the risks                            and benefits, the patient was deemed in                            satisfactory condition to undergo the procedure.                           After obtaining informed consent, the colonoscope                            was passed under direct vision. Throughout the                            procedure, the patient's blood pressure, pulse, and                             oxygen saturations were monitored continuously. The                            Olympus PCF-H190DL ([id number]) Colonoscope was                            introduced through the anus and advanced to the the                            cecum, identified by appendiceal orifice and                            ileocecal valve. The ileocecal valve, appendiceal                            orifice, and rectum were photographed. The quality                            of the bowel preparation was adequate. The                            colonoscopy was performed without difficulty. The                            patient tolerated the procedure well. Scope In: 9:29:51 AM Scope Out: 9:48:36 AM Scope Withdrawal Time: 0 hours 13 minutes 23 seconds  Total Procedure Duration: 0 hours 18 minutes 45 seconds  Findings:                 The perianal and digital rectal examinations were                            normal.                           A 5 mm polyp was found in the descending colon. The  polyp was sessile. The polyp was removed with a                            cold snare. Resection and retrieval were complete.                           A few small-mouthed diverticula were found in the                            left colon.                           The exam was otherwise without abnormality on                            direct and retroflexion views. Complications:            No immediate complications. Estimated blood loss:                            None. Estimated Blood Loss:     Estimated blood loss: none. Impression:               - One 5 mm polyp in the descending colon, removed                            with a cold snare. Resected and retrieved.                           - Mild diverticulosis in the left colon.                           - The examination was otherwise normal on direct                            and retroflexion views. Recommendation:            - Patient has a contact number available for                            emergencies. The signs and symptoms of potential                            delayed complications were discussed with the                            patient. Return to normal activities tomorrow.                            Written discharge instructions were provided to the                            patient.                           - Resume previous diet.                           -  Continue present medications.                           - Await pathology results.                           - No repeat colonoscopy due to age. Ladene Artist, MD 03/06/2021 9:53:23 AM This report has been signed electronically.

## 2021-03-06 NOTE — Progress Notes (Signed)
Medical history reviewed with no changes noted. VS assessed by S.B

## 2021-03-11 ENCOUNTER — Telehealth: Payer: Self-pay

## 2021-03-11 ENCOUNTER — Telehealth: Payer: Self-pay | Admitting: *Deleted

## 2021-03-11 NOTE — Telephone Encounter (Signed)
  Follow up Call-  No flowsheet data found.   Patient questions:  Do you have a fever, pain , or abdominal swelling? No. Pain Score  0 *  Have you tolerated food without any problems? Yes.    Have you been able to return to your normal activities? Yes.    Do you have any questions about your discharge instructions: Diet   No. Medications  No. Follow up visit  No.  Do you have questions or concerns about your Care? No.  Actions: * If pain score is 4 or above: No action needed, pain <4.  1. Have you developed a fever since your procedure? no  2.   Have you had an respiratory symptoms (SOB or cough) since your procedure? no  3.   Have you tested positive for COVID 19 since your procedure no  4.   Have you had any family members/close contacts diagnosed with the COVID 19 since your procedure?  no   If yes to any of these questions please route to Tracy Walton, RN and Denise Buckner, RN   

## 2021-03-11 NOTE — Telephone Encounter (Signed)
Phone call to pt for follow up call from procedure 03/06/21.  The screen did not say if we could leave a message or not.  The answering machine picked up, but I did not leave a messaqe. maw

## 2021-03-20 ENCOUNTER — Encounter: Payer: Self-pay | Admitting: Gastroenterology

## 2021-04-14 NOTE — Progress Notes (Signed)
Cardiology Office Note   Date:  04/15/2021   ID:  Valerie Nash, DOB [birth year], MRN [id number]  PCP:  Josetta Huddle, MD  Cardiologist:   Minus Breeding, MD    Chief Complaint  Patient presents with   Cardiomyopathy       History of Present Illness: Valerie Nash is a 75 y.o. 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  She was admitted 12/2016 with elevated enzymes with 2D echo showing EF 15-20% (previously recovered to 57% in 2014), also showed grade 2 DD, mild AI/MR, mod TR/PR, PASP 46mmHg (severe pulm HTN). She underwent LHC 12/14/16 showing normal coronary arteries and fairly normal filling pressures including a PA pressure of 30 systolic, and findings c/w pulmonary HTN per report. Her presentation was felt again due to Takotsubo CM. She also had intermittent episodes of short bursts of nonsustained atrial tachycardia and was therefore continued on her BB. She developed an ACE cough and was changed to an ARB.    Her most recent echo in April demonstrated an EF of 60 to 65%.  There was some mild right ventricular dysfunction but no evidence of pulmonary hypertension.  Since I last saw her she has done well.  She exercises 3 times a week and she walks very little with no every day briskly.  The patient denies any new symptoms such as chest discomfort, neck or arm discomfort. There has been no new shortness of breath, PND or orthopnea. There have been no reported palpitations, presyncope or syncope.  Her diastolic blood pressures have been elevated.    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 first noted on tele 12/2016 during admission for Takotsubo.   Pulmonary hypertension ([place])    a. Dx 12/2016 in setting of Takotsubo cardiomyopathy, EF 10-15%.   RBBB (right bundle branch block with left anterior fascicular block)    Sinusitis    Takotsubo cardiomyopathy 2014, 2018   a. in 2014 -> EF improved shortly after to 57%. b. recurrent Takotsubo in 12/2016 with EF down to 10-15%, normal coronaries.    Past Surgical History:  Procedure Laterality Date   ABDOMINAL HYSTERECTOMY     BREAST LUMPECTOMY     CESAREAN SECTION     COLONOSCOPY  09/03/2015   [facility]   LEFT HEART CATHETERIZATION WITH CORONARY ANGIOGRAM Bilateral 12/26/2012   Procedure: LEFT HEART CATHETERIZATION WITH CORONARY ANGIOGRAM;  Surgeon: Sueanne Margarita, MD;  Location: [facility];  Service: Cardiovascular;  Laterality: Bilateral;   NASAL SEPTOPLASTY W/ TURBINOPLASTY Bilateral 08/24/2018   Procedure: NASAL SEPTOPLASTY WITH TURBINATE REDUCTION;  Surgeon: Jerrell Belfast, MD;  Location: [facility];  Service: ENT;  Laterality: Bilateral;   RIGHT/LEFT HEART CATH AND CORONARY ANGIOGRAPHY N/A 12/14/2016   Procedure: Right/Left Heart Cath and Coronary Angiography;  Surgeon: Lorretta Harp, MD;  Location: [facility];  Service: Cardiovascular;  Laterality: N/A;   SHOULDER SURGERY     UPPER GASTROINTESTINAL ENDOSCOPY     [facility]     Current Outpatient  Medications  Medication Sig Dispense Refill   acetaminophen (TYLENOL) 500 MG tablet Take 1,000 mg by mouth daily as needed for headache (pain).     Apoaequorin (PREVAGEN PO) Take by mouth daily.     carvedilol (COREG) 6.25 MG tablet TAKE 1 TABLET(6.25 MG) BY MOUTH TWICE DAILY 180 tablet 3   Cholecalciferol (VITAMIN D-3) 5000 units TABS Take 5,000 Units by mouth daily.     ezetimibe (ZETIA) 10 MG tablet Take 10 mg by mouth at bedtime.     Multiple Vitamins-Minerals (PRESERVISION AREDS PO) Take by mouth.     Polyethyl Glycol-Propyl Glycol 0.4-0.3 % SOLN  Place 1 drop into both eyes 3 (three) times daily as needed (for dry/irritated eyes.).     venlafaxine XR (EFFEXOR-XR) 75 MG 24 hr capsule Take 75 mg by mouth daily.     dicyclomine (BENTYL) 10 MG capsule Take 1 capsule (10 mg total) by mouth 2 (two) times daily as needed for spasms. (Patient not taking: Reported on 04/15/2021) 60 capsule 11   losartan (COZAAR) 50 MG tablet Take 1 tablet (50 mg total) by mouth in the morning and at bedtime. 180 tablet 3   nitroGLYCERIN (NITROSTAT) 0.4 MG SL tablet Place 1 tablet (0.4 mg total) under the tongue every 5 (five) minutes x 3 doses as needed for chest pain. (Patient not taking: No sig reported) 25 tablet 12   No current facility-administered medications for this visit.    Allergies:   Ace inhibitors, Pravastatin, Ambien [zolpidem tartrate], Topamax [topiramate], and Simvastatin    ROS:  Please see the history of present illness.   Otherwise, review of systems are positive for none..   All other systems are reviewed and negative.    PHYSICAL EXAM: VS:  BP 122/80 (BP Location: Left Arm, Patient Position: Sitting, Cuff Size: Normal)   Pulse 68   Ht 5' 3.75" (1.619 m)   Wt 151 lb 6.4 oz (68.7 kg)   LMP  (LMP Unknown)   SpO2 99%   BMI 26.19 kg/m  , BMI Body mass index is 26.19 kg/m.  GENERAL:  Well appearing NECK:  No jugular venous distention, waveform within normal limits, carotid upstroke brisk and symmetric, no bruits, no thyromegaly LUNGS:  Clear to auscultation bilaterally CHEST:  Unremarkable HEART:  PMI not displaced or sustained,S1 and S2 within normal limits, no S3, no S4, no clicks, no rubs, no murmurs ABD:  Flat, positive bowel sounds normal in frequency in pitch, no bruits, no rebound, no guarding, no midline pulsatile mass, no hepatomegaly, no splenomegaly EXT:  2 plus pulses throughout, no edema, no cyanosis no clubbing  EKG:  EKG is  ordered today. EKG today with  normal sinus rhythm, rate 68, right bundle branch block, left  anterior fascicular block, no change from previous.   Recent Labs: No results found for requested labs within last 8760 hours.    Lipid Panel    Component Value Date/Time   CHOL 209 (H) 12/14/2016 0441   TRIG 96 12/14/2016 0441   HDL 55 12/14/2016 0441   CHOLHDL 3.8 12/14/2016 0441   VLDL 19 12/14/2016 0441   LDLCALC 135 (H) 12/14/2016 0441   LDLDIRECT 143.3 08/28/2013 1053      Wt Readings from Last 3 Encounters:  04/15/21 151 lb 6.4 oz (68.7 kg)  03/06/21 151 lb (68.5 kg)  02/20/21 151 lb (68.5 kg)      Other studies Reviewed: Additional studies/ records that were reviewed today include: Labs  Review of the above  records demonstrates: See elsewhere   ASSESSMENT AND PLAN:   TAKOTSUBO CM: Her ejection fraction recovered to normal on follow up echo in 2018.  She has had no symptoms suggestive of reduced ejection fraction or recurrent event.  I will continue to manage her medically with med adjustments as below.   HTN:    Her blood pressures are elevated and her diastolic.  We correlated our readings with her machine and checked her blood pressure in both arms.  I am going to increase her losartan to 50 mg twice daily.   BIFASICULAR BLOCK:    She has no symptoms of presyncope or syncope.  She is aware of her EKG findings.  No change in therapy.   DYSLIPIDEMIA: Total cholesterol was 219 with an LDL of 132.  She does not have coronary disease on her most recent cath in 2018 so I think dietary management is in order in addition to the Zetia that she is taking.  I do not think a statin is indicated but we did talk about diet.   Current medicines are reviewed at length with the patient today.  The patient does not have concerns regarding medicines.  The following changes have been made:  None  Labs/ tests ordered today include:  None  Orders Placed This Encounter  Procedures   EKG 12-Lead      Disposition:   FU with me in 12 months.    Signed, Minus Breeding, MD   04/15/2021 3:40 PM    [facility]

## 2021-04-15 ENCOUNTER — Encounter: Payer: Self-pay | Admitting: Cardiology

## 2021-04-15 ENCOUNTER — Other Ambulatory Visit: Payer: Self-pay

## 2021-04-15 ENCOUNTER — Ambulatory Visit (INDEPENDENT_AMBULATORY_CARE_PROVIDER_SITE_OTHER): Payer: Medicare Other | Admitting: Cardiology

## 2021-04-15 VITALS — BP 122/80 | HR 68 | Ht 63.75 in | Wt 151.4 lb

## 2021-04-15 DIAGNOSIS — I5181 Takotsubo syndrome: Secondary | ICD-10-CM | POA: Diagnosis not present

## 2021-04-15 DIAGNOSIS — I10 Essential (primary) hypertension: Secondary | ICD-10-CM

## 2021-04-15 MED ORDER — LOSARTAN POTASSIUM 50 MG PO TABS: 50.0000 mg | ORAL_TABLET | Freq: Two times a day (BID) | ORAL | 3 refills | Status: DC

## 2021-04-15 NOTE — Patient Instructions (Signed)
Medication Instructions:  INCREASE Losartan to 50 mg two times daily  *If you need a refill on your cardiac medications before your next appointment, please call your pharmacy*  Follow-Up: At [facility], you and your health needs are our priority.  As part of our continuing mission to provide you with exceptional heart care, we have created designated Provider Care Teams.  These Care Teams include your primary Cardiologist (physician) and Advanced Practice Providers (APPs -  Physician Assistants and Nurse Practitioners) who all work together to provide you with the care you need, when you need it.  We recommend signing up for the patient portal called "MyChart".  Sign up information is provided on this After Visit Summary.  MyChart is used to connect with patients for Virtual Visits (Telemedicine).  Patients are able to view lab/test results, encounter notes, upcoming appointments, etc.  Non-urgent messages can be sent to your provider as well.   To learn more about what you can do with MyChart, go to [email or web address].    Your next appointment:   12 month(s)  The format for your next appointment:   In Person  Provider:   Minus Breeding, MD

## 2021-05-30 DIAGNOSIS — R21 Rash and other nonspecific skin eruption: Secondary | ICD-10-CM | POA: Diagnosis not present

## 2021-05-31 DIAGNOSIS — L03211 Cellulitis of face: Secondary | ICD-10-CM | POA: Diagnosis not present

## 2021-05-31 DIAGNOSIS — B028 Zoster with other complications: Secondary | ICD-10-CM | POA: Diagnosis not present

## 2021-05-31 DIAGNOSIS — A46 Erysipelas: Secondary | ICD-10-CM | POA: Diagnosis not present

## 2021-05-31 DIAGNOSIS — B372 Candidiasis of skin and nail: Secondary | ICD-10-CM | POA: Diagnosis not present

## 2021-06-04 DIAGNOSIS — R21 Rash and other nonspecific skin eruption: Secondary | ICD-10-CM | POA: Diagnosis not present

## 2021-06-09 DIAGNOSIS — R21 Rash and other nonspecific skin eruption: Secondary | ICD-10-CM | POA: Diagnosis not present

## 2021-06-09 DIAGNOSIS — I10 Essential (primary) hypertension: Secondary | ICD-10-CM | POA: Diagnosis not present

## 2021-06-24 ENCOUNTER — Other Ambulatory Visit: Payer: Self-pay | Admitting: Cardiology

## 2021-07-21 DIAGNOSIS — Z23 Encounter for immunization: Secondary | ICD-10-CM | POA: Diagnosis not present

## 2021-08-07 DIAGNOSIS — M25561 Pain in right knee: Secondary | ICD-10-CM | POA: Diagnosis not present

## 2021-09-10 ENCOUNTER — Encounter: Payer: Self-pay | Admitting: Cardiology

## 2021-09-10 MED ORDER — CHLORTHALIDONE 25 MG PO TABS: 12.5000 mg | ORAL_TABLET | Freq: Every day | ORAL | 3 refills | Status: DC

## 2021-09-10 NOTE — Addendum Note (Signed)
Addended by: Rockne Menghini on: 09/10/2021 02:25 PM   Modules accepted: Orders

## 2021-09-10 NOTE — Telephone Encounter (Signed)
Agree that rise in BP may be due to infection and stress.  Take the cephalexin and if BP does not come down after infection cleared, then we can look at options

## 2021-10-14 DIAGNOSIS — Z23 Encounter for immunization: Secondary | ICD-10-CM | POA: Diagnosis not present

## 2021-10-15 DIAGNOSIS — R7303 Prediabetes: Secondary | ICD-10-CM | POA: Diagnosis not present

## 2021-10-15 DIAGNOSIS — I5181 Takotsubo syndrome: Secondary | ICD-10-CM | POA: Diagnosis not present

## 2021-10-15 DIAGNOSIS — I10 Essential (primary) hypertension: Secondary | ICD-10-CM | POA: Diagnosis not present

## 2021-10-15 DIAGNOSIS — F419 Anxiety disorder, unspecified: Secondary | ICD-10-CM | POA: Diagnosis not present

## 2021-10-15 DIAGNOSIS — K589 Irritable bowel syndrome without diarrhea: Secondary | ICD-10-CM | POA: Diagnosis not present

## 2021-10-15 DIAGNOSIS — Z853 Personal history of malignant neoplasm of breast: Secondary | ICD-10-CM | POA: Diagnosis not present

## 2021-10-15 DIAGNOSIS — I2510 Atherosclerotic heart disease of native coronary artery without angina pectoris: Secondary | ICD-10-CM | POA: Diagnosis not present

## 2021-10-15 DIAGNOSIS — I452 Bifascicular block: Secondary | ICD-10-CM | POA: Diagnosis not present

## 2021-10-15 DIAGNOSIS — R7309 Other abnormal glucose: Secondary | ICD-10-CM | POA: Diagnosis not present

## 2021-10-15 DIAGNOSIS — Z7189 Other specified counseling: Secondary | ICD-10-CM | POA: Diagnosis not present

## 2021-10-15 DIAGNOSIS — Z0000 Encounter for general adult medical examination without abnormal findings: Secondary | ICD-10-CM | POA: Diagnosis not present

## 2021-10-15 DIAGNOSIS — E785 Hyperlipidemia, unspecified: Secondary | ICD-10-CM | POA: Diagnosis not present

## 2021-10-15 DIAGNOSIS — Z79899 Other long term (current) drug therapy: Secondary | ICD-10-CM | POA: Diagnosis not present

## 2021-10-15 DIAGNOSIS — Z1389 Encounter for screening for other disorder: Secondary | ICD-10-CM | POA: Diagnosis not present

## 2021-11-10 DIAGNOSIS — Z1231 Encounter for screening mammogram for malignant neoplasm of breast: Secondary | ICD-10-CM | POA: Diagnosis not present

## 2021-11-13 ENCOUNTER — Other Ambulatory Visit (INDEPENDENT_AMBULATORY_CARE_PROVIDER_SITE_OTHER): Payer: Medicare Other

## 2021-11-13 ENCOUNTER — Encounter: Payer: Self-pay | Admitting: Gastroenterology

## 2021-11-13 ENCOUNTER — Ambulatory Visit (INDEPENDENT_AMBULATORY_CARE_PROVIDER_SITE_OTHER): Payer: Medicare Other | Admitting: Gastroenterology

## 2021-11-13 VITALS — BP 110/68 | HR 62 | Ht 63.75 in | Wt 151.0 lb

## 2021-11-13 DIAGNOSIS — R1013 Epigastric pain: Secondary | ICD-10-CM

## 2021-11-13 DIAGNOSIS — Z86010 Personal history of colonic polyps: Secondary | ICD-10-CM

## 2021-11-13 DIAGNOSIS — E785 Hyperlipidemia, unspecified: Secondary | ICD-10-CM | POA: Insufficient documentation

## 2021-11-13 DIAGNOSIS — K625 Hemorrhage of anus and rectum: Secondary | ICD-10-CM | POA: Insufficient documentation

## 2021-11-13 DIAGNOSIS — R194 Change in bowel habit: Secondary | ICD-10-CM | POA: Insufficient documentation

## 2021-11-13 DIAGNOSIS — K589 Irritable bowel syndrome without diarrhea: Secondary | ICD-10-CM | POA: Insufficient documentation

## 2021-11-13 DIAGNOSIS — Z853 Personal history of malignant neoplasm of breast: Secondary | ICD-10-CM | POA: Insufficient documentation

## 2021-11-13 LAB — CREATININE, SERUM: Creatinine, Ser: 0.7 mg/dL (ref 0.40–1.20)

## 2021-11-13 LAB — BUN: BUN: 13 mg/dL (ref 6–23)

## 2021-11-13 NOTE — Progress Notes (Signed)
° ° °  History of Present Illness: This is a 76 year old female who relates lower abdominal pressure 1 month ago that resolved and now epigastric pain, heaviness for 2-3 weeks.  Her epigastric pain is described as a heaviness, dull, full sensation. It worsens with sitting and is improved with standing.  Occasionally it is worsened with salads and other foods but often no change with foods or beverages.  Symptoms began this gradually. CBC, CMP, TSH on 10/15/2021 were normal. Denies weight loss, constipation, diarrhea, change in stool caliber, melena, hematochezia, nausea, vomiting, dysphagia, reflux symptoms, chest pain.   Current Medications, Allergies, Past Medical History, Past Surgical History, Family History and Social History were reviewed in [employer] record.   Physical Exam: General: Well developed, well nourished, no acute distress Head: Normocephalic and atraumatic Eyes: Sclerae anicteric, EOMI Ears: Normal auditory acuity Mouth: Not examined, mask on during Covid-19 pandemic Lungs: Clear throughout to auscultation Heart: Regular rate and rhythm; no murmurs, rubs or bruits Abdomen: Soft, mild epigastric tenderness and non distended. No masses, hepatosplenomegaly or hernias noted. Normal Bowel sounds Rectal: Not done Musculoskeletal: Symmetrical with no gross deformities  Pulses:  Normal pulses noted Extremities: No clubbing, cyanosis, edema or deformities noted Neurological: Alert oriented x 4, grossly nonfocal Psychological:  Alert and cooperative. Normal mood and affect   Assessment and Recommendations:  Epigastric pain, heavy, dull, often positional, sometimes exacerbated by food.  Etiology unclear.  Schedule CT AP and EGD. The risks (including bleeding, perforation, infection, missed lesions, medication reactions and possible hospitalization or surgery if complications occur), benefits, and alternatives to endoscopy with possible biopsy and possible  dilation were discussed with the patient and they consent to proceed.   Personal history of adenomatous colon polyp and family history of colon cancer.  Last colonoscopy performed in May 2022 with 1 small adenomatous colon polyp.  Future surveillance colonoscopies are not recommended due to age.

## 2021-11-13 NOTE — Patient Instructions (Signed)
Your provider has requested that you go to the basement level for lab work before leaving today. Press "B" on the elevator. The lab is located at the first door on the left as you exit the elevator.  You have been scheduled for an endoscopy. Please follow written instructions given to you at your visit today. If you use inhalers (even only as needed), please bring them with you on the day of your procedure.  You have been scheduled for a CT scan of the abdomen and pelvis at [facility], 1st floor Radiology. You are scheduled on 11/14/21 at 5:30am. You should arrive 30 minutes prior to your appointment time for registration.  The solution may taste better if refrigerated, but do NOT add ice or any other liquid to this solution. Shake well before drinking.   Please follow the written instructions below on the day of your exam:   1) Do not eat anything after 1:30pm (4 hours prior to your test)   2) Drink 1 bottle of contrast @ 3:30pm (2 hours prior to your exam)  Remember to shake well before drinking and do NOT pour over ice.     Drink 1 bottle of contrast @ 4:30pm (1 hour prior to your exam)   You may take any medications as prescribed with a small amount of water, if necessary. If you take any of the following medications: METFORMIN, GLUCOPHAGE, GLUCOVANCE, AVANDAMET, RIOMET, FORTAMET, Nambe MET, JANUMET, GLUMETZA or METAGLIP, you MAY be asked to HOLD this medication 48 hours AFTER the exam.   The purpose of you drinking the oral contrast is to aid in the visualization of your intestinal tract. The contrast solution may cause some diarrhea. Depending on your individual set of symptoms, you may also receive an intravenous injection of x-ray contrast/dye. Plan on being at [facility] for 45 minutes or longer, depending on the type of exam you are having performed.   If you have any questions regarding your exam or if you need to reschedule, you may call [facility] at [phone]  between the hours of 8:00 am and 5:00 pm, Monday-Friday.   The [place] [facility] providers would like to encourage you to use [facility] to communicate with providers for non-urgent requests or questions.  Due to long hold times on the telephone, sending your provider a message by [facility] may be a faster and more efficient way to get a response.  Please allow 48 business hours for a response.  Please remember that this is for non-urgent requests.   Due to recent changes in healthcare laws, you may see the results of your imaging and laboratory studies on MyChart before your provider has had a chance to review them.  We understand that in some cases there may be results that are confusing or concerning to you. Not all laboratory results come back in the same time frame and the provider may be waiting for multiple results in order to interpret others.  Please give [place] 48 hours in order for your provider to thoroughly review all the results before contacting the office for clarification of your results.   Thank you for choosing me and [facility].  Pricilla Riffle. Dagoberto Ligas., MD., [facility]

## 2021-11-14 ENCOUNTER — Other Ambulatory Visit: Payer: Self-pay

## 2021-11-14 ENCOUNTER — Ambulatory Visit (HOSPITAL_COMMUNITY)
Admission: RE | Admit: 2021-11-14 | Discharge: 2021-11-14 | Disposition: A | Discharge status: Home / Self Care | Payer: Medicare Other | Source: Ambulatory Visit | Attending: Gastroenterology | Admitting: Gastroenterology

## 2021-11-14 DIAGNOSIS — R109 Unspecified abdominal pain: Secondary | ICD-10-CM | POA: Diagnosis not present

## 2021-11-14 DIAGNOSIS — R1013 Epigastric pain: Secondary | ICD-10-CM | POA: Insufficient documentation

## 2021-11-14 DIAGNOSIS — I700 Atherosclerosis of aorta: Secondary | ICD-10-CM | POA: Diagnosis not present

## 2021-11-14 MED ORDER — IOHEXOL 300 MG/ML  SOLN
80.0000 mL | Freq: Once | INTRAMUSCULAR | Status: AC | PRN
  Administered 2021-11-14: 75 mL via INTRAVENOUS

## 2021-11-17 ENCOUNTER — Telehealth: Payer: Self-pay | Admitting: Gastroenterology

## 2021-11-17 ENCOUNTER — Encounter: Payer: Self-pay | Admitting: Certified Registered Nurse Anesthetist

## 2021-11-17 NOTE — Telephone Encounter (Signed)
See result note.  

## 2021-11-17 NOTE — Telephone Encounter (Signed)
Patient called wanting to know if she still needs to come in tomorrow for her procedure. Stated that Dr. Fuller Plan said that depending on what her MRI read she may not need it. Seeking advice, please advise.

## 2021-11-17 NOTE — Telephone Encounter (Signed)
Patient said she received her lab results and wanted to make sure she is still supposed to come for her procedure tomorrow.  Please call her ASAP so she can let her ride know.  Thank you.

## 2021-11-18 ENCOUNTER — Encounter: Payer: Self-pay | Admitting: Gastroenterology

## 2021-11-18 ENCOUNTER — Ambulatory Visit (AMBULATORY_SURGERY_CENTER): Payer: Medicare Other | Admitting: Gastroenterology

## 2021-11-18 VITALS — BP 120/64 | HR 66 | Temp 96.8°F | Resp 20 | Ht 63.75 in | Wt 151.0 lb

## 2021-11-18 DIAGNOSIS — K2281 Esophageal polyp: Secondary | ICD-10-CM

## 2021-11-18 DIAGNOSIS — K2970 Gastritis, unspecified, without bleeding: Secondary | ICD-10-CM

## 2021-11-18 DIAGNOSIS — I2510 Atherosclerotic heart disease of native coronary artery without angina pectoris: Secondary | ICD-10-CM | POA: Diagnosis not present

## 2021-11-18 DIAGNOSIS — K2950 Unspecified chronic gastritis without bleeding: Secondary | ICD-10-CM | POA: Diagnosis not present

## 2021-11-18 DIAGNOSIS — D130 Benign neoplasm of esophagus: Secondary | ICD-10-CM | POA: Diagnosis not present

## 2021-11-18 DIAGNOSIS — R1013 Epigastric pain: Secondary | ICD-10-CM | POA: Diagnosis not present

## 2021-11-18 DIAGNOSIS — I252 Old myocardial infarction: Secondary | ICD-10-CM | POA: Diagnosis not present

## 2021-11-18 MED ORDER — SODIUM CHLORIDE 0.9 % IV SOLN: 500.0000 mL | Freq: Once | INTRAVENOUS | Status: DC

## 2021-11-18 MED ORDER — PANTOPRAZOLE SODIUM 40 MG PO TBEC: 40.0000 mg | DELAYED_RELEASE_TABLET | Freq: Every day | ORAL | 3 refills | Status: AC

## 2021-11-18 NOTE — Progress Notes (Signed)
Report given to PACU, vss 

## 2021-11-18 NOTE — Progress Notes (Signed)
Called to room to assist during endoscopic procedure.  Patient ID and intended procedure confirmed with present staff. Received instructions for my participation in the procedure from the performing physician.  

## 2021-11-18 NOTE — Patient Instructions (Signed)
Thank you for allowing [place] to care for you today! Await final biopsy results, approximately 2 weeks. Prescription for Pantoprazole has been sent to your pharmacy.  You will take this once a day before your first meal. Minimize NSAID usage (Ibuprofen, Aspirin, Aleve) Return to GI clinic in 6 weeks.  We will call to arrange this appointment.    YOU HAD AN ENDOSCOPIC PROCEDURE TODAY AT [facility]:   Refer to the procedure report that was given to you for any specific questions about what was found during the examination.  If the procedure report does not answer your questions, please call your gastroenterologist to clarify.  If you requested that your care partner not be given the details of your procedure findings, then the procedure report has been included in a sealed envelope for you to review at your convenience later.  YOU SHOULD EXPECT: Some feelings of bloating in the abdomen. Passage of more gas than usual.  Walking can help get rid of the air that was put into your GI tract during the procedure and reduce the bloating. If you had a lower endoscopy (such as a colonoscopy or flexible sigmoidoscopy) you may notice spotting of blood in your stool or on the toilet paper. If you underwent a bowel prep for your procedure, you may not have a normal bowel movement for a few days.  Please Note:  You might notice some irritation and congestion in your nose or some drainage.  This is from the oxygen used during your procedure.  There is no need for concern and it should clear up in a day or so.  SYMPTOMS TO REPORT IMMEDIATELY:    Following upper endoscopy (EGD)  Vomiting of blood or coffee ground material  New chest pain or pain under the shoulder blades  Painful or persistently difficult swallowing  New shortness of breath  Fever of 100F or higher  Black, tarry-looking stools  For urgent or emergent issues, a gastroenterologist can be reached at any hour by calling [phone]  [phone]. Do not use MyChart messaging for urgent concerns.    DIET:  We do recommend a small meal at first, but then you may proceed to your regular diet.  Drink plenty of fluids but you should avoid alcoholic beverages for 24 hours.  ACTIVITY:  You should plan to take it easy for the rest of today and you should NOT DRIVE or use heavy machinery until tomorrow (because of the sedation medicines used during the test).    FOLLOW UP: Our staff will call the number listed on your records 48-72 hours following your procedure to check on you and address any questions or concerns that you may have regarding the information given to you following your procedure. If we do not reach you, we will leave a message.  We will attempt to reach you two times.  During this call, we will ask if you have developed any symptoms of COVID 19. If you develop any symptoms (ie: fever, flu-like symptoms, shortness of breath, cough etc.) before then, please call [phone].  If you test positive for Covid 19 in the 2 weeks post procedure, please call and report this information to [place].    If any biopsies were taken you will be contacted by phone or by letter within the next 1-3 weeks.  Please call us at [phone] if you have not heard about the biopsies in 3 weeks.    SIGNATURES/CONFIDENTIALITY: You and/or your care partner have signed  paperwork which will be entered into your electronic medical record.  These signatures attest to the fact that that the information above on your After Visit Summary has been reviewed and is understood.  Full responsibility of the confidentiality of this discharge information lies with you and/or your care-partner.

## 2021-11-18 NOTE — Progress Notes (Signed)
See 11/13/2021 H&P, no changes.  °

## 2021-11-18 NOTE — Progress Notes (Signed)
1034 Robinul 0.1 mg IV given due large amount of secretions upon assessment.  MD made aware, vss 

## 2021-11-18 NOTE — Op Note (Signed)
[facility] Patient Name: Valerie Nash Procedure Date: 11/18/2021 10:27 AM MRN: [id number] Endoscopist: Ladene Artist , MD Age: 76 Referring MD:  Date of Birth: August 08, 1946 Gender: Female Account #: [id number] Procedure:                Upper GI endoscopy Indications:              Epigastric abdominal pain Medicines:                Monitored Anesthesia Care Procedure:                Pre-Anesthesia Assessment:                           - Prior to the procedure, a History and Physical                            was performed, and patient medications and                            allergies were reviewed. The patient's tolerance of                            previous anesthesia was also reviewed. The risks                            and benefits of the procedure and the sedation                            options and risks were discussed with the patient.                            All questions were answered, and informed consent                            was obtained. Prior Anticoagulants: The patient has                            taken no previous anticoagulant or antiplatelet                            agents. ASA Grade Assessment: III - A patient with                            severe systemic disease. After reviewing the risks                            and benefits, the patient was deemed in                            satisfactory condition to undergo the procedure.                           After obtaining informed consent, the endoscope was  passed under direct vision. Throughout the                            procedure, the patient's blood pressure, pulse, and                            oxygen saturations were monitored continuously. The                            Endoscope was introduced through the mouth, and                            advanced to the second part of duodenum. The upper                            GI endoscopy was  accomplished without difficulty.                            The patient tolerated the procedure well. Scope In: Scope Out: Findings:                 A single 3 mm mucosal nodule with a localized                            distribution was found in the proximal esophagus,                            15 cm from the incisors. Biopsies were taken with a                            cold forceps for histology.                           The exam of the esophagus was otherwise normal.                           A few dispersed medium erosions with no bleeding                            and no stigmata of recent bleeding were found in                            the gastric body and in the gastric antrum.                            Biopsies were taken with a cold forceps for                            histology.                           The exam of the stomach was otherwise normal.  The duodenal bulb and second portion of the                            duodenum were normal. Complications:            No immediate complications. Estimated Blood Loss:     Estimated blood loss was minimal. Impression:               - Mucosal nodule found in the esophagus. Biopsied.                           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 Biopsied.                           - Normal duodenal bulb and second portion of the                            duodenum. Recommendation:           - Patient has a contact number available for                            emergencies. The signs and symptoms of potential                            delayed complications were discussed with the                            patient. Return to normal activities tomorrow.                            Written discharge instructions were provided to the                            patient.                           - Resume previous diet.                            - Continue present medications.                           - Pantoprazole 40 mg po qd, #30, 1 year of refills.                           - Minimize NSAID usage.                           - Await pathology results.                           - Return to GI office in 6 weeks. Ladene Artist, MD 11/18/2021 10:49:57 AM This report has  been signed electronically.

## 2021-11-20 ENCOUNTER — Telehealth: Payer: Self-pay

## 2021-11-20 NOTE — Telephone Encounter (Signed)
°  Follow up Call-  Call back number 11/18/2021  Post procedure Call Back phone  # [phone]  Permission to leave phone message Yes  Some recent data might be hidden    NO ANSWER, MESSAGE LEFT FOR PATIENT.

## 2021-11-20 NOTE — Telephone Encounter (Signed)
Patient has been scheduled for a 6 week follow up with Dr. Fuller Plan on 01/01/22 at 1:30 pm. Pt is aware.

## 2021-11-20 NOTE — Telephone Encounter (Signed)
Second attempt follow up call to pt, no answer.  

## 2021-12-07 ENCOUNTER — Encounter: Payer: Self-pay | Admitting: Gastroenterology

## 2022-01-01 ENCOUNTER — Ambulatory Visit: Payer: Medicare Other | Admitting: Gastroenterology

## 2022-01-01 ENCOUNTER — Ambulatory Visit (INDEPENDENT_AMBULATORY_CARE_PROVIDER_SITE_OTHER): Payer: Medicare Other | Admitting: Gastroenterology

## 2022-01-01 ENCOUNTER — Encounter: Payer: Self-pay | Admitting: Gastroenterology

## 2022-01-01 VITALS — BP 114/60 | HR 69 | Ht 63.0 in | Wt 147.2 lb

## 2022-01-01 DIAGNOSIS — K3189 Other diseases of stomach and duodenum: Secondary | ICD-10-CM

## 2022-01-01 DIAGNOSIS — K219 Gastro-esophageal reflux disease without esophagitis: Secondary | ICD-10-CM | POA: Diagnosis not present

## 2022-01-01 NOTE — Patient Instructions (Signed)
Follow up with your primary care doctor as needed and [place] as needed.  ? ?The [place] [facility] providers would like to encourage you to use [facility] to communicate with providers for non-urgent requests or questions.  Due to long hold times on the telephone, sending your provider a message by [facility] may be a faster and more efficient way to get a response.  Please allow 48 business hours for a response.  Please remember that this is for non-urgent requests.  ? ?Thank you for choosing me and [facility]. ? ?Malcolm T. Dagoberto Ligas., MD., [facility] ? ?

## 2022-01-01 NOTE — Progress Notes (Signed)
? ? ?  History of Present Illness: This is a 76 year old female returning for follow-up of epigastric pain.  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  On daily pantoprazole and with diet modification and her symptoms have substantially improved.  She had 1 episode of epigastric pain that was relieved with dicyclomine.  CT AP as below.  ? ?EGD 12/2021 ?- Mucosal nodule found in the esophagus. Biopsied. 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 Biopsied. ?- Normal duodenal bulb and second portion of the duodenum. ? ?CT AP 11/14/2021 ?1. Large volume of formed stool throughout the colon suggestive of constipation. ?2. Mild wall thickening of the gastric antrum may reflect under distension or focal gastritis. ?3. Asymmetric rectal wall thickening involving the right lateral aspect of the rectum may reflect underdistention and anatomic positioning however underlying mass lesion can not be excluded. Consider correlation with colonoscopy if clinically indicated. ?4. Colonic diverticulosis without findings of acute diverticulitis. ?5. Aortic Atherosclerosis  ? ? ?Current Medications, Allergies, Past Medical History, Past Surgical History, Family History and Social History were reviewed in [employer] record. ? ? ?Physical Exam: ?General: Well developed, well nourished, no acute distress ?Head: Normocephalic and atraumatic ?Eyes: Sclerae anicteric, EOMI ?Ears: Normal auditory acuity ?Mouth: Not examined, mask on during Covid-19 pandemic ?Lungs: Clear throughout to auscultation ?Heart: Regular rate and rhythm; no murmurs, rubs or bruits ?Abdomen: Soft, non tender and non distended. No masses, hepatosplenomegaly or hernias noted. Normal Bowel sounds ?Rectal: Not done ?Musculoskeletal: Symmetrical with no gross deformities  ?Pulses:  Normal pulses noted ?Extremities: No clubbing, cyanosis, edema  or deformities noted ?Neurological: Alert oriented x 4, grossly nonfocal ?Psychological:  Alert and cooperative. Normal mood and affect ? ? ?Assessment and Recommendations: ? ?Erosive gastritis, reactive gastropathy, GERD.  She had several questions and I addressed her questions to her satisfaction.  Continue pantoprazole 40 mg p.o. daily for at least 8 weeks and then may discontinue and assess symptoms.  If they recur then resume pantoprazole 40 mg daily for long-term usage.  Follow antireflux measures avoiding specific foods and beverages that exacerbate symptoms.  Dicyclomine 10 mg p.o. 2 times daily as needed abdominal pain.  Ongoing follow-up with PCP.  GI follow-up as needed. ?

## 2022-03-28 ENCOUNTER — Other Ambulatory Visit: Payer: Self-pay | Admitting: Cardiology

## 2022-04-04 DIAGNOSIS — E785 Hyperlipidemia, unspecified: Secondary | ICD-10-CM | POA: Diagnosis not present

## 2022-04-04 DIAGNOSIS — M25521 Pain in right elbow: Secondary | ICD-10-CM | POA: Diagnosis not present

## 2022-04-04 DIAGNOSIS — X58XXXA Exposure to other specified factors, initial encounter: Secondary | ICD-10-CM | POA: Diagnosis not present

## 2022-04-04 DIAGNOSIS — S42301A Unspecified fracture of shaft of humerus, right arm, initial encounter for closed fracture: Secondary | ICD-10-CM | POA: Diagnosis not present

## 2022-04-04 DIAGNOSIS — I10 Essential (primary) hypertension: Secondary | ICD-10-CM | POA: Diagnosis not present

## 2022-04-04 DIAGNOSIS — M546 Pain in thoracic spine: Secondary | ICD-10-CM | POA: Diagnosis not present

## 2022-04-04 DIAGNOSIS — R079 Chest pain, unspecified: Secondary | ICD-10-CM | POA: Diagnosis not present

## 2022-04-04 DIAGNOSIS — S59901A Unspecified injury of right elbow, initial encounter: Secondary | ICD-10-CM | POA: Diagnosis not present

## 2022-04-07 ENCOUNTER — Telehealth: Payer: Self-pay | Admitting: Cardiology

## 2022-04-07 DIAGNOSIS — M79601 Pain in right arm: Secondary | ICD-10-CM | POA: Diagnosis not present

## 2022-04-07 DIAGNOSIS — S42301A Unspecified fracture of shaft of humerus, right arm, initial encounter for closed fracture: Secondary | ICD-10-CM | POA: Diagnosis not present

## 2022-04-07 NOTE — Telephone Encounter (Signed)
Erskine Squibb called back noting that the patient fell and broke her shoulder and is due for surgery.  She stated the patient has an unusual diagnosis and anesthesia wanted to see the doctor's notes from her last office visit on 04/15/21.  She called HIM and they referred her back to the doctor's office.  Fax [phone].

## 2022-04-09 DIAGNOSIS — S42301A Unspecified fracture of shaft of humerus, right arm, initial encounter for closed fracture: Secondary | ICD-10-CM | POA: Diagnosis not present

## 2022-04-09 NOTE — Telephone Encounter (Signed)
Forwarded as requested via EPIC fax function

## 2022-04-15 DIAGNOSIS — Z09 Encounter for follow-up examination after completed treatment for conditions other than malignant neoplasm: Secondary | ICD-10-CM | POA: Diagnosis not present

## 2022-04-15 DIAGNOSIS — M79601 Pain in right arm: Secondary | ICD-10-CM | POA: Diagnosis not present

## 2022-04-15 DIAGNOSIS — S42301D Unspecified fracture of shaft of humerus, right arm, subsequent encounter for fracture with routine healing: Secondary | ICD-10-CM | POA: Diagnosis not present

## 2022-05-08 DIAGNOSIS — M25521 Pain in right elbow: Secondary | ICD-10-CM | POA: Diagnosis not present

## 2022-05-08 DIAGNOSIS — S42301D Unspecified fracture of shaft of humerus, right arm, subsequent encounter for fracture with routine healing: Secondary | ICD-10-CM | POA: Diagnosis not present

## 2022-05-08 DIAGNOSIS — Z09 Encounter for follow-up examination after completed treatment for conditions other than malignant neoplasm: Secondary | ICD-10-CM | POA: Diagnosis not present

## 2022-05-19 DIAGNOSIS — I10 Essential (primary) hypertension: Secondary | ICD-10-CM | POA: Diagnosis not present

## 2022-05-19 DIAGNOSIS — R634 Abnormal weight loss: Secondary | ICD-10-CM | POA: Diagnosis not present

## 2022-05-19 DIAGNOSIS — E782 Mixed hyperlipidemia: Secondary | ICD-10-CM | POA: Diagnosis not present

## 2022-05-19 DIAGNOSIS — F32A Depression, unspecified: Secondary | ICD-10-CM | POA: Diagnosis not present

## 2022-05-19 DIAGNOSIS — Z853 Personal history of malignant neoplasm of breast: Secondary | ICD-10-CM | POA: Diagnosis not present

## 2022-05-19 DIAGNOSIS — Z6822 Body mass index (BMI) 22.0-22.9, adult: Secondary | ICD-10-CM | POA: Diagnosis not present

## 2022-05-19 DIAGNOSIS — S42309A Unspecified fracture of shaft of humerus, unspecified arm, initial encounter for closed fracture: Secondary | ICD-10-CM | POA: Diagnosis not present

## 2022-05-19 DIAGNOSIS — R413 Other amnesia: Secondary | ICD-10-CM | POA: Diagnosis not present

## 2022-05-19 DIAGNOSIS — K219 Gastro-esophageal reflux disease without esophagitis: Secondary | ICD-10-CM | POA: Diagnosis not present

## 2022-05-19 DIAGNOSIS — I5181 Takotsubo syndrome: Secondary | ICD-10-CM | POA: Diagnosis not present

## 2022-05-19 DIAGNOSIS — K635 Polyp of colon: Secondary | ICD-10-CM | POA: Diagnosis not present

## 2022-05-19 DIAGNOSIS — F419 Anxiety disorder, unspecified: Secondary | ICD-10-CM | POA: Diagnosis not present

## 2022-05-20 DIAGNOSIS — S42301D Unspecified fracture of shaft of humerus, right arm, subsequent encounter for fracture with routine healing: Secondary | ICD-10-CM | POA: Diagnosis not present

## 2022-05-20 DIAGNOSIS — S43301D Subluxation of unspecified parts of right shoulder girdle, subsequent encounter: Secondary | ICD-10-CM | POA: Diagnosis not present

## 2022-05-20 DIAGNOSIS — M25521 Pain in right elbow: Secondary | ICD-10-CM | POA: Diagnosis not present

## 2022-05-20 DIAGNOSIS — M6281 Muscle weakness (generalized): Secondary | ICD-10-CM | POA: Diagnosis not present

## 2022-05-20 DIAGNOSIS — M79621 Pain in right upper arm: Secondary | ICD-10-CM | POA: Diagnosis not present

## 2022-05-22 DIAGNOSIS — S42301D Unspecified fracture of shaft of humerus, right arm, subsequent encounter for fracture with routine healing: Secondary | ICD-10-CM | POA: Diagnosis not present

## 2022-05-22 DIAGNOSIS — S43301D Subluxation of unspecified parts of right shoulder girdle, subsequent encounter: Secondary | ICD-10-CM | POA: Diagnosis not present

## 2022-05-22 DIAGNOSIS — M6281 Muscle weakness (generalized): Secondary | ICD-10-CM | POA: Diagnosis not present

## 2022-05-22 DIAGNOSIS — M79621 Pain in right upper arm: Secondary | ICD-10-CM | POA: Diagnosis not present

## 2022-05-22 DIAGNOSIS — M25521 Pain in right elbow: Secondary | ICD-10-CM | POA: Diagnosis not present

## 2022-05-26 DIAGNOSIS — M25521 Pain in right elbow: Secondary | ICD-10-CM | POA: Diagnosis not present

## 2022-05-26 DIAGNOSIS — S43301D Subluxation of unspecified parts of right shoulder girdle, subsequent encounter: Secondary | ICD-10-CM | POA: Diagnosis not present

## 2022-05-26 DIAGNOSIS — S42301D Unspecified fracture of shaft of humerus, right arm, subsequent encounter for fracture with routine healing: Secondary | ICD-10-CM | POA: Diagnosis not present

## 2022-05-26 DIAGNOSIS — M79621 Pain in right upper arm: Secondary | ICD-10-CM | POA: Diagnosis not present

## 2022-05-26 DIAGNOSIS — M6281 Muscle weakness (generalized): Secondary | ICD-10-CM | POA: Diagnosis not present

## 2022-06-02 DIAGNOSIS — M25521 Pain in right elbow: Secondary | ICD-10-CM | POA: Diagnosis not present

## 2022-06-02 DIAGNOSIS — M79621 Pain in right upper arm: Secondary | ICD-10-CM | POA: Diagnosis not present

## 2022-06-02 DIAGNOSIS — M6281 Muscle weakness (generalized): Secondary | ICD-10-CM | POA: Diagnosis not present

## 2022-06-02 DIAGNOSIS — S43301D Subluxation of unspecified parts of right shoulder girdle, subsequent encounter: Secondary | ICD-10-CM | POA: Diagnosis not present

## 2022-06-02 DIAGNOSIS — S42301D Unspecified fracture of shaft of humerus, right arm, subsequent encounter for fracture with routine healing: Secondary | ICD-10-CM | POA: Diagnosis not present

## 2022-06-09 DIAGNOSIS — M6281 Muscle weakness (generalized): Secondary | ICD-10-CM | POA: Diagnosis not present

## 2022-06-09 DIAGNOSIS — S43301D Subluxation of unspecified parts of right shoulder girdle, subsequent encounter: Secondary | ICD-10-CM | POA: Diagnosis not present

## 2022-06-09 DIAGNOSIS — S42301D Unspecified fracture of shaft of humerus, right arm, subsequent encounter for fracture with routine healing: Secondary | ICD-10-CM | POA: Diagnosis not present

## 2022-06-09 DIAGNOSIS — M79621 Pain in right upper arm: Secondary | ICD-10-CM | POA: Diagnosis not present

## 2022-06-09 DIAGNOSIS — M25521 Pain in right elbow: Secondary | ICD-10-CM | POA: Diagnosis not present

## 2022-06-11 DIAGNOSIS — Z20822 Contact with and (suspected) exposure to covid-19: Secondary | ICD-10-CM | POA: Diagnosis not present

## 2022-06-11 DIAGNOSIS — R051 Acute cough: Secondary | ICD-10-CM | POA: Diagnosis not present

## 2022-06-12 DIAGNOSIS — M25521 Pain in right elbow: Secondary | ICD-10-CM | POA: Diagnosis not present

## 2022-06-12 DIAGNOSIS — S42301D Unspecified fracture of shaft of humerus, right arm, subsequent encounter for fracture with routine healing: Secondary | ICD-10-CM | POA: Diagnosis not present

## 2022-06-16 DIAGNOSIS — R140 Abdominal distension (gaseous): Secondary | ICD-10-CM | POA: Diagnosis not present

## 2022-06-16 DIAGNOSIS — R634 Abnormal weight loss: Secondary | ICD-10-CM | POA: Diagnosis not present

## 2022-06-16 DIAGNOSIS — R1012 Left upper quadrant pain: Secondary | ICD-10-CM | POA: Diagnosis not present

## 2022-06-18 ENCOUNTER — Telehealth: Payer: Self-pay | Admitting: Cardiology

## 2022-06-18 NOTE — Telephone Encounter (Signed)
Patient has moved to [place].

## 2022-06-22 DIAGNOSIS — R1012 Left upper quadrant pain: Secondary | ICD-10-CM | POA: Diagnosis not present

## 2022-06-22 DIAGNOSIS — K8689 Other specified diseases of pancreas: Secondary | ICD-10-CM | POA: Diagnosis not present

## 2022-06-22 DIAGNOSIS — R634 Abnormal weight loss: Secondary | ICD-10-CM | POA: Diagnosis not present

## 2022-06-22 DIAGNOSIS — K5730 Diverticulosis of large intestine without perforation or abscess without bleeding: Secondary | ICD-10-CM | POA: Diagnosis not present

## 2022-06-22 DIAGNOSIS — R935 Abnormal findings on diagnostic imaging of other abdominal regions, including retroperitoneum: Secondary | ICD-10-CM | POA: Diagnosis not present

## 2022-06-22 DIAGNOSIS — N281 Cyst of kidney, acquired: Secondary | ICD-10-CM | POA: Diagnosis not present

## 2022-06-23 DIAGNOSIS — R1012 Left upper quadrant pain: Secondary | ICD-10-CM | POA: Diagnosis not present

## 2022-06-23 DIAGNOSIS — R140 Abdominal distension (gaseous): Secondary | ICD-10-CM | POA: Diagnosis not present

## 2022-06-23 DIAGNOSIS — R634 Abnormal weight loss: Secondary | ICD-10-CM | POA: Diagnosis not present

## 2022-06-23 DIAGNOSIS — K8689 Other specified diseases of pancreas: Secondary | ICD-10-CM | POA: Diagnosis not present

## 2022-06-24 DIAGNOSIS — I429 Cardiomyopathy, unspecified: Secondary | ICD-10-CM | POA: Diagnosis not present

## 2022-06-24 DIAGNOSIS — K838 Other specified diseases of biliary tract: Secondary | ICD-10-CM | POA: Diagnosis not present

## 2022-06-24 DIAGNOSIS — Z853 Personal history of malignant neoplasm of breast: Secondary | ICD-10-CM | POA: Diagnosis not present

## 2022-06-24 DIAGNOSIS — I10 Essential (primary) hypertension: Secondary | ICD-10-CM | POA: Diagnosis not present

## 2022-06-24 DIAGNOSIS — Z79899 Other long term (current) drug therapy: Secondary | ICD-10-CM | POA: Diagnosis not present

## 2022-06-24 DIAGNOSIS — C250 Malignant neoplasm of head of pancreas: Secondary | ICD-10-CM | POA: Diagnosis not present

## 2022-06-24 DIAGNOSIS — Z888 Allergy status to other drugs, medicaments and biological substances status: Secondary | ICD-10-CM | POA: Diagnosis not present

## 2022-06-24 DIAGNOSIS — K8689 Other specified diseases of pancreas: Secondary | ICD-10-CM | POA: Diagnosis not present

## 2022-06-24 DIAGNOSIS — F419 Anxiety disorder, unspecified: Secondary | ICD-10-CM | POA: Diagnosis not present

## 2022-06-30 DIAGNOSIS — I429 Cardiomyopathy, unspecified: Secondary | ICD-10-CM | POA: Diagnosis not present

## 2022-06-30 DIAGNOSIS — K838 Other specified diseases of biliary tract: Secondary | ICD-10-CM | POA: Diagnosis not present

## 2022-06-30 DIAGNOSIS — Z888 Allergy status to other drugs, medicaments and biological substances status: Secondary | ICD-10-CM | POA: Diagnosis not present

## 2022-06-30 DIAGNOSIS — K8689 Other specified diseases of pancreas: Secondary | ICD-10-CM | POA: Diagnosis not present

## 2022-06-30 DIAGNOSIS — C250 Malignant neoplasm of head of pancreas: Secondary | ICD-10-CM | POA: Diagnosis not present

## 2022-06-30 DIAGNOSIS — I10 Essential (primary) hypertension: Secondary | ICD-10-CM | POA: Diagnosis not present

## 2022-06-30 DIAGNOSIS — F419 Anxiety disorder, unspecified: Secondary | ICD-10-CM | POA: Diagnosis not present

## 2022-06-30 DIAGNOSIS — Z79899 Other long term (current) drug therapy: Secondary | ICD-10-CM | POA: Diagnosis not present

## 2022-06-30 DIAGNOSIS — K831 Obstruction of bile duct: Secondary | ICD-10-CM | POA: Diagnosis not present

## 2022-06-30 DIAGNOSIS — Z8616 Personal history of COVID-19: Secondary | ICD-10-CM | POA: Diagnosis not present

## 2022-06-30 DIAGNOSIS — K8050 Calculus of bile duct without cholangitis or cholecystitis without obstruction: Secondary | ICD-10-CM | POA: Diagnosis not present

## 2022-07-01 DIAGNOSIS — C250 Malignant neoplasm of head of pancreas: Secondary | ICD-10-CM | POA: Diagnosis not present

## 2022-07-01 DIAGNOSIS — R599 Enlarged lymph nodes, unspecified: Secondary | ICD-10-CM | POA: Diagnosis not present

## 2022-07-03 DIAGNOSIS — C250 Malignant neoplasm of head of pancreas: Secondary | ICD-10-CM | POA: Diagnosis not present

## 2022-07-03 DIAGNOSIS — R634 Abnormal weight loss: Secondary | ICD-10-CM | POA: Diagnosis not present

## 2022-07-06 DIAGNOSIS — C250 Malignant neoplasm of head of pancreas: Secondary | ICD-10-CM | POA: Diagnosis not present

## 2022-07-06 DIAGNOSIS — I10 Essential (primary) hypertension: Secondary | ICD-10-CM | POA: Diagnosis not present

## 2022-07-06 DIAGNOSIS — Z801 Family history of malignant neoplasm of trachea, bronchus and lung: Secondary | ICD-10-CM | POA: Diagnosis not present

## 2022-07-06 DIAGNOSIS — Z8049 Family history of malignant neoplasm of other genital organs: Secondary | ICD-10-CM | POA: Diagnosis not present

## 2022-07-06 DIAGNOSIS — Z800 Family history of malignant neoplasm of digestive organs: Secondary | ICD-10-CM | POA: Diagnosis not present

## 2022-07-06 DIAGNOSIS — Z803 Family history of malignant neoplasm of breast: Secondary | ICD-10-CM | POA: Diagnosis not present

## 2022-07-08 DIAGNOSIS — C259 Malignant neoplasm of pancreas, unspecified: Secondary | ICD-10-CM | POA: Diagnosis not present

## 2022-07-08 DIAGNOSIS — C250 Malignant neoplasm of head of pancreas: Secondary | ICD-10-CM | POA: Diagnosis not present

## 2022-07-13 DIAGNOSIS — Z5189 Encounter for other specified aftercare: Secondary | ICD-10-CM | POA: Diagnosis not present

## 2022-07-13 DIAGNOSIS — Z803 Family history of malignant neoplasm of breast: Secondary | ICD-10-CM | POA: Diagnosis not present

## 2022-07-13 DIAGNOSIS — Z5111 Encounter for antineoplastic chemotherapy: Secondary | ICD-10-CM | POA: Diagnosis not present

## 2022-07-13 DIAGNOSIS — C250 Malignant neoplasm of head of pancreas: Secondary | ICD-10-CM | POA: Diagnosis not present

## 2022-07-13 DIAGNOSIS — Z79899 Other long term (current) drug therapy: Secondary | ICD-10-CM | POA: Diagnosis not present

## 2022-07-15 DIAGNOSIS — Z79899 Other long term (current) drug therapy: Secondary | ICD-10-CM | POA: Diagnosis not present

## 2022-07-15 DIAGNOSIS — Z5111 Encounter for antineoplastic chemotherapy: Secondary | ICD-10-CM | POA: Diagnosis not present

## 2022-07-15 DIAGNOSIS — Z803 Family history of malignant neoplasm of breast: Secondary | ICD-10-CM | POA: Diagnosis not present

## 2022-07-15 DIAGNOSIS — C250 Malignant neoplasm of head of pancreas: Secondary | ICD-10-CM | POA: Diagnosis not present

## 2022-07-15 DIAGNOSIS — Z5189 Encounter for other specified aftercare: Secondary | ICD-10-CM | POA: Diagnosis not present

## 2022-07-27 DIAGNOSIS — Z803 Family history of malignant neoplasm of breast: Secondary | ICD-10-CM | POA: Diagnosis not present

## 2022-07-27 DIAGNOSIS — C250 Malignant neoplasm of head of pancreas: Secondary | ICD-10-CM | POA: Diagnosis not present

## 2022-07-27 DIAGNOSIS — Z5111 Encounter for antineoplastic chemotherapy: Secondary | ICD-10-CM | POA: Diagnosis not present

## 2022-07-27 DIAGNOSIS — Z5189 Encounter for other specified aftercare: Secondary | ICD-10-CM | POA: Diagnosis not present

## 2022-07-27 DIAGNOSIS — Z79899 Other long term (current) drug therapy: Secondary | ICD-10-CM | POA: Diagnosis not present

## 2022-07-29 DIAGNOSIS — Z5111 Encounter for antineoplastic chemotherapy: Secondary | ICD-10-CM | POA: Diagnosis not present

## 2022-07-29 DIAGNOSIS — Z79899 Other long term (current) drug therapy: Secondary | ICD-10-CM | POA: Diagnosis not present

## 2022-07-29 DIAGNOSIS — Z5189 Encounter for other specified aftercare: Secondary | ICD-10-CM | POA: Diagnosis not present

## 2022-07-29 DIAGNOSIS — C250 Malignant neoplasm of head of pancreas: Secondary | ICD-10-CM | POA: Diagnosis not present

## 2022-07-29 DIAGNOSIS — Z803 Family history of malignant neoplasm of breast: Secondary | ICD-10-CM | POA: Diagnosis not present

## 2022-08-10 DIAGNOSIS — Z5189 Encounter for other specified aftercare: Secondary | ICD-10-CM | POA: Diagnosis not present

## 2022-08-10 DIAGNOSIS — Z5111 Encounter for antineoplastic chemotherapy: Secondary | ICD-10-CM | POA: Diagnosis not present

## 2022-08-10 DIAGNOSIS — Z79899 Other long term (current) drug therapy: Secondary | ICD-10-CM | POA: Diagnosis not present

## 2022-08-10 DIAGNOSIS — C250 Malignant neoplasm of head of pancreas: Secondary | ICD-10-CM | POA: Diagnosis not present

## 2022-08-10 DIAGNOSIS — Z803 Family history of malignant neoplasm of breast: Secondary | ICD-10-CM | POA: Diagnosis not present

## 2022-08-17 DIAGNOSIS — C250 Malignant neoplasm of head of pancreas: Secondary | ICD-10-CM | POA: Diagnosis not present

## 2022-08-17 DIAGNOSIS — Z79899 Other long term (current) drug therapy: Secondary | ICD-10-CM | POA: Diagnosis not present

## 2022-08-17 DIAGNOSIS — Z5111 Encounter for antineoplastic chemotherapy: Secondary | ICD-10-CM | POA: Diagnosis not present

## 2022-08-19 DIAGNOSIS — Z5111 Encounter for antineoplastic chemotherapy: Secondary | ICD-10-CM | POA: Diagnosis not present

## 2022-08-19 DIAGNOSIS — C250 Malignant neoplasm of head of pancreas: Secondary | ICD-10-CM | POA: Diagnosis not present

## 2022-08-19 DIAGNOSIS — Z79899 Other long term (current) drug therapy: Secondary | ICD-10-CM | POA: Diagnosis not present

## 2022-08-31 DIAGNOSIS — C250 Malignant neoplasm of head of pancreas: Secondary | ICD-10-CM | POA: Diagnosis not present

## 2022-08-31 DIAGNOSIS — Z79899 Other long term (current) drug therapy: Secondary | ICD-10-CM | POA: Diagnosis not present

## 2022-08-31 DIAGNOSIS — Z5111 Encounter for antineoplastic chemotherapy: Secondary | ICD-10-CM | POA: Diagnosis not present

## 2022-09-02 DIAGNOSIS — C250 Malignant neoplasm of head of pancreas: Secondary | ICD-10-CM | POA: Diagnosis not present

## 2022-09-02 DIAGNOSIS — Z5111 Encounter for antineoplastic chemotherapy: Secondary | ICD-10-CM | POA: Diagnosis not present

## 2022-09-02 DIAGNOSIS — Z79899 Other long term (current) drug therapy: Secondary | ICD-10-CM | POA: Diagnosis not present

## 2022-09-14 DIAGNOSIS — Z79899 Other long term (current) drug therapy: Secondary | ICD-10-CM | POA: Diagnosis not present

## 2022-09-14 DIAGNOSIS — R600 Localized edema: Secondary | ICD-10-CM | POA: Diagnosis not present

## 2022-09-14 DIAGNOSIS — C250 Malignant neoplasm of head of pancreas: Secondary | ICD-10-CM | POA: Diagnosis not present

## 2022-09-14 DIAGNOSIS — Z5111 Encounter for antineoplastic chemotherapy: Secondary | ICD-10-CM | POA: Diagnosis not present

## 2022-09-16 DIAGNOSIS — C250 Malignant neoplasm of head of pancreas: Secondary | ICD-10-CM | POA: Diagnosis not present

## 2022-09-16 DIAGNOSIS — R600 Localized edema: Secondary | ICD-10-CM | POA: Diagnosis not present

## 2022-09-21 DIAGNOSIS — Z79899 Other long term (current) drug therapy: Secondary | ICD-10-CM | POA: Diagnosis not present

## 2022-09-21 DIAGNOSIS — C250 Malignant neoplasm of head of pancreas: Secondary | ICD-10-CM | POA: Diagnosis not present

## 2022-09-21 DIAGNOSIS — R600 Localized edema: Secondary | ICD-10-CM | POA: Diagnosis not present

## 2022-09-21 DIAGNOSIS — Z5111 Encounter for antineoplastic chemotherapy: Secondary | ICD-10-CM | POA: Diagnosis not present

## 2022-09-28 DIAGNOSIS — C250 Malignant neoplasm of head of pancreas: Secondary | ICD-10-CM | POA: Diagnosis not present

## 2022-09-28 DIAGNOSIS — R600 Localized edema: Secondary | ICD-10-CM | POA: Diagnosis not present

## 2022-09-28 DIAGNOSIS — Z79899 Other long term (current) drug therapy: Secondary | ICD-10-CM | POA: Diagnosis not present

## 2022-09-28 DIAGNOSIS — Z5111 Encounter for antineoplastic chemotherapy: Secondary | ICD-10-CM | POA: Diagnosis not present

## 2022-10-07 DIAGNOSIS — Z23 Encounter for immunization: Secondary | ICD-10-CM | POA: Diagnosis not present

## 2022-10-07 DIAGNOSIS — Z79899 Other long term (current) drug therapy: Secondary | ICD-10-CM | POA: Diagnosis not present

## 2022-10-07 DIAGNOSIS — R600 Localized edema: Secondary | ICD-10-CM | POA: Diagnosis not present

## 2022-10-07 DIAGNOSIS — C250 Malignant neoplasm of head of pancreas: Secondary | ICD-10-CM | POA: Diagnosis not present

## 2022-10-07 DIAGNOSIS — Z5111 Encounter for antineoplastic chemotherapy: Secondary | ICD-10-CM | POA: Diagnosis not present

## 2023-05-27 ENCOUNTER — Other Ambulatory Visit: Payer: Self-pay | Admitting: Cardiology

## 2023-07-13 DEATH — deceased
